# Patient Record
Sex: Male | Born: 1945 | Race: White | Hispanic: No | Marital: Married | Smoking: Former smoker
Health system: Southern US, Community
[De-identification: ages and names within clinical notes are randomized; demographics above are authoritative.]

## PROBLEM LIST (undated history)

## (undated) ENCOUNTER — Emergency Department (HOSPITAL_BASED_OUTPATIENT_CLINIC_OR_DEPARTMENT_OTHER): Admission: EM | Source: Home / Self Care

## (undated) DIAGNOSIS — G709 Myoneural disorder, unspecified: Secondary | ICD-10-CM

## (undated) DIAGNOSIS — M1A9XX0 Chronic gout, unspecified, without tophus (tophi): Secondary | ICD-10-CM

## (undated) DIAGNOSIS — C4490 Unspecified malignant neoplasm of skin, unspecified: Secondary | ICD-10-CM

## (undated) DIAGNOSIS — E079 Disorder of thyroid, unspecified: Secondary | ICD-10-CM

## (undated) DIAGNOSIS — Z974 Presence of external hearing-aid: Secondary | ICD-10-CM

## (undated) DIAGNOSIS — K429 Umbilical hernia without obstruction or gangrene: Secondary | ICD-10-CM

## (undated) DIAGNOSIS — E785 Hyperlipidemia, unspecified: Secondary | ICD-10-CM

## (undated) DIAGNOSIS — H9190 Unspecified hearing loss, unspecified ear: Secondary | ICD-10-CM

## (undated) DIAGNOSIS — N289 Disorder of kidney and ureter, unspecified: Secondary | ICD-10-CM

## (undated) DIAGNOSIS — R569 Unspecified convulsions: Secondary | ICD-10-CM

## (undated) DIAGNOSIS — K219 Gastro-esophageal reflux disease without esophagitis: Secondary | ICD-10-CM

## (undated) DIAGNOSIS — N2581 Secondary hyperparathyroidism of renal origin: Secondary | ICD-10-CM

## (undated) DIAGNOSIS — E291 Testicular hypofunction: Secondary | ICD-10-CM

## (undated) DIAGNOSIS — N183 Chronic kidney disease, stage 3 unspecified: Secondary | ICD-10-CM

## (undated) DIAGNOSIS — Z7901 Long term (current) use of anticoagulants: Secondary | ICD-10-CM

## (undated) DIAGNOSIS — Z973 Presence of spectacles and contact lenses: Secondary | ICD-10-CM

## (undated) DIAGNOSIS — I350 Nonrheumatic aortic (valve) stenosis: Secondary | ICD-10-CM

## (undated) DIAGNOSIS — M1990 Unspecified osteoarthritis, unspecified site: Secondary | ICD-10-CM

## (undated) DIAGNOSIS — I10 Essential (primary) hypertension: Secondary | ICD-10-CM

## (undated) DIAGNOSIS — E039 Hypothyroidism, unspecified: Secondary | ICD-10-CM

## (undated) DIAGNOSIS — C61 Malignant neoplasm of prostate: Secondary | ICD-10-CM

## (undated) DIAGNOSIS — E119 Type 2 diabetes mellitus without complications: Secondary | ICD-10-CM

## (undated) DIAGNOSIS — G4730 Sleep apnea, unspecified: Secondary | ICD-10-CM

## (undated) DIAGNOSIS — E038 Other specified hypothyroidism: Secondary | ICD-10-CM

## (undated) DIAGNOSIS — T7840XA Allergy, unspecified, initial encounter: Secondary | ICD-10-CM

## (undated) DIAGNOSIS — R011 Cardiac murmur, unspecified: Secondary | ICD-10-CM

## (undated) DIAGNOSIS — C801 Malignant (primary) neoplasm, unspecified: Secondary | ICD-10-CM

## (undated) DIAGNOSIS — Z85828 Personal history of other malignant neoplasm of skin: Secondary | ICD-10-CM

## (undated) DIAGNOSIS — G4733 Obstructive sleep apnea (adult) (pediatric): Secondary | ICD-10-CM

## (undated) DIAGNOSIS — D631 Anemia in chronic kidney disease: Secondary | ICD-10-CM

## (undated) DIAGNOSIS — E063 Autoimmune thyroiditis: Secondary | ICD-10-CM

## (undated) DIAGNOSIS — I491 Atrial premature depolarization: Secondary | ICD-10-CM

## (undated) DIAGNOSIS — Z8709 Personal history of other diseases of the respiratory system: Secondary | ICD-10-CM

## (undated) DIAGNOSIS — S76119A Strain of unspecified quadriceps muscle, fascia and tendon, initial encounter: Principal | ICD-10-CM

## (undated) DIAGNOSIS — J189 Pneumonia, unspecified organism: Secondary | ICD-10-CM

## (undated) DIAGNOSIS — M109 Gout, unspecified: Secondary | ICD-10-CM

## (undated) HISTORY — DX: Obstructive sleep apnea (adult) (pediatric): G47.33

## (undated) HISTORY — DX: Sleep apnea, unspecified: G47.30

## (undated) HISTORY — DX: Malignant (primary) neoplasm, unspecified: C80.1

## (undated) HISTORY — DX: Allergy, unspecified, initial encounter: T78.40XA

## (undated) HISTORY — PX: TONSILLECTOMY: SUR1361

## (undated) HISTORY — PX: KNEE SURGERY: SHX244

## (undated) HISTORY — DX: Type 2 diabetes mellitus without complications: E11.9

## (undated) HISTORY — DX: Myoneural disorder, unspecified: G70.9

## (undated) HISTORY — PX: COLONOSCOPY: SHX174

## (undated) HISTORY — DX: Strain of unspecified quadriceps muscle, fascia and tendon, initial encounter: S76.119A

## (undated) HISTORY — DX: Unspecified osteoarthritis, unspecified site: M19.90

## (undated) HISTORY — DX: Cardiac murmur, unspecified: R01.1

## (undated) HISTORY — DX: Personal history of other malignant neoplasm of skin: Z85.828

## (undated) HISTORY — PX: PROSTATE BIOPSY: SHX241

## (undated) HISTORY — PX: KNEE ARTHROPLASTY: SHX992

---
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
## 1968-05-17 DIAGNOSIS — Z8619 Personal history of other infectious and parasitic diseases: Secondary | ICD-10-CM

## 1968-05-17 HISTORY — DX: Personal history of other infectious and parasitic diseases: Z86.19

## 1997-05-17 HISTORY — PX: KNEE ARTHROSCOPY: SUR90

## 1998-05-17 DIAGNOSIS — Z87898 Personal history of other specified conditions: Secondary | ICD-10-CM

## 1998-05-17 DIAGNOSIS — R569 Unspecified convulsions: Secondary | ICD-10-CM

## 1998-05-17 HISTORY — DX: Personal history of other specified conditions: Z87.898

## 1998-05-17 HISTORY — DX: Unspecified convulsions: R56.9

## 2005-01-23 ENCOUNTER — Emergency Department (HOSPITAL_COMMUNITY): Admission: EM | Admit: 2005-01-23 | Discharge: 2005-01-23 | Payer: Self-pay | Admitting: Emergency Medicine

## 2005-02-04 ENCOUNTER — Encounter: Admission: RE | Admit: 2005-02-04 | Discharge: 2005-02-04 | Payer: Self-pay | Admitting: Nephrology

## 2005-05-17 DIAGNOSIS — Z9889 Other specified postprocedural states: Secondary | ICD-10-CM

## 2005-05-17 DIAGNOSIS — Z85828 Personal history of other malignant neoplasm of skin: Secondary | ICD-10-CM

## 2005-05-17 HISTORY — DX: Personal history of other malignant neoplasm of skin: Z98.890

## 2005-05-17 HISTORY — DX: Personal history of other malignant neoplasm of skin: Z85.828

## 2005-06-03 ENCOUNTER — Emergency Department (HOSPITAL_COMMUNITY): Admission: EM | Admit: 2005-06-03 | Discharge: 2005-06-03 | Payer: Self-pay | Admitting: Family Medicine

## 2007-09-13 ENCOUNTER — Ambulatory Visit: Payer: Self-pay | Admitting: Internal Medicine

## 2007-10-19 ENCOUNTER — Telehealth (INDEPENDENT_AMBULATORY_CARE_PROVIDER_SITE_OTHER): Payer: Self-pay | Admitting: *Deleted

## 2007-10-30 ENCOUNTER — Telehealth: Payer: Self-pay | Admitting: Internal Medicine

## 2007-10-31 ENCOUNTER — Telehealth (INDEPENDENT_AMBULATORY_CARE_PROVIDER_SITE_OTHER): Payer: Self-pay | Admitting: *Deleted

## 2007-12-01 ENCOUNTER — Ambulatory Visit: Payer: Self-pay | Admitting: Internal Medicine

## 2008-02-06 ENCOUNTER — Encounter: Admission: RE | Admit: 2008-02-06 | Discharge: 2008-02-06 | Payer: Self-pay | Admitting: "Endocrinology

## 2008-10-15 ENCOUNTER — Ambulatory Visit: Payer: Self-pay | Admitting: Internal Medicine

## 2008-10-25 ENCOUNTER — Ambulatory Visit: Payer: Self-pay | Admitting: Internal Medicine

## 2008-10-25 ENCOUNTER — Encounter: Payer: Self-pay | Admitting: Internal Medicine

## 2008-10-28 ENCOUNTER — Encounter: Payer: Self-pay | Admitting: Internal Medicine

## 2009-03-26 ENCOUNTER — Ambulatory Visit: Payer: Self-pay | Admitting: Vascular Surgery

## 2009-06-19 ENCOUNTER — Encounter: Admission: RE | Admit: 2009-06-19 | Discharge: 2009-06-19 | Payer: Self-pay | Admitting: Endocrinology

## 2009-10-01 ENCOUNTER — Encounter: Admission: RE | Admit: 2009-10-01 | Discharge: 2009-10-01 | Payer: Self-pay | Admitting: "Endocrinology

## 2009-10-03 ENCOUNTER — Emergency Department (HOSPITAL_COMMUNITY): Admission: EM | Admit: 2009-10-03 | Discharge: 2009-10-03 | Payer: Self-pay | Admitting: Family Medicine

## 2010-06-07 ENCOUNTER — Encounter: Payer: Self-pay | Admitting: Endocrinology

## 2010-08-03 LAB — DIFFERENTIAL
Basophils Absolute: 0 10*3/uL (ref 0.0–0.1)
Eosinophils Absolute: 0.1 10*3/uL (ref 0.0–0.7)
Eosinophils Relative: 1 % (ref 0–5)
Lymphocytes Relative: 30 % (ref 12–46)
Monocytes Absolute: 0.5 10*3/uL (ref 0.1–1.0)
Monocytes Relative: 7 % (ref 3–12)
Neutro Abs: 4.8 10*3/uL (ref 1.7–7.7)

## 2010-08-03 LAB — COMPREHENSIVE METABOLIC PANEL
AST: 24 U/L (ref 0–37)
Albumin: 4.1 g/dL (ref 3.5–5.2)
Alkaline Phosphatase: 47 U/L (ref 39–117)
Calcium: 9.1 mg/dL (ref 8.4–10.5)
GFR calc Af Amer: 53 mL/min — ABNORMAL LOW (ref >60)
Glucose, Bld: 103 mg/dL — ABNORMAL HIGH (ref 70–99)
Sodium: 138 mEq/L (ref 135–145)
Total Bilirubin: 0.6 mg/dL (ref 0.3–1.2)
Total Protein: 6.8 g/dL (ref 6.0–8.3)

## 2010-08-03 LAB — URINALYSIS, ROUTINE W REFLEX MICROSCOPIC
Bilirubin Urine: NEGATIVE
Glucose, UA: NEGATIVE mg/dL
Hgb urine dipstick: NEGATIVE
Protein, ur: NEGATIVE mg/dL
Urobilinogen, UA: 0.2 mg/dL (ref 0.0–1.0)
pH: 6.5 (ref 5.0–8.0)

## 2010-08-03 LAB — CBC
HCT: 48.5 % (ref 39.0–52.0)
Hemoglobin: 16.1 g/dL (ref 13.0–17.0)
MCHC: 33.3 g/dL (ref 30.0–36.0)
MCV: 96.2 fL (ref 78.0–100.0)

## 2010-08-03 LAB — GLUCOSE, CAPILLARY

## 2010-08-24 LAB — GLUCOSE, CAPILLARY
Glucose-Capillary: 101 mg/dL — ABNORMAL HIGH (ref 70–99)
Glucose-Capillary: 81 mg/dL (ref 70–99)

## 2010-09-29 NOTE — Procedures (Signed)
DUPLEX DEEP VENOUS EXAM - LOWER EXTREMITY   INDICATION:  Left lower extremity pain and swelling.   HISTORY:  Edema:  Yes.  Trauma/Surgery:  No.  Pain:  Yes.  PE:  No.  Previous DVT:  No.  Anticoagulants:  81 mg aspirin.  Other:   DUPLEX EXAM:                CFV   SFV   PopV  PTV    GSV                R  L  R  L  R  L  R   L  R  L  Thrombosis    0  0     0     0      0     0  Spontaneous   +  +     +     +      +     +  Phasic        +  +     +     +      +     +  Augmentation  +  +     +     +      +     +  Compressible  +  +     +     +      +     +  Competent     +  +     +     +      +     +   Legend:  + - yes  o - no  p - partial  D - decreased   IMPRESSION:  1. No evidence of deep venous thrombosis noted in the left leg.  2. Notified Dr. Casimiro Needle Altheimer with the results.    _____________________________  Janetta Hora Fields, MD   MG/MEDQ  D:  03/26/2009  T:  03/27/2009  Job:  [id number]

## 2010-09-29 NOTE — Assessment & Plan Note (Signed)
[facility]                         GASTROENTEROLOGY OFFICE NOTE   NAME:[name]                    MRN:          [id number]  DATE:09/13/2007                            DOB:          10-28-1945    REFERRING PHYSICIAN:  Veverly Fells. Altheimer, M.D.   REASON FOR CONSULTATION:  Screening colonoscopy.   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  He has a  history of hypertension, type II diabetes mellitus, hyperlipidemia,  hypothyroidism, osteoarthritis and mild renal insufficiency.  GI history  is remarkable for gastroesophageal reflux disease and remote peptic  ulcer disease, for which he is on Nexium.  He reports to me having had a  routine colonoscopy in 1999.  No abnormalities to report.  He is  interested in routine follow up screening.  He is accompanied today by  his wife, [name].  She has concerns over alternating bowel habits which  have occured over the past 5-6 years.  Specifically, three to four times  per week he may experience postprandial urgency with loose stools.  Occasional mild bloating.  Thereafter, he may go several days without  defecating.  He denies abdominal pain, melena, hematochezia or weight  loss.  On Nexium, no true dyspeptic symptoms are reported.  No  dysphagia.  He apparently had a prior upper endoscopy which was also  negative.  In particular, no Barrett's esophagus.  He has not been  tested for celiac sprue.   ALLERGIES:  1. ZANTAC, RESULTING IN DYSTONIC REACTION.  2. NONSTEROIDAL ANTIINFLAMMATORY DRUGS, RESULTING IN GASTROINTESTINAL      UPSET.   CURRENT MEDICATIONS:  1. Nexium 40 mg daily.  2. Synthroid 137 mcg daily.  3. Lipitor 80 mg daily.  4. Trilipix 130 mg daily.  5. Zetia 10 mg daily.  6. Byetta 5 mcg b.i.d.  7. Janumet 5/500 mg b.i.d.  8. AndroGel pump six times daily.  9. Allopurinol 300 mg daily.  10.Rogaine 5% b.i.d.  11.Micardis/HCTZ 40/12.5 mg daily.  12.Mylanta p.r.n.  13.Gaviscon p.r.n.  14.Tylenol arthritis p.r.n.   PAST MEDICAL HISTORY:  1. Hypertension.  2. Type II diabetes mellitus.  3. Hyperlipidemia.  4. Hypothyroidism.  5. Osteoarthritis.  6. Renal insufficiency.  7. Basal cell carcinoma of the nose status post MOHS surgery in 2007.  8. History of epididymitis.  9. Prostatitis.  10.Benign prostatic hypertrophy.  11.Cervical neuritis.  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  15.Hyperuricemia.  16.Presbyopia.   PAST SURGICAL HISTORY:  1. MOHS surgery for basal cell carcinoma.  2. History of left knee arthroscopy.  3. Facial reconstruction due to trauma in 1965.  4. Sustained a shrapnel wound to the right side of the face while in      [place] in 1970.   FAMILY HISTORY:  Father with chronic renal insufficiency.  Two maternal  uncles with type 2 diabetes.  Mother died of myocardial infarction.  No  family history of gastrointestinal malignancy.   SOCIAL HISTORY:  The patient is married.  Two sons by his previous  marriage and  2 stepsons, as well as 4 daughters-in-law and 5  grandchildren.  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  Incidentally, he cares for my daughter  regarding her diabetes.  He smoked remotely, but has not since 1974.  He  consumes 1-2 alcoholic beverages per day.   REVIEW OF SYSTEMS:  Per diagnostic evaluation form.  The patient  provided a history sheet.   PHYSICAL EXAMINATION:  GENERAL:  A well-appearing male in no acute  distress.  VITAL SIGNS:  Blood pressure is 120/68, heart rate of 60 and regular,  respirations 16 and unlabored.  His weight is 180.6 pounds.  He is 5  feet, 5 inches in height.  HEENT:  Sclerae are anicteric.  Conjunctivae are pink.  Oral mucosa  intact.  NECK:  Supple with normal thyroid.  No adenopathy.  LUNGS:  Clear.   HEART:  Regular without obvious murmur.  ABDOMEN:  Soft and nontender with good bowel sounds.  No organomegaly or  mass.  EXTREMITIES:  Without edema.   IMPRESSION:  1. Screening colonoscopy.  He is an appropriate candidate without      contraindication.  The nature of the procedure, as well as the      risks, benefits, and alternatives were reviewed.  He understood and      agreed to proceed.  2. Chronic history of mild alternating bowel habits, most consistent      with mild irritable bowel syndrome.  3. Gastroesophageal reflux disease with negative prior endoscopy.  4. Remote history of peptic ulcer disease.  5. Multiple general medical problems as outlined above, and under the      care of Dr. Leslie Dales.   RECOMMENDATIONS:  1. Continue proton pump inhibitor therapy.  2. Empiric trial of the probiotic Align, one daily for two weeks, to      see if this helps with bowel symptoms.  3. Schedule colonoscopy at the patient's convenience.  He will adjust      his diabetic medications to avoid hypoglycemia.     [name]. [name], MD  Electronically Signed    JNP/MedQ  DD: 09/13/2007  DT: 09/13/2007  Job #: [id number]   cc:   Veverly Fells. Altheimer, M.D.

## 2011-06-01 ENCOUNTER — Emergency Department (HOSPITAL_COMMUNITY)
Admission: EM | Admit: 2011-06-01 | Discharge: 2011-06-02 | Disposition: A | Discharge status: Home / Self Care | Payer: 59 | Attending: Emergency Medicine | Admitting: Emergency Medicine

## 2011-06-01 ENCOUNTER — Emergency Department (HOSPITAL_COMMUNITY): Payer: 59

## 2011-06-01 ENCOUNTER — Encounter (HOSPITAL_COMMUNITY): Payer: Self-pay | Admitting: *Deleted

## 2011-06-01 DIAGNOSIS — R1031 Right lower quadrant pain: Secondary | ICD-10-CM | POA: Insufficient documentation

## 2011-06-01 DIAGNOSIS — Z79899 Other long term (current) drug therapy: Secondary | ICD-10-CM | POA: Insufficient documentation

## 2011-06-01 DIAGNOSIS — M109 Gout, unspecified: Secondary | ICD-10-CM | POA: Insufficient documentation

## 2011-06-01 DIAGNOSIS — N289 Disorder of kidney and ureter, unspecified: Secondary | ICD-10-CM | POA: Insufficient documentation

## 2011-06-01 DIAGNOSIS — Z7982 Long term (current) use of aspirin: Secondary | ICD-10-CM | POA: Insufficient documentation

## 2011-06-01 DIAGNOSIS — I10 Essential (primary) hypertension: Secondary | ICD-10-CM | POA: Insufficient documentation

## 2011-06-01 DIAGNOSIS — R110 Nausea: Secondary | ICD-10-CM | POA: Insufficient documentation

## 2011-06-01 DIAGNOSIS — K219 Gastro-esophageal reflux disease without esophagitis: Secondary | ICD-10-CM | POA: Insufficient documentation

## 2011-06-01 DIAGNOSIS — E785 Hyperlipidemia, unspecified: Secondary | ICD-10-CM | POA: Insufficient documentation

## 2011-06-01 DIAGNOSIS — N451 Epididymitis: Secondary | ICD-10-CM

## 2011-06-01 DIAGNOSIS — E079 Disorder of thyroid, unspecified: Secondary | ICD-10-CM | POA: Insufficient documentation

## 2011-06-01 DIAGNOSIS — N453 Epididymo-orchitis: Secondary | ICD-10-CM | POA: Insufficient documentation

## 2011-06-01 DIAGNOSIS — E119 Type 2 diabetes mellitus without complications: Secondary | ICD-10-CM | POA: Insufficient documentation

## 2011-06-01 HISTORY — DX: Essential (primary) hypertension: I10

## 2011-06-01 HISTORY — DX: Disorder of thyroid, unspecified: E07.9

## 2011-06-01 HISTORY — DX: Hyperlipidemia, unspecified: E78.5

## 2011-06-01 HISTORY — DX: Unspecified convulsions: R56.9

## 2011-06-01 HISTORY — DX: Disorder of kidney and ureter, unspecified: N28.9

## 2011-06-01 HISTORY — DX: Gastro-esophageal reflux disease without esophagitis: K21.9

## 2011-06-01 LAB — CBC
HCT: 46.6 % (ref 39.0–52.0)
MCH: 31.4 pg (ref 26.0–34.0)
MCV: 91 fL (ref 78.0–100.0)
Platelets: 263 10*3/uL (ref 150–400)
RDW: 14 % (ref 11.5–15.5)
WBC: 9.3 10*3/uL (ref 4.0–10.5)

## 2011-06-01 LAB — COMPREHENSIVE METABOLIC PANEL
ALT: 23 U/L (ref 0–53)
Albumin: 4.1 g/dL (ref 3.5–5.2)
BUN: 28 mg/dL — ABNORMAL HIGH (ref 6–23)
CO2: 25 mEq/L (ref 19–32)
Chloride: 100 mEq/L (ref 96–112)
Creatinine, Ser: 1.44 mg/dL — ABNORMAL HIGH (ref 0.50–1.35)
Glucose, Bld: 90 mg/dL (ref 70–99)
Potassium: 3.6 mEq/L (ref 3.5–5.1)
Sodium: 136 mEq/L (ref 135–145)

## 2011-06-01 LAB — URINALYSIS, ROUTINE W REFLEX MICROSCOPIC
Glucose, UA: NEGATIVE mg/dL
Protein, ur: NEGATIVE mg/dL
Specific Gravity, Urine: 1.022 (ref 1.005–1.030)
Urobilinogen, UA: 0.2 mg/dL (ref 0.0–1.0)
pH: 6.5 (ref 5.0–8.0)

## 2011-06-01 LAB — LIPASE, BLOOD: Lipase: 46 U/L (ref 11–59)

## 2011-06-01 LAB — DIFFERENTIAL
Eosinophils Absolute: 0.1 10*3/uL (ref 0.0–0.7)
Neutrophils Relative %: 63 % (ref 43–77)

## 2011-06-01 MED ORDER — SODIUM CHLORIDE 0.9 % IV BOLUS (SEPSIS)
1000.0000 mL | Freq: Once | INTRAVENOUS | Status: AC
  Administered 2011-06-01: 1000 mL via INTRAVENOUS

## 2011-06-01 MED ORDER — SODIUM CHLORIDE 0.9 % IV SOLN: INTRAVENOUS | Status: DC

## 2011-06-01 MED ORDER — MORPHINE SULFATE 4 MG/ML IJ SOLN: 4.0000 mg | Freq: Once | INTRAMUSCULAR | Status: DC

## 2011-06-01 MED ORDER — ONDANSETRON HCL 4 MG/2ML IJ SOLN
4.0000 mg | Freq: Once | INTRAMUSCULAR | Status: AC
  Administered 2011-06-01: 4 mg via INTRAVENOUS

## 2011-06-01 NOTE — ED Notes (Signed)
MD at bedside. Dr. Knapp at bedside.  

## 2011-06-01 NOTE — ED Notes (Signed)
Bed:WA07<BR> Expected date:<BR> Expected time:<BR> Means of arrival:<BR> Comments:<BR> Hold for triage

## 2011-06-01 NOTE — ED Notes (Signed)
Made patient aware that we needed a urine sample and he stated that he is unable to go at this time he will call when he is able to go.

## 2011-06-01 NOTE — ED Provider Notes (Signed)
History     CSN: [id number]  Arrival date & time 06/01/11  2055   First MD Initiated Contact with Patient 06/01/11 2117      Chief Complaint  Patient presents with  . Abdominal Pain    (Consider location/radiation/quality/duration/timing/severity/associated sxs/prior treatment) HPI  Patient relates he's been working long hours because of a new computer system at work and has been working up to 18 hours a day. He relates about 6:30 this evening he was at work and he started getting a right lower quadrant pain that he describes as a twinge it would last a few seconds and come and go. He had nausea without vomiting. He relates a few times the pain got so intense that made him fall down. He denies flank pain, dysuria, fever. He states he's had epididymitis and prostatitis before but he states this pain is in a new location the he's never had before. He denies any change in activities such as heavy lifting or any change in his diet. He relates he's never had any surgical procedures on his abdomen.  PCP Dr. Odelia Gage  Past Medical History  Diagnosis Date  . Thyroid disease   . Hyperlipemia   . Hypertension   . Diabetes mellitus   . Gout   . GERD (gastroesophageal reflux disease)   . Seizures   . Renal disorder     Past Surgical History  Procedure Date  . Knee surgery   . Facial fracture surgery     History reviewed. No pertinent family history.  History  Substance Use Topics  . Smoking status: Never Smoker   . Smokeless tobacco: Not on file  . Alcohol Use: Yes   lives with spouse Patient is a practicing physician    Review of Systems  All other systems reviewed and are negative.    Allergies  Nsaids and Ranitidine hcl  Home Medications   Current Outpatient Rx  Name Route Sig Dispense Refill  . ACETAMINOPHEN 500 MG PO TABS Oral Take 1,000 mg by mouth daily. For pain.    Marland Kitchen ALLOPURINOL 300 MG PO TABS Oral Take 300 mg by mouth daily.    . ASPIRIN EC 81 MG PO TBEC  Oral Take 81 mg by mouth daily.    . ATORVASTATIN CALCIUM 80 MG PO TABS Oral Take 80 mg by mouth daily.    . CHOLINE FENOFIBRATE 135 MG PO CPDR Oral Take 135 mg by mouth daily.    Marland Kitchen ESOMEPRAZOLE MAGNESIUM 40 MG PO CPDR Oral Take 40 mg by mouth daily before breakfast.    . EXENATIDE 5 MCG/0.02ML [place] SOLN Subcutaneous Inject 5 mcg into the skin daily.    Marland Kitchen EZETIMIBE 10 MG PO TABS Oral Take 10 mg by mouth daily.    Marland Kitchen LEVOTHYROXINE SODIUM 137 MCG PO TABS Oral Take 137 mcg by mouth daily.    Marland Kitchen SITAGLIPTIN-METFORMIN HCL 50-500 MG PO TABS Oral Take 1 tablet by mouth daily.    . TELMISARTAN-HCTZ 40-12.5 MG PO TABS Oral Take 1 tablet by mouth daily.    . TESTOSTERONE 20.25 MG/ACT (1.62%) TD GEL Transdermal Place 8 application onto the skin daily.    Marland Kitchen VITAMIN D (ERGOCALCIFEROL) 50000 UNITS PO CAPS Oral Take 50,000 Units by mouth every 7 (seven) days. Taken on Sunday.      BP 141/77  Pulse 67  Temp(Src) 98.6 F (37 C) (Oral)  Resp 20  SpO2 99% Vital signs normal    Physical Exam  Nursing note and vitals reviewed.  Constitutional: He is oriented to person, place, and time. He appears well-developed and well-nourished.  Non-toxic appearance. He does not appear ill. No distress.  HENT:  Head: Normocephalic and atraumatic.  Right Ear: External ear normal.  Left Ear: External ear normal.  Nose: Nose normal. No mucosal edema or rhinorrhea.  Mouth/Throat: Oropharynx is clear and moist and mucous membranes are normal. No dental abscesses or uvula swelling.  Eyes: Conjunctivae and EOM are normal. Pupils are equal, round, and reactive to light.  Neck: Normal range of motion and full passive range of motion without pain. Neck supple.  Cardiovascular: Normal rate, regular rhythm and normal heart sounds.  Exam reveals no gallop and no friction rub.   No murmur heard. Pulmonary/Chest: Effort normal and breath sounds normal. No respiratory distress. He has no wheezes. He has no rhonchi. He has no rales. He  exhibits no tenderness and no crepitus.  Abdominal: Soft. Normal appearance and bowel sounds are normal. He exhibits no distension. There is no tenderness. There is no rebound and no guarding.         Area of pain indicated that he denies pain to palpation  Genitourinary:       Patient has some tenderness over the right epididymis but he states that is not his worse pain.  Musculoskeletal: Normal range of motion. He exhibits no edema and no tenderness.       Moves all extremities well.   Neurological: He is alert and oriented to person, place, and time. He has normal strength. No cranial nerve deficit.  Skin: Skin is warm, dry and intact. No rash noted. No erythema. No pallor.  Psychiatric: He has a normal mood and affect. His speech is normal and behavior is normal. His mood appears not anxious.    ED Course  Procedures (including critical care time)  IV fluids and IV pain and nausea medicine were written however patient did not want to get pain medicine at this time.  Peliminary [place] report, no acute changes, cyst on left and ? Scar tissue from epididymus. Pt doesn't want to wait for official reading by radiologist.  Results for orders placed during the hospital encounter of 06/01/11  CBC      Component Value Range   WBC 9.3  4.0 - 10.5 (K/uL)   RBC 5.12  4.22 - 5.81 (MIL/uL)   Hemoglobin 16.1  13.0 - 17.0 (g/dL)   HCT 45.4  09.8 - 11.9 (%)   MCV 91.0  78.0 - 100.0 (fL)   MCH 31.4  26.0 - 34.0 (pg)   MCHC 34.5  30.0 - 36.0 (g/dL)   RDW 14.7  82.9 - 56.2 (%)   Platelets 263  150 - 400 (K/uL)  DIFFERENTIAL      Component Value Range   Neutrophils Relative 63  43 - 77 (%)   Neutro Abs 5.9  1.7 - 7.7 (K/uL)   Lymphocytes Relative 30  12 - 46 (%)   Lymphs Abs 2.8  0.7 - 4.0 (K/uL)   Monocytes Relative 6  3 - 12 (%)   Monocytes Absolute 0.5  0.1 - 1.0 (K/uL)   Eosinophils Relative 1  0 - 5 (%)   Eosinophils Absolute 0.1  0.0 - 0.7 (K/uL)   Basophils Relative 0  0 - 1 (%)    Basophils Absolute 0.0  0.0 - 0.1 (K/uL)  COMPREHENSIVE METABOLIC PANEL      Component Value Range   Sodium 136  135 - 145 (mEq/L)  Potassium 3.6  3.5 - 5.1 (mEq/L)   Chloride 100  96 - 112 (mEq/L)   CO2 25  19 - 32 (mEq/L)   Glucose, Bld 90  70 - 99 (mg/dL)   BUN 28 (*) 6 - 23 (mg/dL)   Creatinine, Ser 1.19 (*) 0.50 - 1.35 (mg/dL)   Calcium 9.6  8.4 - 14.7 (mg/dL)   Total Protein 7.0  6.0 - 8.3 (g/dL)   Albumin 4.1  3.5 - 5.2 (g/dL)   AST 17  0 - 37 (U/L)   ALT 23  0 - 53 (U/L)   Alkaline Phosphatase 42  39 - 117 (U/L)   Total Bilirubin 0.4  0.3 - 1.2 (mg/dL)   GFR calc non Af Amer 49 (*) >90 (mL/min)   GFR calc Af Amer 57 (*) >90 (mL/min)  LIPASE, BLOOD      Component Value Range   Lipase 46  11 - 59 (U/L)  URINALYSIS, ROUTINE W REFLEX MICROSCOPIC      Component Value Range   Color, Urine YELLOW  YELLOW    APPearance CLEAR  CLEAR    Specific Gravity, Urine 1.022  1.005 - 1.030    pH 6.5  5.0 - 8.0    Glucose, UA NEGATIVE  NEGATIVE (mg/dL)   Hgb urine dipstick NEGATIVE  NEGATIVE    Bilirubin Urine NEGATIVE  NEGATIVE    Ketones, ur NEGATIVE  NEGATIVE (mg/dL)   Protein, ur NEGATIVE  NEGATIVE (mg/dL)   Urobilinogen, UA 0.2  0.0 - 1.0 (mg/dL)   Nitrite NEGATIVE  NEGATIVE    Leukocytes, UA NEGATIVE  NEGATIVE    Laboratory interpretation all normal except renal insuffic   Ct Abdomen Pelvis Wo Contrast  06/01/2011  *RADIOLOGY REPORT*  Clinical Data: Right abdominal flank pain  CT ABDOMEN AND PELVIS WITHOUT CONTRAST  Technique:  Multidetector CT imaging of the abdomen and pelvis was performed following the standard protocol without intravenous contrast.  Comparison: 06/01/2011, 10/03/2009  Findings: Minimal inferior lingula scarring.  Lung bases otherwise clear.  Normal heart size.  No pericardial or pleural effusion.  Abdomen:  Diffuse hypoattenuation of the liver parenchyma compatible with hepatic steatosis (fatty infiltration).  No focal hepatic abnormality or biliary dilatation  by noncontrast CT.  Gallbladder, biliary system, pancreas, spleen, and adrenal glands are within normal limits for noncontrast study.  Kidneys demonstrate no evidence of acute perinephric inflammation, obstructive uropathy, or hydronephrosis.  No obstructing urinary tract or ureteral calculus on either side.  Atherosclerosis of the aorta without aneurysm or acute retroperitoneal hemorrhage.  No bowel obstruction pattern, dilatation, ileus, or free air.  No abdominal free fluid, fluid collection, hemorrhage, adenopathy, or abscess.  Pelvis:  Urinary bladder unremarkable.  No pelvic free fluid, fluid collection, hemorrhage, abscess, adenopathy, inguinal abnormality, or hernia.  Vascular calcifications evident.  Scattered mild colonic diverticulosis.  Degenerative changes noted of the spine and pelvis.  IMPRESSION: Negative for acute obstructing urinary tract ureteral calculus.  Hepatic steatosis  Diverticulosis  No acute intra-abdominal or pelvic finding by noncontrast CT.  Original Report Authenticated By: Judie Petit. Ruel Favors, M.D.   Dg Abd Acute W/chest  06/01/2011  *RADIOLOGY REPORT*  Clinical Data: Abdominal pain, nausea  ACUTE ABDOMEN SERIES (ABDOMEN 2 VIEW & CHEST 1 VIEW)  Comparison: 06/19/2009, 10/01/2009  Findings: Remote healed left mid rib fracture.  Normal heart size and vascularity.  Negative for pneumonia, edema, effusion or pneumothorax.  No consolidation.  Trachea midline.  No free air. Scattered air and stool throughout the bowel without obstruction  or ileus.  Degenerative changes of the spine with mild curvature. Pelvic calcifications likely venous phleboliths.  No acute osseous finding.  IMPRESSION: No acute finding by plain radiography.  Negative for obstruction or free air.  Original Report Authenticated By: Judie Petit. Ruel Favors, M.D.     1. Epididymitis, right     New Prescriptions   HYDROCODONE-ACETAMINOPHEN (NORCO) 5-325 MG PER TABLET    Take 1 or 2 po every 6 hrs for pain   LEVOFLOXACIN  (LEVAQUIN) 500 MG TABLET    Take 1 tablet (500 mg total) by mouth daily.   ONDANSETRON (ZOFRAN) 4 MG TABLET    Take 1 tablet (4 mg total) by mouth every 6 (six) hours.   TRAMADOL-ACETAMINOPHEN (ULTRACET) 37.5-325 MG PER TABLET    2 tabs po QID prn pain   Plan discharge  Devoria Albe, MD, Armando Gang   MDM          Ward Givens, MD 06/04/11 [phone]

## 2011-06-01 NOTE — ED Notes (Signed)
Pt in c/o RLQ pain x2 hours with nausea, denies vomiting or diarrhea, states pain has increased since starting, pain is intermittent

## 2011-06-01 NOTE — ED Notes (Signed)
Error. Pt not given Zofran. Pt states he is not nauseous and denies need for Zofran at this time.

## 2011-06-02 MED ORDER — TRAMADOL-ACETAMINOPHEN 37.5-325 MG PO TABS: ORAL_TABLET | ORAL | Status: AC

## 2011-06-02 MED ORDER — HYDROCODONE-ACETAMINOPHEN 5-325 MG PO TABS: ORAL_TABLET | ORAL | Status: DC

## 2011-06-02 MED ORDER — ONDANSETRON HCL 4 MG PO TABS: 4.0000 mg | ORAL_TABLET | Freq: Four times a day (QID) | ORAL | Status: AC

## 2011-06-02 MED ORDER — LEVOFLOXACIN 500 MG PO TABS
500.0000 mg | ORAL_TABLET | Freq: Every day | ORAL | Status: DC
  Administered 2011-06-02: 500 mg via ORAL

## 2011-06-02 MED ORDER — LEVOFLOXACIN 500 MG PO TABS: 500.0000 mg | ORAL_TABLET | Freq: Every day | ORAL | Status: AC

## 2011-06-02 NOTE — ED Notes (Signed)
Went in to get vitals but ultrasound was in the room will return when she finish.

## 2011-06-02 NOTE — ED Notes (Signed)
MD at bedside. 

## 2011-06-02 NOTE — ED Notes (Signed)
US tech at bedside

## 2011-06-07 ENCOUNTER — Telehealth: Payer: Self-pay | Admitting: "Endocrinology

## 2011-06-07 NOTE — Telephone Encounter (Signed)
Dr. Lynelle Doctor called to make sure that I have a FU appointment with Urology. I replied that I do. David Stall

## 2011-06-09 ENCOUNTER — Other Ambulatory Visit: Payer: Self-pay | Admitting: Dermatology

## 2011-06-09 DIAGNOSIS — D239 Other benign neoplasm of skin, unspecified: Secondary | ICD-10-CM | POA: Diagnosis not present

## 2011-06-09 DIAGNOSIS — L821 Other seborrheic keratosis: Secondary | ICD-10-CM | POA: Diagnosis not present

## 2011-06-09 DIAGNOSIS — D485 Neoplasm of uncertain behavior of skin: Secondary | ICD-10-CM | POA: Diagnosis not present

## 2011-07-07 DIAGNOSIS — N429 Disorder of prostate, unspecified: Secondary | ICD-10-CM | POA: Diagnosis not present

## 2011-07-07 DIAGNOSIS — N453 Epididymo-orchitis: Secondary | ICD-10-CM | POA: Diagnosis not present

## 2011-07-07 DIAGNOSIS — E291 Testicular hypofunction: Secondary | ICD-10-CM | POA: Diagnosis not present

## 2011-09-10 DIAGNOSIS — E119 Type 2 diabetes mellitus without complications: Secondary | ICD-10-CM | POA: Diagnosis not present

## 2011-09-10 DIAGNOSIS — E559 Vitamin D deficiency, unspecified: Secondary | ICD-10-CM | POA: Diagnosis not present

## 2011-09-10 DIAGNOSIS — E291 Testicular hypofunction: Secondary | ICD-10-CM | POA: Diagnosis not present

## 2011-09-10 DIAGNOSIS — I10 Essential (primary) hypertension: Secondary | ICD-10-CM | POA: Diagnosis not present

## 2011-09-10 DIAGNOSIS — E782 Mixed hyperlipidemia: Secondary | ICD-10-CM | POA: Diagnosis not present

## 2011-09-10 DIAGNOSIS — E039 Hypothyroidism, unspecified: Secondary | ICD-10-CM | POA: Diagnosis not present

## 2011-09-13 ENCOUNTER — Emergency Department
Admit: 2011-09-13 | Discharge: 2011-09-13 | Disposition: A | Discharge status: Home / Self Care | Payer: Self-pay | Source: Emergency Department

## 2011-09-13 DIAGNOSIS — S93609A Unspecified sprain of unspecified foot, initial encounter: Secondary | ICD-10-CM | POA: Diagnosis not present

## 2011-09-13 DIAGNOSIS — E119 Type 2 diabetes mellitus without complications: Secondary | ICD-10-CM | POA: Diagnosis not present

## 2011-09-13 DIAGNOSIS — E782 Mixed hyperlipidemia: Secondary | ICD-10-CM | POA: Diagnosis not present

## 2011-09-13 DIAGNOSIS — K219 Gastro-esophageal reflux disease without esophagitis: Secondary | ICD-10-CM | POA: Diagnosis not present

## 2011-09-13 DIAGNOSIS — E039 Hypothyroidism, unspecified: Secondary | ICD-10-CM | POA: Diagnosis not present

## 2011-09-13 DIAGNOSIS — M109 Gout, unspecified: Secondary | ICD-10-CM | POA: Diagnosis not present

## 2011-09-30 DIAGNOSIS — S93609A Unspecified sprain of unspecified foot, initial encounter: Secondary | ICD-10-CM | POA: Diagnosis not present

## 2011-11-01 DIAGNOSIS — S239XXA Sprain of unspecified parts of thorax, initial encounter: Secondary | ICD-10-CM | POA: Diagnosis not present

## 2011-11-01 DIAGNOSIS — S92309A Fracture of unspecified metatarsal bone(s), unspecified foot, initial encounter for closed fracture: Secondary | ICD-10-CM | POA: Diagnosis not present

## 2011-12-03 DIAGNOSIS — E782 Mixed hyperlipidemia: Secondary | ICD-10-CM | POA: Diagnosis not present

## 2011-12-03 DIAGNOSIS — E119 Type 2 diabetes mellitus without complications: Secondary | ICD-10-CM | POA: Diagnosis not present

## 2011-12-03 DIAGNOSIS — E291 Testicular hypofunction: Secondary | ICD-10-CM | POA: Diagnosis not present

## 2011-12-03 DIAGNOSIS — I10 Essential (primary) hypertension: Secondary | ICD-10-CM | POA: Diagnosis not present

## 2011-12-03 DIAGNOSIS — E559 Vitamin D deficiency, unspecified: Secondary | ICD-10-CM | POA: Diagnosis not present

## 2011-12-03 DIAGNOSIS — E039 Hypothyroidism, unspecified: Secondary | ICD-10-CM | POA: Diagnosis not present

## 2011-12-06 DIAGNOSIS — IMO0001 Reserved for inherently not codable concepts without codable children: Secondary | ICD-10-CM | POA: Diagnosis not present

## 2011-12-06 DIAGNOSIS — S92309A Fracture of unspecified metatarsal bone(s), unspecified foot, initial encounter for closed fracture: Secondary | ICD-10-CM | POA: Diagnosis not present

## 2012-01-14 DIAGNOSIS — E119 Type 2 diabetes mellitus without complications: Secondary | ICD-10-CM | POA: Diagnosis not present

## 2012-01-14 DIAGNOSIS — I10 Essential (primary) hypertension: Secondary | ICD-10-CM | POA: Diagnosis not present

## 2012-01-14 DIAGNOSIS — E782 Mixed hyperlipidemia: Secondary | ICD-10-CM | POA: Diagnosis not present

## 2012-01-14 DIAGNOSIS — N419 Inflammatory disease of prostate, unspecified: Secondary | ICD-10-CM | POA: Diagnosis not present

## 2012-01-14 DIAGNOSIS — E039 Hypothyroidism, unspecified: Secondary | ICD-10-CM | POA: Diagnosis not present

## 2012-01-14 DIAGNOSIS — E559 Vitamin D deficiency, unspecified: Secondary | ICD-10-CM | POA: Diagnosis not present

## 2012-01-14 DIAGNOSIS — E291 Testicular hypofunction: Secondary | ICD-10-CM | POA: Diagnosis not present

## 2012-02-09 DIAGNOSIS — D509 Iron deficiency anemia, unspecified: Secondary | ICD-10-CM | POA: Diagnosis not present

## 2012-02-09 DIAGNOSIS — I10 Essential (primary) hypertension: Secondary | ICD-10-CM | POA: Diagnosis not present

## 2012-02-09 DIAGNOSIS — N2581 Secondary hyperparathyroidism of renal origin: Secondary | ICD-10-CM | POA: Diagnosis not present

## 2012-07-25 DIAGNOSIS — E119 Type 2 diabetes mellitus without complications: Secondary | ICD-10-CM | POA: Diagnosis not present

## 2012-07-25 DIAGNOSIS — N390 Urinary tract infection, site not specified: Secondary | ICD-10-CM | POA: Diagnosis not present

## 2012-07-25 DIAGNOSIS — E559 Vitamin D deficiency, unspecified: Secondary | ICD-10-CM | POA: Diagnosis not present

## 2012-07-25 DIAGNOSIS — R109 Unspecified abdominal pain: Secondary | ICD-10-CM | POA: Diagnosis not present

## 2012-08-18 DIAGNOSIS — E559 Vitamin D deficiency, unspecified: Secondary | ICD-10-CM | POA: Diagnosis not present

## 2012-08-18 DIAGNOSIS — E291 Testicular hypofunction: Secondary | ICD-10-CM | POA: Diagnosis not present

## 2012-08-18 DIAGNOSIS — E039 Hypothyroidism, unspecified: Secondary | ICD-10-CM | POA: Diagnosis not present

## 2012-08-18 DIAGNOSIS — E782 Mixed hyperlipidemia: Secondary | ICD-10-CM | POA: Diagnosis not present

## 2012-08-18 DIAGNOSIS — E119 Type 2 diabetes mellitus without complications: Secondary | ICD-10-CM | POA: Diagnosis not present

## 2012-08-18 DIAGNOSIS — I10 Essential (primary) hypertension: Secondary | ICD-10-CM | POA: Diagnosis not present

## 2012-08-25 DIAGNOSIS — E1129 Type 2 diabetes mellitus with other diabetic kidney complication: Secondary | ICD-10-CM | POA: Diagnosis not present

## 2012-08-25 DIAGNOSIS — D509 Iron deficiency anemia, unspecified: Secondary | ICD-10-CM | POA: Diagnosis not present

## 2012-08-25 DIAGNOSIS — I10 Essential (primary) hypertension: Secondary | ICD-10-CM | POA: Diagnosis not present

## 2012-11-07 DIAGNOSIS — E291 Testicular hypofunction: Secondary | ICD-10-CM | POA: Diagnosis not present

## 2012-11-07 DIAGNOSIS — E782 Mixed hyperlipidemia: Secondary | ICD-10-CM | POA: Diagnosis not present

## 2012-11-07 DIAGNOSIS — E559 Vitamin D deficiency, unspecified: Secondary | ICD-10-CM | POA: Diagnosis not present

## 2012-11-07 DIAGNOSIS — E039 Hypothyroidism, unspecified: Secondary | ICD-10-CM | POA: Diagnosis not present

## 2012-11-07 DIAGNOSIS — E119 Type 2 diabetes mellitus without complications: Secondary | ICD-10-CM | POA: Diagnosis not present

## 2012-11-10 DIAGNOSIS — E291 Testicular hypofunction: Secondary | ICD-10-CM | POA: Diagnosis not present

## 2012-11-10 DIAGNOSIS — E559 Vitamin D deficiency, unspecified: Secondary | ICD-10-CM | POA: Diagnosis not present

## 2012-11-10 DIAGNOSIS — I10 Essential (primary) hypertension: Secondary | ICD-10-CM | POA: Diagnosis not present

## 2012-11-10 DIAGNOSIS — E119 Type 2 diabetes mellitus without complications: Secondary | ICD-10-CM | POA: Diagnosis not present

## 2012-11-10 DIAGNOSIS — E782 Mixed hyperlipidemia: Secondary | ICD-10-CM | POA: Diagnosis not present

## 2012-11-10 DIAGNOSIS — E039 Hypothyroidism, unspecified: Secondary | ICD-10-CM | POA: Diagnosis not present

## 2013-02-15 DIAGNOSIS — E782 Mixed hyperlipidemia: Secondary | ICD-10-CM | POA: Diagnosis not present

## 2013-02-15 DIAGNOSIS — E291 Testicular hypofunction: Secondary | ICD-10-CM | POA: Diagnosis not present

## 2013-02-15 DIAGNOSIS — I10 Essential (primary) hypertension: Secondary | ICD-10-CM | POA: Diagnosis not present

## 2013-02-15 DIAGNOSIS — E039 Hypothyroidism, unspecified: Secondary | ICD-10-CM | POA: Diagnosis not present

## 2013-02-15 DIAGNOSIS — E119 Type 2 diabetes mellitus without complications: Secondary | ICD-10-CM | POA: Diagnosis not present

## 2013-02-15 DIAGNOSIS — E559 Vitamin D deficiency, unspecified: Secondary | ICD-10-CM | POA: Diagnosis not present

## 2013-02-16 DIAGNOSIS — E782 Mixed hyperlipidemia: Secondary | ICD-10-CM | POA: Diagnosis not present

## 2013-02-16 DIAGNOSIS — I10 Essential (primary) hypertension: Secondary | ICD-10-CM | POA: Diagnosis not present

## 2013-02-16 DIAGNOSIS — E119 Type 2 diabetes mellitus without complications: Secondary | ICD-10-CM | POA: Diagnosis not present

## 2013-02-16 DIAGNOSIS — E039 Hypothyroidism, unspecified: Secondary | ICD-10-CM | POA: Diagnosis not present

## 2013-02-16 DIAGNOSIS — E559 Vitamin D deficiency, unspecified: Secondary | ICD-10-CM | POA: Diagnosis not present

## 2013-02-16 DIAGNOSIS — E291 Testicular hypofunction: Secondary | ICD-10-CM | POA: Diagnosis not present

## 2013-03-14 DIAGNOSIS — N419 Inflammatory disease of prostate, unspecified: Secondary | ICD-10-CM | POA: Diagnosis not present

## 2013-04-20 DIAGNOSIS — N058 Unspecified nephritic syndrome with other morphologic changes: Secondary | ICD-10-CM | POA: Diagnosis not present

## 2013-04-20 DIAGNOSIS — N411 Chronic prostatitis: Secondary | ICD-10-CM | POA: Diagnosis not present

## 2013-04-20 DIAGNOSIS — E1129 Type 2 diabetes mellitus with other diabetic kidney complication: Secondary | ICD-10-CM | POA: Diagnosis not present

## 2013-05-25 ENCOUNTER — Other Ambulatory Visit (HOSPITAL_COMMUNITY): Payer: Self-pay | Admitting: Endocrinology

## 2013-05-25 DIAGNOSIS — R109 Unspecified abdominal pain: Secondary | ICD-10-CM

## 2013-05-31 ENCOUNTER — Ambulatory Visit (HOSPITAL_COMMUNITY)
Admission: RE | Admit: 2013-05-31 | Discharge: 2013-05-31 | Disposition: A | Discharge status: Home / Self Care | Payer: 59 | Source: Ambulatory Visit | Attending: Endocrinology | Admitting: Endocrinology

## 2013-05-31 DIAGNOSIS — E119 Type 2 diabetes mellitus without complications: Secondary | ICD-10-CM | POA: Insufficient documentation

## 2013-05-31 DIAGNOSIS — I10 Essential (primary) hypertension: Secondary | ICD-10-CM | POA: Insufficient documentation

## 2013-05-31 DIAGNOSIS — K7689 Other specified diseases of liver: Secondary | ICD-10-CM | POA: Insufficient documentation

## 2013-05-31 DIAGNOSIS — R109 Unspecified abdominal pain: Secondary | ICD-10-CM | POA: Insufficient documentation

## 2013-08-15 DIAGNOSIS — E119 Type 2 diabetes mellitus without complications: Secondary | ICD-10-CM | POA: Diagnosis not present

## 2013-08-15 DIAGNOSIS — N342 Other urethritis: Secondary | ICD-10-CM | POA: Diagnosis not present

## 2013-09-05 ENCOUNTER — Encounter: Payer: Self-pay | Admitting: Internal Medicine

## 2013-09-21 ENCOUNTER — Emergency Department (HOSPITAL_COMMUNITY): Payer: PRIVATE HEALTH INSURANCE

## 2013-09-21 ENCOUNTER — Encounter (HOSPITAL_COMMUNITY): Payer: Self-pay | Admitting: Emergency Medicine

## 2013-09-21 ENCOUNTER — Emergency Department (HOSPITAL_COMMUNITY)
Admission: EM | Admit: 2013-09-21 | Discharge: 2013-09-21 | Disposition: A | Discharge status: Home / Self Care | Payer: PRIVATE HEALTH INSURANCE | Attending: Emergency Medicine | Admitting: Emergency Medicine

## 2013-09-21 DIAGNOSIS — S76119A Strain of unspecified quadriceps muscle, fascia and tendon, initial encounter: Secondary | ICD-10-CM | POA: Insufficient documentation

## 2013-09-21 DIAGNOSIS — M109 Gout, unspecified: Secondary | ICD-10-CM | POA: Diagnosis not present

## 2013-09-21 DIAGNOSIS — R011 Cardiac murmur, unspecified: Secondary | ICD-10-CM | POA: Insufficient documentation

## 2013-09-21 DIAGNOSIS — W010XXA Fall on same level from slipping, tripping and stumbling without subsequent striking against object, initial encounter: Secondary | ICD-10-CM | POA: Insufficient documentation

## 2013-09-21 DIAGNOSIS — M25569 Pain in unspecified knee: Secondary | ICD-10-CM | POA: Diagnosis not present

## 2013-09-21 DIAGNOSIS — Z7982 Long term (current) use of aspirin: Secondary | ICD-10-CM | POA: Insufficient documentation

## 2013-09-21 DIAGNOSIS — E039 Hypothyroidism, unspecified: Secondary | ICD-10-CM | POA: Diagnosis not present

## 2013-09-21 DIAGNOSIS — E785 Hyperlipidemia, unspecified: Secondary | ICD-10-CM | POA: Insufficient documentation

## 2013-09-21 DIAGNOSIS — E119 Type 2 diabetes mellitus without complications: Secondary | ICD-10-CM | POA: Diagnosis not present

## 2013-09-21 DIAGNOSIS — I10 Essential (primary) hypertension: Secondary | ICD-10-CM | POA: Insufficient documentation

## 2013-09-21 DIAGNOSIS — S99929A Unspecified injury of unspecified foot, initial encounter: Principal | ICD-10-CM

## 2013-09-21 DIAGNOSIS — Y9301 Activity, walking, marching and hiking: Secondary | ICD-10-CM | POA: Insufficient documentation

## 2013-09-21 DIAGNOSIS — Z79899 Other long term (current) drug therapy: Secondary | ICD-10-CM | POA: Diagnosis not present

## 2013-09-21 DIAGNOSIS — M25562 Pain in left knee: Secondary | ICD-10-CM

## 2013-09-21 DIAGNOSIS — Z87891 Personal history of nicotine dependence: Secondary | ICD-10-CM | POA: Diagnosis not present

## 2013-09-21 DIAGNOSIS — Z9889 Other specified postprocedural states: Secondary | ICD-10-CM | POA: Insufficient documentation

## 2013-09-21 DIAGNOSIS — S8990XA Unspecified injury of unspecified lower leg, initial encounter: Secondary | ICD-10-CM | POA: Insufficient documentation

## 2013-09-21 DIAGNOSIS — S99919A Unspecified injury of unspecified ankle, initial encounter: Secondary | ICD-10-CM | POA: Diagnosis not present

## 2013-09-21 DIAGNOSIS — K219 Gastro-esophageal reflux disease without esophagitis: Secondary | ICD-10-CM | POA: Diagnosis not present

## 2013-09-21 DIAGNOSIS — Z87448 Personal history of other diseases of urinary system: Secondary | ICD-10-CM | POA: Insufficient documentation

## 2013-09-21 DIAGNOSIS — Y9289 Other specified places as the place of occurrence of the external cause: Secondary | ICD-10-CM | POA: Insufficient documentation

## 2013-09-21 HISTORY — DX: Strain of unspecified quadriceps muscle, fascia and tendon, initial encounter: S76.119A

## 2013-09-21 MED ORDER — HYDROCODONE-ACETAMINOPHEN 5-325 MG PO TABS
1.0000 | ORAL_TABLET | ORAL | Status: DC | PRN
Start: 2013-09-21 — End: 2013-09-26

## 2013-09-21 MED ORDER — HYDROCODONE-ACETAMINOPHEN 5-325 MG PO TABS
1.0000 | ORAL_TABLET | Freq: Once | ORAL | Status: AC
  Administered 2013-09-21: 1 via ORAL
  Filled 2013-09-21: qty 1

## 2013-09-21 NOTE — ED Provider Notes (Signed)
CSN: [id number]     Arrival date & time 09/21/13  1900 History   None    Chief Complaint  Patient presents with  . Knee Injury    HPI   [name] is a 68 y.o. male with a PMH of HTN, hyperlipidemia, DM, gout, GERD, seizure, renal disorder, and thyroid disease who presents to the ED for evaluation of knee pain. History was provided by the patient. Patient states that PTA he was walking down the stairs when he tripped and fell down the last 3 steps landing on his left knee. He denies any other injuries including head injury or LOC. Patient unable to bear weight on his left knee. Complains of a constant pain to left knee worse with movement. No weakness, loss of sensation, numbness/tingling. Nothing provided for pain PTA.       Past Medical History  Diagnosis Date  . Hyperlipemia   . Hypertension   . Diabetes mellitus   . Gout   . GERD (gastroesophageal reflux disease)   . Seizures   . Renal disorder     mild renal insufficiency  . Thyroid disease     hypothyroidism   Past Surgical History  Procedure Laterality Date  . Knee surgery    . Facial fracture surgery     No family history on file. History  Substance Use Topics  . Smoking status: Former Smoker    Quit date: 09/21/1972  . Smokeless tobacco: Not on file  . Alcohol Use: 8.4 oz/week    7 Shots of liquor, 7 Glasses of wine per week    Review of Systems  Cardiovascular: Negative for leg swelling.  Gastrointestinal: Negative for nausea and vomiting.  Musculoskeletal: Positive for arthralgias (left knee) and joint swelling. Negative for back pain, gait problem (due to pain) and myalgias.  Skin: Negative for wound.  Neurological: Negative for dizziness, weakness, numbness and headaches.    Allergies  Nsaids and Ranitidine hcl  Home Medications   Prior to Admission medications   Medication Sig Start Date End Date Taking? Authorizing Provider  acetaminophen (TYLENOL) 500 MG tablet Take 1,000 mg by mouth  daily. For pain.    Historical Provider, MD  allopurinol (ZYLOPRIM) 300 MG tablet Take 300 mg by mouth daily.    Historical Provider, MD  aspirin EC 81 MG tablet Take 81 mg by mouth daily.    Historical Provider, MD  atorvastatin (LIPITOR) 80 MG tablet Take 80 mg by mouth daily.    Historical Provider, MD  Choline Fenofibrate (TRILIPIX) 135 MG capsule Take 135 mg by mouth daily.    Historical Provider, MD  esomeprazole (NEXIUM) 40 MG capsule Take 40 mg by mouth daily before breakfast.    Historical Provider, MD  exenatide (BYETTA) 5 MCG/0.02ML SOLN Inject 5 mcg into the skin daily.    Historical Provider, MD  ezetimibe (ZETIA) 10 MG tablet Take 10 mg by mouth daily.    Historical Provider, MD  HYDROcodone-acetaminophen (NORCO) 5-325 MG per tablet Take 1 or 2 po every 6 hrs for pain 06/02/11   Janice Norrie, MD  levothyroxine (SYNTHROID, LEVOTHROID) 137 MCG tablet Take 137 mcg by mouth daily.    Historical Provider, MD  sitaGLIPtan-metformin (JANUMET) 50-500 MG per tablet Take 1 tablet by mouth daily.    Historical Provider, MD  telmisartan-hydrochlorothiazide (MICARDIS HCT) 40-12.5 MG per tablet Take 1 tablet by mouth daily.    Historical Provider, MD  Testosterone (ANDROGEL PUMP) 20.25 MG/ACT (1.62%) GEL Place 8 application  onto the skin daily.    Historical Provider, MD  Vitamin D, Ergocalciferol, (DRISDOL) 50000 UNITS CAPS Take 50,000 Units by mouth every 7 (seven) days. Taken on Sunday.    Historical Provider, MD   BP 151/86  Pulse 64  Temp(Src) 97.9 F (36.6 C) (Oral)  Resp 16  Ht 5\' 5"  (1.651 m)  Wt 170 lb (77.111 kg)  BMI 28.29 kg/m2  SpO2 96%  Filed Vitals:   09/21/13 1908 09/21/13 1930 09/21/13 2000 09/21/13 2139  BP: 151/86 148/94 145/74 136/63  Pulse: 64 64 65 66  Temp: 97.9 F (36.6 C)     TempSrc: Oral     Resp: 16   18  Height:      Weight:      SpO2: 96% 97% 94% 94%    Physical Exam  Nursing note and vitals reviewed. Constitutional: He is oriented to person, place,  and time. He appears well-developed and well-nourished. No distress.  HENT:  Head: Normocephalic and atraumatic.  Right Ear: External ear normal.  Left Ear: External ear normal.  Nose: Nose normal.  Mouth/Throat: Oropharynx is clear and moist.  Eyes: Conjunctivae are normal. Right eye exhibits no discharge. Left eye exhibits no discharge.  Cardiovascular: Normal rate and regular rhythm.  Exam reveals no gallop and no friction rub.   Murmur heard. Grade 1/6 holosystolic murmur. Dorsalis pedis pulses present and equal bilaterally.   Pulmonary/Chest: Effort normal and breath sounds normal. No respiratory distress. He has no wheezes. He has no rales. He exhibits no tenderness.  Abdominal: Soft. He exhibits no distension. There is no tenderness.  Musculoskeletal: Normal range of motion. He exhibits edema and tenderness.  Mild edema to the left knee. Unable to assess ROM due to pain. No erythema or wounds. No left hip, calf, ankle or foot tenderness. No LE edema or calf tenderness bilaterally  Neurological: He is alert and oriented to person, place, and time.  Sensation intact in the LE bilaterally  Skin: Skin is warm and dry. He is not diaphoretic.       ED Course  Procedures (including critical care time) Labs Review Labs Reviewed - No data to display  Imaging Review Dg Knee Complete 4 Views Left  09/21/2013   CLINICAL DATA:  Fall down steps. Left knee injury. Left anterior knee pain and swelling.  EXAM: LEFT KNEE - COMPLETE 4+ VIEW  COMPARISON:  None.  FINDINGS: There is no evidence of fracture, dislocation, or joint effusion. There is no evidence of arthropathy or other focal bone abnormality. Enthesopathy seen involving the patella. Mild prepatellar soft tissue swelling noted.  IMPRESSION: Mild prepatellar soft tissue swelling. No evidence of acute fracture or knee joint effusion.   Electronically Signed   By: Earle Gell M.D.   On: 09/21/2013 20:58     EKG Interpretation None       MDM   [name] is a 68 y.o. male with a PMH of HTN, hyperlipidemia, DM, gout, GERD, seizure, renal disorder, and thyroid disease who presents to the ED for evaluation of knee pain. Knee pain possibly due to contusion vs sprain. X-rays negative for fracture or malalignment. Patient neurovascularly intact. Given crutches and orthopedic follow-up. RICE method discussed. Return precautions, discharge instructions, and follow-up was discussed with the patient before discharge.      Discharge Medication List as of 09/21/2013  9:33 PM    START taking these medications   Details  HYDROcodone-acetaminophen (NORCO/VICODIN) 5-325 MG per tablet Take 1-2 tablets by  mouth every 4 (four) hours as needed., Starting 09/21/2013, Until Discontinued, Print         Final impressions: 1. Knee pain, left       Mercy Moore PA-C   This patient was discussed with Dr. Angelina Ok, PA-C 09/22/13 0140

## 2013-09-21 NOTE — ED Notes (Signed)
Patient [name] was coming down the stairs, slipped on tread of stair and fell down the last three steps. Patient has obvious deformity to left knee. Patient was unable to stand. Patient alert and oriented x 4

## 2013-09-21 NOTE — Discharge Instructions (Signed)
-   Return to the emergency department if you develop any changing/worsening condition or any other concerns (please read additional information regarding your condition below) - RICE method (see below) - Vicodin for severe pain (Please be careful with this medication.  It can cause drowsiness.  Use caution while driving, operating machinery, drinking alcohol, or any other activities that may impair your physical or mental abilities)    Knee Pain Knee pain can be a result of an injury or other medical conditions. Treatment will depend on the cause of your pain. HOME CARE  Only take medicine as told by your doctor.  Keep a healthy weight. Being overweight can make the knee hurt more.  Stretch before exercising or playing sports.  If there is constant knee pain, change the way you exercise. Ask your doctor for advice.  Make sure shoes fit well. Choose the right shoe for the sport or activity.  Protect your knees. Wear kneepads if needed.  Rest when you are tired. GET HELP RIGHT AWAY IF:   Your knee pain does not stop.  Your knee pain does not get better.  Your knee joint feels hot to the touch.  You have a fever. MAKE SURE YOU:   Understand these instructions.  Will watch this condition.  Will get help right away if you are not doing well or get worse. Document Released: 07/30/2008 Document Revised: 07/26/2011 Document Reviewed: 07/30/2008 [facility] Patient Information 2014 [place], [place].  RICE: Routine Care for Injuries The routine care of many injuries includes Rest, Ice, Compression, and Elevation (RICE). HOME CARE INSTRUCTIONS  Rest is needed to allow your body to heal. Routine activities can usually be resumed when comfortable. Injured tendons and bones can take up to 6 weeks to heal. Tendons are the cord-like structures that attach muscle to bone.  Ice following an injury helps keep the swelling down and reduces pain.  Put ice in a plastic bag.  Place a towel  between your skin and the bag.  Leave the ice on for 15-20 minutes, 03-04 times a day. Do this while awake, for the first 24 to 48 hours. After that, continue as directed by your caregiver.  Compression helps keep swelling down. It also gives support and helps with discomfort. If an elastic bandage has been applied, it should be removed and reapplied every 3 to 4 hours. It should not be applied tightly, but firmly enough to keep swelling down. Watch fingers or toes for swelling, bluish discoloration, coldness, numbness, or excessive pain. If any of these problems occur, remove the bandage and reapply loosely. Contact your caregiver if these problems continue.  Elevation helps reduce swelling and decreases pain. With extremities, such as the arms, hands, legs, and feet, the injured area should be placed near or above the level of the heart, if possible. SEEK IMMEDIATE MEDICAL CARE IF:  You have persistent pain and swelling.  You develop redness, numbness, or unexpected weakness.  Your symptoms are getting worse rather than improving after several days. These symptoms may indicate that further evaluation or further X-rays are needed. Sometimes, X-rays may not show a small broken bone (fracture) until 1 week or 10 days later. Make a follow-up appointment with your caregiver. Ask when your X-ray results will be ready. Make sure you get your X-ray results. Document Released: 08/15/2000 Document Revised: 07/26/2011 Document Reviewed: 10/02/2010 [facility] Patient Information 2014 [place], [place].

## 2013-09-21 NOTE — ED Provider Notes (Signed)
Patient tripped on steps and fell landing directly on his flexed left knee. He complains of left knee pain. Nonradiating. Worse with movement.. On exam patient alert Glasgow Coma Score 15 he held mildly flexed. Skin intact. Soft tissue swelling overlying patella. No obvious ligamentous laxity. DP pulse 2plus Knee x-ray viewed by me  Orlie Dakin, MD 09/21/13 2116

## 2013-09-22 NOTE — ED Provider Notes (Signed)
Medical screening examination/treatment/procedure(s) were conducted as a shared visit with non-physician practitioner(s) and myself.  I personally evaluated the patient during the encounter.   EKG Interpretation None       Orlie Dakin, MD 09/22/13 1455

## 2013-09-24 ENCOUNTER — Other Ambulatory Visit (HOSPITAL_COMMUNITY): Payer: Self-pay | Admitting: Orthopedic Surgery

## 2013-09-24 ENCOUNTER — Encounter (HOSPITAL_BASED_OUTPATIENT_CLINIC_OR_DEPARTMENT_OTHER): Payer: Self-pay | Admitting: *Deleted

## 2013-09-24 DIAGNOSIS — M25562 Pain in left knee: Secondary | ICD-10-CM

## 2013-09-24 NOTE — Progress Notes (Signed)
Will come in for [id number] pediatrics subspecialties

## 2013-09-24 NOTE — Progress Notes (Signed)
09/24/13 1641  OBSTRUCTIVE SLEEP APNEA  Have you ever been diagnosed with sleep apnea through a sleep study? No  Do you snore loudly (loud enough to be heard through closed doors)?  1  Do you often feel tired, fatigued, or sleepy during the daytime? 0  Has anyone observed you stop breathing during your sleep? 0  Do you have, or are you being treated for high blood pressure? 1  BMI more than 35 kg/m2? 0  Age over 69 years old? 1  Neck circumference greater than 40 cm/16 inches? 1  Gender: 1  Obstructive Sleep Apnea Score 5  Score 4 or greater  Results sent to PCP

## 2013-09-25 ENCOUNTER — Encounter (HOSPITAL_BASED_OUTPATIENT_CLINIC_OR_DEPARTMENT_OTHER)
Admission: RE | Admit: 2013-09-25 | Discharge: 2013-09-25 | Disposition: A | Discharge status: Home / Self Care | Payer: 59 | Source: Ambulatory Visit | Attending: Orthopedic Surgery | Admitting: Orthopedic Surgery

## 2013-09-25 ENCOUNTER — Other Ambulatory Visit: Payer: Self-pay | Admitting: Physician Assistant

## 2013-09-25 ENCOUNTER — Encounter: Payer: Self-pay | Admitting: Physician Assistant

## 2013-09-25 ENCOUNTER — Other Ambulatory Visit: Payer: Self-pay

## 2013-09-25 DIAGNOSIS — E1122 Type 2 diabetes mellitus with diabetic chronic kidney disease: Secondary | ICD-10-CM | POA: Insufficient documentation

## 2013-09-25 DIAGNOSIS — X500XXA Overexertion from strenuous movement or load, initial encounter: Secondary | ICD-10-CM | POA: Diagnosis not present

## 2013-09-25 DIAGNOSIS — Z01810 Encounter for preprocedural cardiovascular examination: Secondary | ICD-10-CM | POA: Insufficient documentation

## 2013-09-25 DIAGNOSIS — N183 Chronic kidney disease, stage 3 unspecified: Secondary | ICD-10-CM | POA: Insufficient documentation

## 2013-09-25 DIAGNOSIS — K219 Gastro-esophageal reflux disease without esophagitis: Secondary | ICD-10-CM | POA: Diagnosis not present

## 2013-09-25 DIAGNOSIS — Z87891 Personal history of nicotine dependence: Secondary | ICD-10-CM | POA: Diagnosis not present

## 2013-09-25 DIAGNOSIS — E785 Hyperlipidemia, unspecified: Secondary | ICD-10-CM | POA: Diagnosis not present

## 2013-09-25 DIAGNOSIS — E079 Disorder of thyroid, unspecified: Secondary | ICD-10-CM | POA: Insufficient documentation

## 2013-09-25 DIAGNOSIS — E039 Hypothyroidism, unspecified: Secondary | ICD-10-CM | POA: Diagnosis not present

## 2013-09-25 DIAGNOSIS — Z79899 Other long term (current) drug therapy: Secondary | ICD-10-CM | POA: Diagnosis not present

## 2013-09-25 DIAGNOSIS — Y9210 Unspecified residential institution as the place of occurrence of the external cause: Secondary | ICD-10-CM | POA: Diagnosis not present

## 2013-09-25 DIAGNOSIS — Z7982 Long term (current) use of aspirin: Secondary | ICD-10-CM | POA: Diagnosis not present

## 2013-09-25 DIAGNOSIS — Z888 Allergy status to other drugs, medicaments and biological substances status: Secondary | ICD-10-CM | POA: Diagnosis not present

## 2013-09-25 DIAGNOSIS — S76119A Strain of unspecified quadriceps muscle, fascia and tendon, initial encounter: Secondary | ICD-10-CM

## 2013-09-25 DIAGNOSIS — I10 Essential (primary) hypertension: Secondary | ICD-10-CM

## 2013-09-25 DIAGNOSIS — G4730 Sleep apnea, unspecified: Secondary | ICD-10-CM | POA: Diagnosis not present

## 2013-09-25 DIAGNOSIS — Z886 Allergy status to analgesic agent status: Secondary | ICD-10-CM | POA: Diagnosis not present

## 2013-09-25 DIAGNOSIS — Y990 Civilian activity done for income or pay: Secondary | ICD-10-CM | POA: Diagnosis not present

## 2013-09-25 DIAGNOSIS — E119 Type 2 diabetes mellitus without complications: Secondary | ICD-10-CM | POA: Diagnosis not present

## 2013-09-25 DIAGNOSIS — S86819A Strain of other muscle(s) and tendon(s) at lower leg level, unspecified leg, initial encounter: Secondary | ICD-10-CM | POA: Diagnosis not present

## 2013-09-25 DIAGNOSIS — S838X9A Sprain of other specified parts of unspecified knee, initial encounter: Secondary | ICD-10-CM | POA: Diagnosis not present

## 2013-09-25 DIAGNOSIS — M109 Gout, unspecified: Secondary | ICD-10-CM | POA: Diagnosis not present

## 2013-09-25 DIAGNOSIS — M2240 Chondromalacia patellae, unspecified knee: Secondary | ICD-10-CM | POA: Diagnosis not present

## 2013-09-25 DIAGNOSIS — S83289A Other tear of lateral meniscus, current injury, unspecified knee, initial encounter: Secondary | ICD-10-CM | POA: Diagnosis not present

## 2013-09-25 DIAGNOSIS — H9190 Unspecified hearing loss, unspecified ear: Secondary | ICD-10-CM

## 2013-09-25 DIAGNOSIS — Z973 Presence of spectacles and contact lenses: Secondary | ICD-10-CM

## 2013-09-25 DIAGNOSIS — IMO0002 Reserved for concepts with insufficient information to code with codable children: Secondary | ICD-10-CM | POA: Diagnosis not present

## 2013-09-25 DIAGNOSIS — W108XXA Fall (on) (from) other stairs and steps, initial encounter: Secondary | ICD-10-CM | POA: Diagnosis not present

## 2013-09-25 LAB — BASIC METABOLIC PANEL
BUN: 34 mg/dL — AB (ref 6–23)
CHLORIDE: 99 meq/L (ref 96–112)
CO2: 26 mEq/L (ref 19–32)
Calcium: 9.8 mg/dL (ref 8.4–10.5)
Creatinine, Ser: 1.87 mg/dL — ABNORMAL HIGH (ref 0.50–1.35)
GFR calc non Af Amer: 35 mL/min — ABNORMAL LOW (ref >90)
GFR, EST AFRICAN AMERICAN: 41 mL/min — AB (ref >90)
Glucose, Bld: 97 mg/dL (ref 70–99)
Potassium: 4.2 mEq/L (ref 3.7–5.3)
Sodium: 139 mEq/L (ref 137–147)

## 2013-09-25 NOTE — H&P (Signed)
[name] is an 68 y.o. male.   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  He slipped on something on a step at [facility] and fell and hyperflexed his knee.  He was seen in the ER and then secondarily at [facility] where x-rays and examination were consistent with a quadriceps tendon rupture.  He has been in a Bledsoe brace locked in full extension and is now seen for follow-up.     Past Medical History  Diagnosis Date  . Hyperlipemia   . Hypertension   . Diabetes mellitus   . Gout   . GERD (gastroesophageal reflux disease)   . Renal disorder     mild renal insufficiency  . Thyroid disease     hypothyroidism  . Wears glasses   . HOH (hard of hearing)   . Seizures 2000    only one time-never dx why-no meds  . Rupture quadriceps tendon 09-21-13    left    Past Surgical History  Procedure Laterality Date  . Knee surgery      left  . Facial fracture surgery  1965  . Tonsillectomy    . Colonoscopy      Family History  Problem Relation Age of Onset  . Hypertension    . Diabetes     Social History:  reports that he quit smoking about 41 years ago. He does not have any smokeless tobacco history on file. He reports that he drinks about 8.4 ounces of alcohol per week. He reports that he does not use illicit drugs.  Allergies:  Allergies  Allergen Reactions  . Nsaids   . Ranitidine Hcl Itching    IV Zantac   Current Outpatient Prescriptions on File Prior to Visit  Medication Sig Dispense Refill  . acetaminophen (TYLENOL) 500 MG tablet Take 1,000 mg by mouth daily. For pain.      Marland Kitchen allopurinol (ZYLOPRIM) 300 MG tablet Take 300 mg by mouth daily.      Marland Kitchen aspirin EC 81 MG tablet Take 81 mg by mouth daily.      Marland Kitchen atorvastatin (LIPITOR) 80 MG tablet Take 80 mg by mouth daily.      . Choline Fenofibrate (TRILIPIX) 135 MG capsule Take 135  mg by mouth daily.      Marland Kitchen esomeprazole (NEXIUM) 40 MG capsule Take 40 mg by mouth daily before breakfast.      . ezetimibe (ZETIA) 10 MG tablet Take 10 mg by mouth daily.      Marland Kitchen HYDROcodone-acetaminophen (NORCO/VICODIN) 5-325 MG per tablet Take 1-2 tablets by mouth every 4 (four) hours as needed.  10 tablet  0  . levothyroxine (SYNTHROID, LEVOTHROID) 137 MCG tablet Take 137 mcg by mouth daily.      . Liraglutide (VICTOZA [place]) Inject 1.2 mg into the skin daily. Plus 4 clicks      . sitaGLIPtan-metformin (JANUMET) 50-500 MG per tablet Take 2 tablets by mouth daily.       Marland Kitchen telmisartan-hydrochlorothiazide (MICARDIS HCT) 40-12.5 MG per tablet Take 1 tablet by mouth daily.      . Vitamin D, Ergocalciferol, (DRISDOL) 50000 UNITS CAPS Take 50,000 Units by mouth every 7 (seven) days. Taken on Sunday.       No current facility-administered medications on file prior to visit.     (Not in a hospital admission)  No results  found for this or any previous visit (from the past 48 hour(s)). No results found.  Review of Systems  Constitutional: Negative.   HENT: Negative.   Eyes: Negative.   Respiratory: Negative.   Cardiovascular: Negative.   Gastrointestinal: Negative.   Genitourinary: Positive for dysuria.  Musculoskeletal: Positive for joint pain.       Left knee pain  Skin: Negative.   Neurological: Negative.   Endo/Heme/Allergies: Negative.   Psychiatric/Behavioral: Negative.     Blood pressure 117/67, pulse 65, height 5\' 5"  (1.651 m), weight 77.111 kg (170 lb). Physical Exam  Constitutional: He is oriented to person, place, and time. He appears well-developed and well-nourished.  HENT:  Head: Normocephalic.  Eyes: Pupils are equal, round, and reactive to light.  Neck: Neck supple.  Cardiovascular: Normal rate.   Respiratory: Effort normal.  GI: Soft.  Genitourinary:  Not pertinent to current symptomatology therefore not examined.  Musculoskeletal:   Examination of his left knee  reveals obvious defect in the distal quadriceps tendon with pain and no active extension.  He does have full passive extension.  Flexion to 90 with pain.  The knee has a stable ligamentous exam.  Right knee has full ROM with minimal pain and swelling.  Neurological: He is alert and oriented to person, place, and time.  Skin: Skin is warm and dry.  Psychiatric: He has a normal mood and affect.     Assessment Patient Active Problem List   Diagnosis Date Noted  . Wears glasses   . HOH (hard of hearing)   . Thyroid disease   . GERD (gastroesophageal reflux disease)   . Diabetes mellitus   . Hypertension   . Hyperlipemia   . Rupture quadriceps tendon 09/21/2013   Plan I have talked to him and his wife about this in detail.  Would recommend with these findings and with his significant pain and disability that we proceed with left knee arthroscopy with attention to his meniscal and chondral pathology followed by open quadriceps tendon rupture repair.  Risks, complications and benefits of the surgery have been described to him in detail and he understands this completely.  Will plan on setting him up for this at some point in the near future.  He will be out of work at this time for the next 2-3 weeks.  We gave him a prescription for a 3-in-1 commode seat.  Britanie Harshman J Bentli Llorente 09/25/2013, 12:33 PM

## 2013-09-26 ENCOUNTER — Encounter (HOSPITAL_BASED_OUTPATIENT_CLINIC_OR_DEPARTMENT_OTHER): Payer: Self-pay | Admitting: Anesthesiology

## 2013-09-26 ENCOUNTER — Encounter (HOSPITAL_BASED_OUTPATIENT_CLINIC_OR_DEPARTMENT_OTHER): Payer: PRIVATE HEALTH INSURANCE | Admitting: Anesthesiology

## 2013-09-26 ENCOUNTER — Ambulatory Visit (HOSPITAL_BASED_OUTPATIENT_CLINIC_OR_DEPARTMENT_OTHER)
Admission: RE | Admit: 2013-09-26 | Discharge: 2013-09-26 | Disposition: A | Discharge status: Home / Self Care | Payer: PRIVATE HEALTH INSURANCE | Source: Ambulatory Visit | Attending: Orthopedic Surgery | Admitting: Orthopedic Surgery

## 2013-09-26 ENCOUNTER — Ambulatory Visit (HOSPITAL_BASED_OUTPATIENT_CLINIC_OR_DEPARTMENT_OTHER): Payer: PRIVATE HEALTH INSURANCE | Admitting: Anesthesiology

## 2013-09-26 ENCOUNTER — Encounter (HOSPITAL_BASED_OUTPATIENT_CLINIC_OR_DEPARTMENT_OTHER)
Admission: RE | Disposition: A | Discharge status: Home / Self Care | Payer: Self-pay | Source: Ambulatory Visit | Attending: Orthopedic Surgery

## 2013-09-26 DIAGNOSIS — N183 Chronic kidney disease, stage 3 unspecified: Secondary | ICD-10-CM | POA: Diagnosis present

## 2013-09-26 DIAGNOSIS — W108XXA Fall (on) (from) other stairs and steps, initial encounter: Secondary | ICD-10-CM | POA: Insufficient documentation

## 2013-09-26 DIAGNOSIS — Y9210 Unspecified residential institution as the place of occurrence of the external cause: Secondary | ICD-10-CM | POA: Insufficient documentation

## 2013-09-26 DIAGNOSIS — M109 Gout, unspecified: Secondary | ICD-10-CM | POA: Insufficient documentation

## 2013-09-26 DIAGNOSIS — G4730 Sleep apnea, unspecified: Secondary | ICD-10-CM | POA: Insufficient documentation

## 2013-09-26 DIAGNOSIS — Z886 Allergy status to analgesic agent status: Secondary | ICD-10-CM | POA: Insufficient documentation

## 2013-09-26 DIAGNOSIS — S838X9A Sprain of other specified parts of unspecified knee, initial encounter: Secondary | ICD-10-CM | POA: Diagnosis not present

## 2013-09-26 DIAGNOSIS — E039 Hypothyroidism, unspecified: Secondary | ICD-10-CM | POA: Insufficient documentation

## 2013-09-26 DIAGNOSIS — E785 Hyperlipidemia, unspecified: Secondary | ICD-10-CM | POA: Diagnosis present

## 2013-09-26 DIAGNOSIS — Z888 Allergy status to other drugs, medicaments and biological substances status: Secondary | ICD-10-CM | POA: Insufficient documentation

## 2013-09-26 DIAGNOSIS — Z7982 Long term (current) use of aspirin: Secondary | ICD-10-CM | POA: Insufficient documentation

## 2013-09-26 DIAGNOSIS — I10 Essential (primary) hypertension: Secondary | ICD-10-CM | POA: Diagnosis present

## 2013-09-26 DIAGNOSIS — IMO0002 Reserved for concepts with insufficient information to code with codable children: Secondary | ICD-10-CM | POA: Insufficient documentation

## 2013-09-26 DIAGNOSIS — S83289A Other tear of lateral meniscus, current injury, unspecified knee, initial encounter: Secondary | ICD-10-CM | POA: Insufficient documentation

## 2013-09-26 DIAGNOSIS — E1122 Type 2 diabetes mellitus with diabetic chronic kidney disease: Secondary | ICD-10-CM | POA: Diagnosis present

## 2013-09-26 DIAGNOSIS — E119 Type 2 diabetes mellitus without complications: Secondary | ICD-10-CM | POA: Insufficient documentation

## 2013-09-26 DIAGNOSIS — S86819A Strain of other muscle(s) and tendon(s) at lower leg level, unspecified leg, initial encounter: Principal | ICD-10-CM

## 2013-09-26 DIAGNOSIS — M2240 Chondromalacia patellae, unspecified knee: Secondary | ICD-10-CM | POA: Insufficient documentation

## 2013-09-26 DIAGNOSIS — S76119A Strain of unspecified quadriceps muscle, fascia and tendon, initial encounter: Secondary | ICD-10-CM | POA: Diagnosis present

## 2013-09-26 DIAGNOSIS — Z87891 Personal history of nicotine dependence: Secondary | ICD-10-CM | POA: Insufficient documentation

## 2013-09-26 DIAGNOSIS — Y990 Civilian activity done for income or pay: Secondary | ICD-10-CM | POA: Insufficient documentation

## 2013-09-26 DIAGNOSIS — Z79899 Other long term (current) drug therapy: Secondary | ICD-10-CM | POA: Insufficient documentation

## 2013-09-26 DIAGNOSIS — X500XXA Overexertion from strenuous movement or load, initial encounter: Secondary | ICD-10-CM | POA: Insufficient documentation

## 2013-09-26 DIAGNOSIS — E079 Disorder of thyroid, unspecified: Secondary | ICD-10-CM | POA: Diagnosis present

## 2013-09-26 DIAGNOSIS — K219 Gastro-esophageal reflux disease without esophagitis: Secondary | ICD-10-CM | POA: Diagnosis present

## 2013-09-26 HISTORY — DX: Unspecified hearing loss, unspecified ear: H91.90

## 2013-09-26 HISTORY — DX: Presence of spectacles and contact lenses: Z97.3

## 2013-09-26 HISTORY — PX: KNEE ARTHROSCOPY: SHX127

## 2013-09-26 HISTORY — PX: QUADRICEPS TENDON REPAIR: SHX756

## 2013-09-26 LAB — GLUCOSE, CAPILLARY
GLUCOSE-CAPILLARY: 105 mg/dL — AB (ref 70–99)
Glucose-Capillary: 126 mg/dL — ABNORMAL HIGH (ref 70–99)

## 2013-09-26 LAB — POCT HEMOGLOBIN-HEMACUE: Hemoglobin: 11.3 g/dL — ABNORMAL LOW (ref 13.0–17.0)

## 2013-09-26 SURGERY — ARTHROSCOPY, KNEE
Anesthesia: General | Site: Knee | Laterality: Left

## 2013-09-26 MED ORDER — OXYCODONE HCL 5 MG PO TABS: 5.0000 mg | ORAL_TABLET | Freq: Once | ORAL | Status: DC | PRN

## 2013-09-26 MED ORDER — CEFAZOLIN SODIUM-DEXTROSE 2-3 GM-% IV SOLR
INTRAVENOUS | Status: AC
  Filled 2013-09-26: qty 50

## 2013-09-26 MED ORDER — LACTATED RINGERS IV SOLN
INTRAVENOUS | Status: DC
  Administered 2013-09-26 (×2): via INTRAVENOUS

## 2013-09-26 MED ORDER — DEXAMETHASONE SODIUM PHOSPHATE 10 MG/ML IJ SOLN
INTRAMUSCULAR | Status: DC | PRN
  Administered 2013-09-26: 5 mg via INTRAVENOUS

## 2013-09-26 MED ORDER — DIAZEPAM 2 MG PO TABS: 2.0000 mg | ORAL_TABLET | Freq: Four times a day (QID) | ORAL | Status: DC | PRN

## 2013-09-26 MED ORDER — LIDOCAINE HCL (CARDIAC) 20 MG/ML IV SOLN
INTRAVENOUS | Status: DC | PRN
  Administered 2013-09-26: 80 mg via INTRAVENOUS

## 2013-09-26 MED ORDER — ASPIRIN EC 325 MG PO TBEC: DELAYED_RELEASE_TABLET | ORAL | Status: DC

## 2013-09-26 MED ORDER — MIDAZOLAM HCL 2 MG/2ML IJ SOLN
INTRAMUSCULAR | Status: AC
  Filled 2013-09-26: qty 2

## 2013-09-26 MED ORDER — SODIUM CHLORIDE 0.9 % IR SOLN
Status: DC | PRN
  Administered 2013-09-26: 3000 mL

## 2013-09-26 MED ORDER — FENTANYL CITRATE 0.05 MG/ML IJ SOLN
INTRAMUSCULAR | Status: AC
  Filled 2013-09-26: qty 2

## 2013-09-26 MED ORDER — FENTANYL CITRATE 0.05 MG/ML IJ SOLN
50.0000 ug | INTRAMUSCULAR | Status: DC | PRN
  Administered 2013-09-26: 100 ug via INTRAVENOUS

## 2013-09-26 MED ORDER — FENTANYL CITRATE 0.05 MG/ML IJ SOLN
INTRAMUSCULAR | Status: DC | PRN
  Administered 2013-09-26 (×2): 25 ug via INTRAVENOUS

## 2013-09-26 MED ORDER — CEFAZOLIN SODIUM-DEXTROSE 2-3 GM-% IV SOLR
2.0000 g | INTRAVENOUS | Status: AC
  Administered 2013-09-26: 2 g via INTRAVENOUS

## 2013-09-26 MED ORDER — FENTANYL CITRATE 0.05 MG/ML IJ SOLN
INTRAMUSCULAR | Status: AC
  Filled 2013-09-26: qty 6

## 2013-09-26 MED ORDER — ONDANSETRON HCL 4 MG/2ML IJ SOLN
INTRAMUSCULAR | Status: DC | PRN
  Administered 2013-09-26: 4 mg via INTRAVENOUS

## 2013-09-26 MED ORDER — CHLORHEXIDINE GLUCONATE 4 % EX LIQD
60.0000 mL | Freq: Once | CUTANEOUS | Status: AC
  Administered 2013-09-26: 4 via TOPICAL

## 2013-09-26 MED ORDER — PROPOFOL 10 MG/ML IV BOLUS
INTRAVENOUS | Status: DC | PRN
  Administered 2013-09-26: 150 mg via INTRAVENOUS

## 2013-09-26 MED ORDER — ONDANSETRON HCL 4 MG/2ML IJ SOLN: 4.0000 mg | Freq: Once | INTRAMUSCULAR | Status: DC | PRN

## 2013-09-26 MED ORDER — MIDAZOLAM HCL 2 MG/2ML IJ SOLN
1.0000 mg | INTRAMUSCULAR | Status: DC | PRN
  Administered 2013-09-26: 2 mg via INTRAVENOUS

## 2013-09-26 MED ORDER — PROPOFOL 10 MG/ML IV BOLUS
INTRAVENOUS | Status: AC
  Filled 2013-09-26: qty 40

## 2013-09-26 MED ORDER — OXYCODONE HCL 5 MG PO TABS: ORAL_TABLET | ORAL | Status: DC

## 2013-09-26 MED ORDER — EPHEDRINE SULFATE 50 MG/ML IJ SOLN
INTRAMUSCULAR | Status: DC | PRN
  Administered 2013-09-26 (×3): 10 mg via INTRAVENOUS

## 2013-09-26 MED ORDER — OXYCODONE HCL 5 MG/5ML PO SOLN: 5.0000 mg | Freq: Once | ORAL | Status: DC | PRN

## 2013-09-26 MED ORDER — HYDROMORPHONE HCL PF 1 MG/ML IJ SOLN: 0.2500 mg | INTRAMUSCULAR | Status: DC | PRN

## 2013-09-26 SURGICAL SUPPLY — 98 items
APL SKNCLS STERI-STRIP NONHPOA (GAUZE/BANDAGES/DRESSINGS)
BAG DECANTER FOR FLEXI CONT (MISCELLANEOUS) IMPLANT
BANDAGE ELASTIC 6 VELCRO ST LF (GAUZE/BANDAGES/DRESSINGS) ×3 IMPLANT
BANDAGE ESMARK 6X9 LF (GAUZE/BANDAGES/DRESSINGS) IMPLANT
BENZOIN TINCTURE PRP APPL 2/3 (GAUZE/BANDAGES/DRESSINGS) IMPLANT
BLADE 10 SAFETY STRL DISP (BLADE) ×3 IMPLANT
BLADE 15 SAFETY STRL DISP (BLADE) ×3 IMPLANT
BLADE CUTTER GATOR 3.5 (BLADE) ×2 IMPLANT
BLADE GREAT WHITE 4.2 (BLADE) IMPLANT
BLADE GREAT WHITE 4.2MM (BLADE)
BNDG CMPR 9X6 STRL LF SNTH (GAUZE/BANDAGES/DRESSINGS) ×1
BNDG COHESIVE 4X5 TAN STRL (GAUZE/BANDAGES/DRESSINGS) IMPLANT
BNDG ESMARK 6X9 LF (GAUZE/BANDAGES/DRESSINGS) ×3
CANISTER SUCT 3000ML (MISCELLANEOUS) IMPLANT
CLEANER CAUTERY TIP 5X5 PAD (MISCELLANEOUS) ×1 IMPLANT
CLOSURE WOUND 1/2 X4 (GAUZE/BANDAGES/DRESSINGS) ×1
CUFF TOURNIQUET SINGLE 34IN LL (TOURNIQUET CUFF) ×2 IMPLANT
DECANTER SPIKE VIAL GLASS SM (MISCELLANEOUS) IMPLANT
DRAPE ARTHROSCOPY W/POUCH 90 (DRAPES) ×3 IMPLANT
DRAPE EXTREMITY T 121X128X90 (DRAPE) ×1 IMPLANT
DRAPE SURG 17X23 STRL (DRAPES) IMPLANT
DRAPE U-SHAPE 47X51 STRL (DRAPES) ×3 IMPLANT
DRSG PAD ABDOMINAL 8X10 ST (GAUZE/BANDAGES/DRESSINGS) ×3 IMPLANT
DURAPREP 26ML APPLICATOR (WOUND CARE) ×3 IMPLANT
ELECT REM PT RETURN 9FT ADLT (ELECTROSURGICAL) ×3
ELECTRODE REM PT RTRN 9FT ADLT (ELECTROSURGICAL) ×1 IMPLANT
GAUZE SPONGE 4X4 12PLY STRL (GAUZE/BANDAGES/DRESSINGS) ×3 IMPLANT
GAUZE SPONGE 4X4 16PLY XRAY LF (GAUZE/BANDAGES/DRESSINGS) IMPLANT
GAUZE XEROFORM 1X8 LF (GAUZE/BANDAGES/DRESSINGS) ×5 IMPLANT
GLOVE BIO SURGEON STRL SZ 6.5 (GLOVE) ×1 IMPLANT
GLOVE BIO SURGEON STRL SZ7 (GLOVE) ×3 IMPLANT
GLOVE BIO SURGEONS STRL SZ 6.5 (GLOVE) ×1
GLOVE BIOGEL PI IND STRL 7.0 (GLOVE) ×1 IMPLANT
GLOVE BIOGEL PI IND STRL 7.5 (GLOVE) ×1 IMPLANT
GLOVE BIOGEL PI IND STRL 8 (GLOVE) ×1 IMPLANT
GLOVE BIOGEL PI INDICATOR 7.0 (GLOVE) ×2
GLOVE BIOGEL PI INDICATOR 7.5 (GLOVE) ×2
GLOVE BIOGEL PI INDICATOR 8 (GLOVE) ×2
GLOVE SS BIOGEL STRL SZ 7.5 (GLOVE) ×1 IMPLANT
GLOVE SUPERSENSE BIOGEL SZ 7.5 (GLOVE) ×2
GOWN STRL REUS W/ TWL LRG LVL3 (GOWN DISPOSABLE) ×1 IMPLANT
GOWN STRL REUS W/TWL LRG LVL3 (GOWN DISPOSABLE) ×5 IMPLANT
HOLDER KNEE FOAM BLUE (MISCELLANEOUS) ×1 IMPLANT
IMMOBILIZER KNEE 24 THIGH 36 (MISCELLANEOUS) ×1 IMPLANT
IMMOBILIZER KNEE 24 UNIV (MISCELLANEOUS) ×6
K-WIRE .062X4 (WIRE) IMPLANT
KNEE WRAP E Z 3 GEL PACK (MISCELLANEOUS) ×3 IMPLANT
MANIFOLD NEPTUNE II (INSTRUMENTS) ×2 IMPLANT
NDL 1/2 CIR CATGUT .05X1.09 (NEEDLE) IMPLANT
NDL MAYO TROCAR (NEEDLE) IMPLANT
NDL SAFETY ECLIPSE 18X1.5 (NEEDLE) ×2 IMPLANT
NEEDLE 1/2 CIR CATGUT .05X1.09 (NEEDLE) ×3 IMPLANT
NEEDLE HYPO 18GX1.5 SHARP (NEEDLE) ×6
NEEDLE HYPO 22GX1.5 SAFETY (NEEDLE) IMPLANT
NEEDLE MAYO TROCAR (NEEDLE) IMPLANT
NS IRRIG 1000ML POUR BTL (IV SOLUTION) ×3 IMPLANT
PACK ARTHROSCOPY DSU (CUSTOM PROCEDURE TRAY) ×3 IMPLANT
PACK BASIN DAY SURGERY FS (CUSTOM PROCEDURE TRAY) ×3 IMPLANT
PAD ALCOHOL SWAB (MISCELLANEOUS) IMPLANT
PAD CLEANER CAUTERY TIP 5X5 (MISCELLANEOUS) ×2
PADDING CAST COTTON 6X4 STRL (CAST SUPPLIES) ×3 IMPLANT
PASSER SUT SWANSON 36MM LOOP (INSTRUMENTS) ×2 IMPLANT
PENCIL BUTTON HOLSTER BLD 10FT (ELECTRODE) ×3 IMPLANT
SET ARTHROSCOPY TUBING (MISCELLANEOUS) ×3
SET ARTHROSCOPY TUBING LN (MISCELLANEOUS) ×1 IMPLANT
SLEEVE SCD COMPRESS KNEE MED (MISCELLANEOUS) ×2 IMPLANT
SPONGE LAP 4X18 X RAY DECT (DISPOSABLE) ×5 IMPLANT
STAPLER VISISTAT 35W (STAPLE) IMPLANT
STRIP CLOSURE SKIN 1/2X4 (GAUZE/BANDAGES/DRESSINGS) ×1 IMPLANT
SUCTION FRAZIER TIP 10 FR DISP (SUCTIONS) IMPLANT
SUT ETHILON 4 0 PS 2 18 (SUTURE) ×3 IMPLANT
SUT FIBERWIRE #2 38 T-5 BLUE (SUTURE) ×6
SUT MNCRL AB 3-0 PS2 18 (SUTURE) ×2 IMPLANT
SUT PDS AB 0 CT 36 (SUTURE) IMPLANT
SUT PROLENE 3 0 PS 2 (SUTURE) ×2 IMPLANT
SUT VIC AB 0 CT1 18XCR BRD 8 (SUTURE) IMPLANT
SUT VIC AB 0 CT1 27 (SUTURE)
SUT VIC AB 0 CT1 27XBRD ANBCTR (SUTURE) IMPLANT
SUT VIC AB 0 CT1 8-18 (SUTURE)
SUT VIC AB 0 SH 27 (SUTURE) ×6 IMPLANT
SUT VIC AB 1 CT1 27 (SUTURE)
SUT VIC AB 1 CT1 27XBRD ANBCTR (SUTURE) IMPLANT
SUT VIC AB 2-0 CT1 27 (SUTURE) ×3
SUT VIC AB 2-0 CT1 TAPERPNT 27 (SUTURE) IMPLANT
SUT VIC AB 2-0 SH 27 (SUTURE) ×6
SUT VIC AB 2-0 SH 27XBRD (SUTURE) ×2 IMPLANT
SUT VIC AB 3-0 PS1 18 (SUTURE)
SUT VIC AB 3-0 PS1 18XBRD (SUTURE) IMPLANT
SUTURE FIBERWR #2 38 T-5 BLUE (SUTURE) IMPLANT
SYR 20CC LL (SYRINGE) IMPLANT
SYR 5ML LL (SYRINGE) ×1 IMPLANT
SYR BULB 3OZ (MISCELLANEOUS) ×3 IMPLANT
TAPE HYPAFIX 4 X10 (GAUZE/BANDAGES/DRESSINGS) ×3 IMPLANT
TOWEL OR 17X24 6PK STRL BLUE (TOWEL DISPOSABLE) ×3 IMPLANT
UNDERPAD 30X30 INCONTINENT (UNDERPADS AND DIAPERS) ×3 IMPLANT
WAND STAR VAC 90 (SURGICAL WAND) IMPLANT
WATER STERILE IRR 1000ML POUR (IV SOLUTION) ×3 IMPLANT
YANKAUER SUCT BULB TIP NO VENT (SUCTIONS) ×3 IMPLANT

## 2013-09-26 NOTE — Progress Notes (Signed)
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 Side rails up, monitors on throughout procedure. See vital signs in flow sheet. Tolerated Procedure well. 

## 2013-09-26 NOTE — Interval H&P Note (Signed)
History and Physical Interval Note:  09/26/2013 10:29 AM  [name]  has presented today for surgery, with the diagnosis of Left: Chondromalacia - Patella, Rupture Tendon - Quadriceps Tendon  The various methods of treatment have been discussed with the patient and family. After consideration of risks, benefits and other options for treatment, the patient has consented to  Procedure(s): LEFT KNEE ARTHROSCOPY WITH DEBRIDEMENT/SHAVING (CHONDROPLASTY), SUTURE REPAIR QUADRICEPS/HAMSTRING MUSCLE RUPTURE PRIMARY (Left) REPAIR QUADRICEP TENDON (Left) as a surgical intervention .  The patient's history has been reviewed, patient examined, no change in status, stable for surgery.  I have reviewed the patient's chart and labs.  Questions were answered to the patient's satisfaction.    I AM PERFORMING THIS UPDATE ON BEHALF OF DR. Noemi Chapel DUE TO TECHNICAL ISSUES WITH EPIC.   Johnny Bridge

## 2013-09-26 NOTE — Transfer of Care (Signed)
Immediate Anesthesia Transfer of Care Note  Patient: [name]  Procedure(s) Performed: Procedure(s): LEFT KNEE ARTHROSCOPY WITH DEBRIDEMENT/SHAVING (CHONDROPLASTY), SUTURE REPAIR QUADRICEPS/HAMSTRING MUSCLE RUPTURE PRIMARY (Left) REPAIR QUADRICEP TENDON (Left)  Patient Location: PACU  Anesthesia Type:GA combined with regional for post-op pain  Level of Consciousness: sedated  Airway & Oxygen Therapy: Patient Spontanous Breathing and Patient connected to face mask oxygen  Post-op Assessment: Report given to PACU RN and Post -op Vital signs reviewed and stable  Post vital signs: Reviewed and stable  Complications: No apparent anesthesia complications

## 2013-09-26 NOTE — Discharge Instructions (Signed)
Regional Anesthesia Blocks ° °1. Numbness or the inability to move the "blocked" extremity may last from 3-48 hours after placement. The length of time depends on the medication injected and your individual response to the medication. If the numbness is not going away after 48 hours, call your surgeon. ° °2. The extremity that is blocked will need to be protected until the numbness is gone and the  Strength has returned. Because you cannot feel it, you will need to take extra care to avoid injury. Because it may be weak, you may have difficulty moving it or using it. You may not know what position it is in without looking at it while the block is in effect. ° °3. For blocks in the legs and feet, returning to weight bearing and walking needs to be done carefully. You will need to wait until the numbness is entirely gone and the strength has returned. You should be able to move your leg and foot normally before you try and bear weight or walk. You will need someone to be with you when you first try to ensure you do not fall and possibly risk injury. ° °4. Bruising and tenderness at the needle site are common side effects and will resolve in a few days. ° °5. Persistent numbness or new problems with movement should be communicated to the surgeon or the [facility] ([phone])/ [facility] ([phone]). ° ° ° °Post Anesthesia Home Care Instructions ° °Activity: °Get plenty of rest for the remainder of the day. A responsible adult should stay with you for 24 hours following the procedure.  °For the next 24 hours, DO NOT: °-Drive a car °-Operate machinery °-Drink alcoholic beverages °-Take any medication unless instructed by your physician °-Make any legal decisions or sign important papers. ° °Meals: °Start with liquid foods such as gelatin or soup. Progress to regular foods as tolerated. Avoid greasy, spicy, heavy foods. If nausea and/or vomiting occur, drink only clear liquids until the  nausea and/or vomiting subsides. Call your physician if vomiting continues. ° °Special Instructions/Symptoms: °Your throat may feel dry or sore from the anesthesia or the breathing tube placed in your throat during surgery. If this causes discomfort, gargle with warm salt water. The discomfort should disappear within 24 hours. ° °

## 2013-09-26 NOTE — H&P (View-Only) (Signed)
[name] is an 68 y.o. male.   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  He slipped on something on a step at [facility] and fell and hyperflexed his knee.  He was seen in the ER and then secondarily at [facility] where x-rays and examination were consistent with a quadriceps tendon rupture.  He has been in a Bledsoe brace locked in full extension and is now seen for follow-up.     Past Medical History  Diagnosis Date  . Hyperlipemia   . Hypertension   . Diabetes mellitus   . Gout   . GERD (gastroesophageal reflux disease)   . Renal disorder     mild renal insufficiency  . Thyroid disease     hypothyroidism  . Wears glasses   . HOH (hard of hearing)   . Seizures 2000    only one time-never dx why-no meds  . Rupture quadriceps tendon 09-21-13    left    Past Surgical History  Procedure Laterality Date  . Knee surgery      left  . Facial fracture surgery  1965  . Tonsillectomy    . Colonoscopy      Family History  Problem Relation Age of Onset  . Hypertension    . Diabetes     Social History:  reports that he quit smoking about 41 years ago. He does not have any smokeless tobacco history on file. He reports that he drinks about 8.4 ounces of alcohol per week. He reports that he does not use illicit drugs.  Allergies:  Allergies  Allergen Reactions  . Nsaids   . Ranitidine Hcl Itching    IV Zantac   Current Outpatient Prescriptions on File Prior to Visit  Medication Sig Dispense Refill  . acetaminophen (TYLENOL) 500 MG tablet Take 1,000 mg by mouth daily. For pain.      Marland Kitchen allopurinol (ZYLOPRIM) 300 MG tablet Take 300 mg by mouth daily.      Marland Kitchen aspirin EC 81 MG tablet Take 81 mg by mouth daily.      Marland Kitchen atorvastatin (LIPITOR) 80 MG tablet Take 80 mg by mouth daily.      . Choline Fenofibrate (TRILIPIX) 135 MG capsule Take 135  mg by mouth daily.      Marland Kitchen esomeprazole (NEXIUM) 40 MG capsule Take 40 mg by mouth daily before breakfast.      . ezetimibe (ZETIA) 10 MG tablet Take 10 mg by mouth daily.      Marland Kitchen HYDROcodone-acetaminophen (NORCO/VICODIN) 5-325 MG per tablet Take 1-2 tablets by mouth every 4 (four) hours as needed.  10 tablet  0  . levothyroxine (SYNTHROID, LEVOTHROID) 137 MCG tablet Take 137 mcg by mouth daily.      . Liraglutide (VICTOZA [place]) Inject 1.2 mg into the skin daily. Plus 4 clicks      . sitaGLIPtan-metformin (JANUMET) 50-500 MG per tablet Take 2 tablets by mouth daily.       Marland Kitchen telmisartan-hydrochlorothiazide (MICARDIS HCT) 40-12.5 MG per tablet Take 1 tablet by mouth daily.      . Vitamin D, Ergocalciferol, (DRISDOL) 50000 UNITS CAPS Take 50,000 Units by mouth every 7 (seven) days. Taken on Sunday.       No current facility-administered medications on file prior to visit.     (Not in a hospital admission)  No results  found for this or any previous visit (from the past 48 hour(s)). No results found.  Review of Systems  Constitutional: Negative.   HENT: Negative.   Eyes: Negative.   Respiratory: Negative.   Cardiovascular: Negative.   Gastrointestinal: Negative.   Genitourinary: Positive for dysuria.  Musculoskeletal: Positive for joint pain.       Left knee pain  Skin: Negative.   Neurological: Negative.   Endo/Heme/Allergies: Negative.   Psychiatric/Behavioral: Negative.     Blood pressure 117/67, pulse 65, height 5\' 5"  (1.651 m), weight 77.111 kg (170 lb). Physical Exam  Constitutional: He is oriented to person, place, and time. He appears well-developed and well-nourished.  HENT:  Head: Normocephalic.  Eyes: Pupils are equal, round, and reactive to light.  Neck: Neck supple.  Cardiovascular: Normal rate.   Respiratory: Effort normal.  GI: Soft.  Genitourinary:  Not pertinent to current symptomatology therefore not examined.  Musculoskeletal:   Examination of his left knee  reveals obvious defect in the distal quadriceps tendon with pain and no active extension.  He does have full passive extension.  Flexion to 90 with pain.  The knee has a stable ligamentous exam.  Right knee has full ROM with minimal pain and swelling.  Neurological: He is alert and oriented to person, place, and time.  Skin: Skin is warm and dry.  Psychiatric: He has a normal mood and affect.     Assessment Patient Active Problem List   Diagnosis Date Noted  . Wears glasses   . HOH (hard of hearing)   . Thyroid disease   . GERD (gastroesophageal reflux disease)   . Diabetes mellitus   . Hypertension   . Hyperlipemia   . Rupture quadriceps tendon 09/21/2013   Plan I have talked to him and his wife about this in detail.  Would recommend with these findings and with his significant pain and disability that we proceed with left knee arthroscopy with attention to his meniscal and chondral pathology followed by open quadriceps tendon rupture repair.  Risks, complications and benefits of the surgery have been described to him in detail and he understands this completely.  Will plan on setting him up for this at some point in the near future.  He will be out of work at this time for the next 2-3 weeks.  We gave him a prescription for a 3-in-1 commode seat.  Britlee Skolnik J Patricie Geeslin 09/25/2013, 12:33 PM

## 2013-09-26 NOTE — Anesthesia Postprocedure Evaluation (Signed)
  Anesthesia Post-op Note  Patient: [name]  Procedure(s) Performed: Procedure(s): LEFT KNEE ARTHROSCOPY WITH DEBRIDEMENT/SHAVING (CHONDROPLASTY), SUTURE REPAIR QUADRICEPS/HAMSTRING MUSCLE RUPTURE PRIMARY (Left) REPAIR QUADRICEP TENDON (Left)  Patient Location: PACU  Anesthesia Type:GA combined with regional for post-op pain  Level of Consciousness: awake, alert  and oriented  Airway and Oxygen Therapy: Patient Spontanous Breathing  Post-op Pain: none  Post-op Assessment: Post-op Vital signs reviewed  Post-op Vital Signs: Reviewed  Last Vitals:  Filed Vitals:   09/26/13 1215  BP: 102/52  Pulse: 73  Temp:   Resp: 15    Complications: No apparent anesthesia complications

## 2013-09-26 NOTE — Anesthesia Preprocedure Evaluation (Addendum)
Anesthesia Evaluation  Patient identified by MRN, date of birth, ID band Patient awake    Reviewed: Allergy & Precautions, H&P , NPO status , Patient's Chart, lab work & pertinent test results  Airway Mallampati: I TM Distance: >3 FB Neck ROM: Full    Dental  (+) Teeth Intact, Dental Advisory Given   Pulmonary sleep apnea (Uses oral appliance) , former smoker,  breath sounds clear to auscultation        Cardiovascular hypertension, Pt. on medications Rhythm:Regular Rate:Normal     Neuro/Psych    GI/Hepatic   Endo/Other  diabetes, Well Controlled, Type 2, Oral Hypoglycemic Agents  Renal/GU      Musculoskeletal   Abdominal   Peds  Hematology   Anesthesia Other Findings   Reproductive/Obstetrics                          Anesthesia Physical Anesthesia Plan  ASA: III  Anesthesia Plan: General   Post-op Pain Management:    Induction: Intravenous  Airway Management Planned: LMA  Additional Equipment:   Intra-op Plan:   Post-operative Plan: Extubation in OR  Informed Consent: I have reviewed the patients History and Physical, chart, labs and discussed the procedure including the risks, benefits and alternatives for the proposed anesthesia with the patient or authorized representative who has indicated his/her understanding and acceptance.   Dental advisory given  Plan Discussed with: CRNA, Anesthesiologist and Surgeon  Anesthesia Plan Comments:         Anesthesia Quick Evaluation

## 2013-09-27 ENCOUNTER — Encounter (HOSPITAL_BASED_OUTPATIENT_CLINIC_OR_DEPARTMENT_OTHER): Payer: Self-pay | Admitting: Orthopedic Surgery

## 2013-09-27 NOTE — Op Note (Signed)
[name], [name]             ACCOUNT NO.:  [id number]  MEDICAL RECORD NO.:  [id number]  LOCATION:                                 FACILITY:  PHYSICIAN:  Lantz Hermann A. Noemi Chapel, M.D. DATE OF BIRTH:  [birth year]  DATE OF PROCEDURE:  09/26/2013 DATE OF DISCHARGE:  09/26/2013                              OPERATIVE REPORT   PREOPERATIVE DIAGNOSES: 1. Left knee quadriceps tendon rupture. 2. 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  POSTOPERATIVE DIAGNOSES: 1. Left knee quadriceps tendon rupture. 2. 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  PROCEDURE: 1. Left knee EUA, followed by arthroscopic partial and medial     meniscectomies with chondroplasty. 2. Left knee open quadriceps tendon rupture repair.  SURGEON:  Audree Camel. Noemi Chapel, MD  ASSISTANT:  Matthew Saras, PA-C  ANESTHESIA:  General.  OPERATIVE TIME:  1 hour.  COMPLICATIONS:  None.  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  Exam and MRIs revealed a complete rupture of the quadriceps tendon as well as a question of partial meniscal tearing.  He is now to undergo arthroscopy and quadriceps tendon rupture repair.  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  He was placed on operative table in supine position.  He received antibiotics preoperatively for prophylaxis. After being placed under general anesthesia, his left knee was examined. He had full range of motion.  Knee was stable to ligamentous exam with normal patellar tracking with obvious distal quadriceps tendon rupture. His left leg was prepped using sterile DuraPrep and draped using sterile technique.  Time-out procedure was called and the correct left knee identified.  Left leg  was exsanguinated and the tourniquet elevated 350 mm.  Initially, the arthroscopy was performed through the anterolateral portal, the arthroscope with a pump attached was placed into an anteromedial portal, and an arthroscopic probe was placed.  On initial inspection of the medial compartment, he had grade 1 and 2 chondromalacia and 20% grade 3 changes which was debrided.  Medial meniscus showed tearing of the posterior medial horn of which 10-15% was resected back to a stable rim.  Intercondylar notch inspected.  Anterior and posterior cruciate ligaments were normal.  Lateral compartment inspected.  The articular cartilage was normal.  Lateral meniscus showed partial tearing 25-30% posterolateral corner, which was resected back to a stable rim.  Patellofemoral joint showed grade 1 and 2 changes.  The patella tracked normally even though there was obvious disruption of the quadriceps tendon.  Medial and lateral gutters were otherwise free of pathology.  At this point, the arthroscope was removed and then a 7-cm longitudinal incision was made over the distal quadriceps tendon down to the patella.  The underlying subcutaneous tissues were incised along with the skin incision.  A large hematoma was evacuated from the wound and he was found to have a complete tear of the quadriceps tendon with tear of the medial and lateral retinaculum as well.  The  torn tendon heads were freshened and then 2 separate #2 FiberWire running mattress locking sutures were placed parallel to each other in the quadriceps tendon exiting in from the distal tendon.  Two drill holes were then placed in the patella exiting in the superior proximal aspect of the patella and each of these drill holes were used to pass the sutures through.  After this was done, the knee was brought under extension and then these sutures were tied down over the top of the patella thus completely securing and solidly securing the quadriceps  tendon back to its original patella attachment.  After this was done, then the medial and lateral retinaculum was each repaired with multiple 0 Vicryl interrupted locking sutures.  After this was done, there was found to be a firm and tight repair.  The knee could be brought up to 30 degrees of flexion before any tension was noted on the repair.  The wound was then irrigated and tourniquet was released.  Subcutaneous tissues were closed with 0 and 2-0 Vicryl, subcuticular layer closed with 4-0 Monocryl. Arthroscopic portals closed with 4-0 Prolene.  Sterile dressings were applied and a knee immobilizer, and then the patient awakened and taken to recovery room in stable condition.  Needle and sponge counts were correct x2 at the end of the case.  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  He will be seen back in office in a week for wound check and follow up.     Gerardine Peltz A. Noemi Chapel, M.D.   ______________________________ Audree Camel. Noemi Chapel, M.D.    RAW/MEDQ  D:  09/26/2013  T:  09/27/2013  Job:  [id number]

## 2013-10-17 ENCOUNTER — Encounter (HOSPITAL_BASED_OUTPATIENT_CLINIC_OR_DEPARTMENT_OTHER): Payer: Self-pay | Admitting: Orthopedic Surgery

## 2013-10-30 DIAGNOSIS — J342 Deviated nasal septum: Secondary | ICD-10-CM | POA: Diagnosis not present

## 2013-10-30 DIAGNOSIS — H9190 Unspecified hearing loss, unspecified ear: Secondary | ICD-10-CM | POA: Diagnosis not present

## 2013-10-30 DIAGNOSIS — J309 Allergic rhinitis, unspecified: Secondary | ICD-10-CM | POA: Diagnosis not present

## 2013-10-30 DIAGNOSIS — R498 Other voice and resonance disorders: Secondary | ICD-10-CM | POA: Diagnosis not present

## 2013-11-02 DIAGNOSIS — E039 Hypothyroidism, unspecified: Secondary | ICD-10-CM | POA: Diagnosis not present

## 2013-11-02 DIAGNOSIS — E559 Vitamin D deficiency, unspecified: Secondary | ICD-10-CM | POA: Diagnosis not present

## 2013-11-02 DIAGNOSIS — E119 Type 2 diabetes mellitus without complications: Secondary | ICD-10-CM | POA: Diagnosis not present

## 2013-11-02 DIAGNOSIS — E291 Testicular hypofunction: Secondary | ICD-10-CM | POA: Diagnosis not present

## 2013-11-02 DIAGNOSIS — E782 Mixed hyperlipidemia: Secondary | ICD-10-CM | POA: Diagnosis not present

## 2013-11-02 DIAGNOSIS — I10 Essential (primary) hypertension: Secondary | ICD-10-CM | POA: Diagnosis not present

## 2014-02-08 DIAGNOSIS — E291 Testicular hypofunction: Secondary | ICD-10-CM | POA: Diagnosis not present

## 2014-02-08 DIAGNOSIS — E039 Hypothyroidism, unspecified: Secondary | ICD-10-CM | POA: Diagnosis not present

## 2014-02-08 DIAGNOSIS — E119 Type 2 diabetes mellitus without complications: Secondary | ICD-10-CM | POA: Diagnosis not present

## 2014-02-08 DIAGNOSIS — E782 Mixed hyperlipidemia: Secondary | ICD-10-CM | POA: Diagnosis not present

## 2014-02-08 DIAGNOSIS — E559 Vitamin D deficiency, unspecified: Secondary | ICD-10-CM | POA: Diagnosis not present

## 2014-02-08 DIAGNOSIS — I10 Essential (primary) hypertension: Secondary | ICD-10-CM | POA: Diagnosis not present

## 2014-02-19 DIAGNOSIS — E119 Type 2 diabetes mellitus without complications: Secondary | ICD-10-CM | POA: Diagnosis not present

## 2014-02-19 DIAGNOSIS — R799 Abnormal finding of blood chemistry, unspecified: Secondary | ICD-10-CM | POA: Diagnosis not present

## 2014-02-19 DIAGNOSIS — E559 Vitamin D deficiency, unspecified: Secondary | ICD-10-CM | POA: Diagnosis not present

## 2014-02-19 DIAGNOSIS — E538 Deficiency of other specified B group vitamins: Secondary | ICD-10-CM | POA: Diagnosis not present

## 2014-02-19 DIAGNOSIS — E039 Hypothyroidism, unspecified: Secondary | ICD-10-CM | POA: Diagnosis not present

## 2014-02-19 DIAGNOSIS — I10 Essential (primary) hypertension: Secondary | ICD-10-CM | POA: Diagnosis not present

## 2014-02-19 DIAGNOSIS — E782 Mixed hyperlipidemia: Secondary | ICD-10-CM | POA: Diagnosis not present

## 2014-02-19 DIAGNOSIS — E291 Testicular hypofunction: Secondary | ICD-10-CM | POA: Diagnosis not present

## 2014-05-03 DIAGNOSIS — E063 Autoimmune thyroiditis: Secondary | ICD-10-CM | POA: Diagnosis not present

## 2014-05-03 DIAGNOSIS — N183 Chronic kidney disease, stage 3 (moderate): Secondary | ICD-10-CM | POA: Diagnosis not present

## 2014-05-03 DIAGNOSIS — E038 Other specified hypothyroidism: Secondary | ICD-10-CM | POA: Diagnosis not present

## 2014-05-03 DIAGNOSIS — N411 Chronic prostatitis: Secondary | ICD-10-CM | POA: Diagnosis not present

## 2014-07-19 ENCOUNTER — Other Ambulatory Visit: Payer: Self-pay | Admitting: Ophthalmology

## 2014-07-19 DIAGNOSIS — D3100 Benign neoplasm of unspecified conjunctiva: Secondary | ICD-10-CM | POA: Diagnosis not present

## 2014-08-16 ENCOUNTER — Other Ambulatory Visit: Payer: Self-pay | Admitting: Dermatology

## 2014-08-16 DIAGNOSIS — Z85828 Personal history of other malignant neoplasm of skin: Secondary | ICD-10-CM | POA: Diagnosis not present

## 2014-08-16 DIAGNOSIS — D485 Neoplasm of uncertain behavior of skin: Secondary | ICD-10-CM | POA: Diagnosis not present

## 2014-08-16 DIAGNOSIS — D1801 Hemangioma of skin and subcutaneous tissue: Secondary | ICD-10-CM | POA: Diagnosis not present

## 2014-08-16 DIAGNOSIS — D225 Melanocytic nevi of trunk: Secondary | ICD-10-CM | POA: Diagnosis not present

## 2014-08-16 DIAGNOSIS — L738 Other specified follicular disorders: Secondary | ICD-10-CM | POA: Diagnosis not present

## 2014-08-16 DIAGNOSIS — D2372 Other benign neoplasm of skin of left lower limb, including hip: Secondary | ICD-10-CM | POA: Diagnosis not present

## 2014-08-16 DIAGNOSIS — D2239 Melanocytic nevi of other parts of face: Secondary | ICD-10-CM | POA: Diagnosis not present

## 2014-08-29 ENCOUNTER — Encounter: Payer: Self-pay | Admitting: Internal Medicine

## 2014-09-05 ENCOUNTER — Other Ambulatory Visit: Payer: Self-pay | Admitting: Dermatology

## 2014-09-05 DIAGNOSIS — D485 Neoplasm of uncertain behavior of skin: Secondary | ICD-10-CM | POA: Diagnosis not present

## 2014-09-05 DIAGNOSIS — Z85828 Personal history of other malignant neoplasm of skin: Secondary | ICD-10-CM | POA: Diagnosis not present

## 2014-12-10 ENCOUNTER — Ambulatory Visit: Payer: 59 | Admitting: Physical Therapy

## 2014-12-24 ENCOUNTER — Ambulatory Visit: Payer: 59 | Attending: Endocrinology

## 2014-12-24 DIAGNOSIS — M542 Cervicalgia: Secondary | ICD-10-CM | POA: Diagnosis not present

## 2014-12-24 DIAGNOSIS — M2560 Stiffness of unspecified joint, not elsewhere classified: Secondary | ICD-10-CM | POA: Insufficient documentation

## 2014-12-24 DIAGNOSIS — M545 Low back pain, unspecified: Secondary | ICD-10-CM

## 2014-12-24 DIAGNOSIS — R293 Abnormal posture: Secondary | ICD-10-CM | POA: Insufficient documentation

## 2014-12-24 DIAGNOSIS — M436 Torticollis: Secondary | ICD-10-CM | POA: Insufficient documentation
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
## 2014-12-24 NOTE — Patient Instructions (Signed)
From cabinet cervical rotation and side bend with neck flexed 3-4 fingers off chest 3-5 reps 3-5x/day 2-5 sec RT and LT

## 2015-05-18 DIAGNOSIS — G4733 Obstructive sleep apnea (adult) (pediatric): Secondary | ICD-10-CM

## 2015-05-18 HISTORY — DX: Obstructive sleep apnea (adult) (pediatric): G47.33

## 2015-06-18 ENCOUNTER — Encounter: Payer: Self-pay | Admitting: Internal Medicine

## 2015-06-19 DIAGNOSIS — E038 Other specified hypothyroidism: Secondary | ICD-10-CM | POA: Diagnosis not present

## 2015-06-19 DIAGNOSIS — E119 Type 2 diabetes mellitus without complications: Secondary | ICD-10-CM | POA: Diagnosis not present

## 2015-06-19 DIAGNOSIS — E559 Vitamin D deficiency, unspecified: Secondary | ICD-10-CM | POA: Diagnosis not present

## 2015-06-19 DIAGNOSIS — E782 Mixed hyperlipidemia: Secondary | ICD-10-CM | POA: Diagnosis not present

## 2015-06-20 DIAGNOSIS — N183 Chronic kidney disease, stage 3 (moderate): Secondary | ICD-10-CM | POA: Diagnosis not present

## 2015-06-20 DIAGNOSIS — E230 Hypopituitarism: Secondary | ICD-10-CM | POA: Diagnosis not present

## 2015-06-20 DIAGNOSIS — E119 Type 2 diabetes mellitus without complications: Secondary | ICD-10-CM | POA: Diagnosis not present

## 2015-06-20 DIAGNOSIS — E559 Vitamin D deficiency, unspecified: Secondary | ICD-10-CM | POA: Diagnosis not present

## 2015-06-20 DIAGNOSIS — E782 Mixed hyperlipidemia: Secondary | ICD-10-CM | POA: Diagnosis not present

## 2015-06-20 DIAGNOSIS — E790 Hyperuricemia without signs of inflammatory arthritis and tophaceous disease: Secondary | ICD-10-CM | POA: Diagnosis not present

## 2015-06-20 DIAGNOSIS — E038 Other specified hypothyroidism: Secondary | ICD-10-CM | POA: Diagnosis not present

## 2015-06-20 DIAGNOSIS — I10 Essential (primary) hypertension: Secondary | ICD-10-CM | POA: Diagnosis not present

## 2015-08-01 DIAGNOSIS — E119 Type 2 diabetes mellitus without complications: Secondary | ICD-10-CM | POA: Diagnosis not present

## 2015-08-01 DIAGNOSIS — H2513 Age-related nuclear cataract, bilateral: Secondary | ICD-10-CM | POA: Diagnosis not present

## 2015-10-31 DIAGNOSIS — D2271 Melanocytic nevi of right lower limb, including hip: Secondary | ICD-10-CM | POA: Diagnosis not present

## 2015-10-31 DIAGNOSIS — L821 Other seborrheic keratosis: Secondary | ICD-10-CM | POA: Diagnosis not present

## 2015-10-31 DIAGNOSIS — Z85828 Personal history of other malignant neoplasm of skin: Secondary | ICD-10-CM | POA: Diagnosis not present

## 2015-10-31 DIAGNOSIS — L570 Actinic keratosis: Secondary | ICD-10-CM | POA: Diagnosis not present

## 2015-10-31 DIAGNOSIS — D2239 Melanocytic nevi of other parts of face: Secondary | ICD-10-CM | POA: Diagnosis not present

## 2015-10-31 DIAGNOSIS — D225 Melanocytic nevi of trunk: Secondary | ICD-10-CM | POA: Diagnosis not present

## 2015-10-31 DIAGNOSIS — L814 Other melanin hyperpigmentation: Secondary | ICD-10-CM | POA: Diagnosis not present

## 2015-10-31 DIAGNOSIS — D1801 Hemangioma of skin and subcutaneous tissue: Secondary | ICD-10-CM | POA: Diagnosis not present

## 2015-11-07 ENCOUNTER — Ambulatory Visit (INDEPENDENT_AMBULATORY_CARE_PROVIDER_SITE_OTHER): Payer: 59 | Admitting: Internal Medicine

## 2015-11-07 ENCOUNTER — Encounter: Payer: Self-pay | Admitting: Internal Medicine

## 2015-11-07 VITALS — BP 116/68 | HR 55 | Ht 65.0 in | Wt 160.8 lb

## 2015-11-07 DIAGNOSIS — G4733 Obstructive sleep apnea (adult) (pediatric): Secondary | ICD-10-CM

## 2015-11-07 DIAGNOSIS — K219 Gastro-esophageal reflux disease without esophagitis: Secondary | ICD-10-CM

## 2015-11-07 HISTORY — DX: Obstructive sleep apnea (adult) (pediatric): G47.33

## 2015-11-07 NOTE — Assessment & Plan Note (Signed)
Being managed by PCP. Reflux is blamed for chronic hoarseness.

## 2015-11-07 NOTE — Patient Instructions (Addendum)
Order- schedule unattended Home Sleep Test    Dx OSA  We will want to be in touch about a week after your sleep test to discuss next steps.

## 2015-11-07 NOTE — Assessment & Plan Note (Signed)
Symptomatic now despite previously adequate oral appliance, with witnessed snoring, fragmented sleep, some daytime sleepiness/witnessed apneas although wife in separate room. Plan-update diagnostic study. We should be able to qualify for unattended Home Sleep Test. We discussed CPAP as an alternative to an oral appliance depending on study result. I will call him when the study is done to discuss next steps

## 2015-11-07 NOTE — Progress Notes (Signed)
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 Original diagnosis with Sleep Study At [place]; Pt's wife noted snoring at that time. PT had option of surgery, oral appliance, or CPAP. Pt is using oral appliance. Pt is having increased nasal congestion from allergies. Pt is waking up often through the night more than usual now.  Seasonal and perennial allergic rhinitis and nasal septal deviation. ENT surgery-tonsillectomy. Had repair of avulsion injury chin and gum caused by accident while in [employer]. Despite using his (original) oral appliance now he is told he still snores. He is waking more often at night. He lost weight at the time of a significant knee injury and 2015 and has kept that weight off. Denies history of heart or lung disease. Medical management for hypertension, DM 2. Unexplained "seizure" December 2000. Chronic hoarseness attributed to GERD with ENT exam/Dr. Janace Hoard and treatment with Nexium. Epworth Score 9/24  Prior to Admission medications   Medication Sig Start Date End Date Taking? Authorizing Provider  acetaminophen (TYLENOL) 500 MG tablet Take 1,000 mg by mouth daily. For pain.   Yes Historical Provider, MD  allopurinol (ZYLOPRIM) 300 MG tablet Take 300 mg by mouth daily.   Yes Historical Provider, MD  aspirin 81 MG tablet Take 81 mg by mouth daily.   Yes Historical Provider, MD  atorvastatin (LIPITOR) 80 MG tablet Take 80 mg by mouth daily.   Yes Historical Provider, MD  Choline Fenofibrate (TRILIPIX) 135 MG capsule Take 135 mg by mouth daily.   Yes Historical Provider, MD  esomeprazole (NEXIUM) 40 MG capsule Take 40 mg by mouth daily before breakfast.   Yes Historical Provider, MD  ezetimibe (ZETIA) 10 MG tablet Take 10 mg by mouth daily.   Yes Historical Provider, MD  levothyroxine (SYNTHROID, LEVOTHROID) 137 MCG tablet Take 137 mcg by  mouth daily.   Yes Historical Provider, MD  Liraglutide (VICTOZA Harper) Inject 1.2 mg into the skin daily. Plus 4 clicks   Yes Historical Provider, MD  sitaGLIPtan-metformin (JANUMET) 50-500 MG per tablet Take 2 tablets by mouth daily.    Yes Historical Provider, MD  telmisartan-hydrochlorothiazide (MICARDIS HCT) 40-12.5 MG per tablet Take 0.5 tablets by mouth daily.    Yes Historical Provider, MD  traZODone (DESYREL) 50 MG tablet Take 50 mg by mouth at bedtime.   Yes Historical Provider, MD  Vitamin D, Ergocalciferol, (DRISDOL) 50000 UNITS CAPS Take 50,000 Units by mouth every 7 (seven) days. Taken on Sunday.   Yes Historical Provider, MD   Past Medical History  Diagnosis Date  . Hyperlipemia   . Hypertension   . Diabetes mellitus ([place])   . Gout   . GERD (gastroesophageal reflux disease)   . Renal disorder     mild renal insufficiency  . Thyroid disease     hypothyroidism  . Wears glasses   . HOH (hard of hearing)   . Seizures ([place]) 2000    only one time-never dx why-no meds  . Rupture quadriceps tendon 09-21-13    left   Past Surgical History  Procedure Laterality Date  . Knee surgery      left  . Facial fracture surgery  1965  . Tonsillectomy    . Colonoscopy    . Knee arthroscopy Left 09/26/2013    Procedure: LEFT KNEE ARTHROSCOPY WITH DEBRIDEMENT/SHAVING (CHONDROPLASTY), SUTURE REPAIR QUADRICEPS/HAMSTRING MUSCLE RUPTURE PRIMARY;  Surgeon: Lorn Junes, MD;  Location: [facility];  Service: Orthopedics;  Laterality: Left;  . Quadriceps tendon repair Left 09/26/2013    Procedure: REPAIR QUADRICEP TENDON;  Surgeon: Lorn Junes, MD;  Location: [facility];  Service: Orthopedics;  Laterality: Left;   Family History  Problem Relation Age of Onset  . Hypertension    . Diabetes     Social History   Social History  . Marital Status: Married    Spouse Name: N/A  . Number of Children: N/A  . Years of Education: N/A   Occupational History  . Not  on file.   Social History Main Topics  . Smoking status: Former Smoker    Quit date: 09/21/1972  . Smokeless tobacco: Not on file  . Alcohol Use: 8.4 oz/week    7 Glasses of wine, 7 Shots of liquor per week  . Drug Use: No  . Sexual Activity: Not on file   Other Topics Concern  . Not on file   Social History Narrative   ROS-see HPI   Negative unless "+" Constitutional:    weight loss, night sweats, fevers, chills, + fatigue, lassitude. HEENT:    headaches, difficulty swallowing, tooth/dental problems, sore throat,       sneezing, itching, ear ache, nasal congestion, post nasal drip, snoring CV:    chest pain, orthopnea, PND, swelling in lower extremities, anasarca,                                                     dizziness, palpitations Resp:   shortness of breath with exertion or at rest.                productive cough,   non-productive cough, coughing up of blood.              change in color of mucus.  wheezing.   Skin:    rash or lesions. GI:  No-   heartburn, indigestion, abdominal pain, nausea, vomiting, diarrhea,                 change in bowel habits, loss of appetite GU: dysuria, change in color of urine, + nocturia.   flank pain. MS:   joint pain, stiffness, decreased range of motion, back pain. Neuro-     nothing unusual Psych:  change in mood or affect.  depression or anxiety.   memory loss.  OBJ- Physical Exam General- Alert, Oriented, Affect-appropriate, Distress- none acute Skin- rash-none, lesions- none, excoriation- none Lymphadenopathy- none Head- atraumatic            Eyes- Gross vision intact, PERRLA, conjunctivae and secretions clear            Ears- Hearing, canals-normal            Nose- Clear, no-Septal dev, mucus, polyps, erosion, perforation             Throat- Mallampati II , mucosa clear , drainage- none, tonsils- absent, + hoarse without stridor Neck- flexible , trachea midline, no stridor , thyroid nl, carotid no bruit Chest - symmetrical  excursion , unlabored           Heart/CV- RRR , no murmur , no gallop  , no rub, nl s1 s2                           -  JVD- none , edema- none, stasis changes- none, varices- none           Lung- clear to P&A, wheeze- none, cough- none , dullness-none, rub- none           Chest wall-  Abd-  Br/ Gen/ Rectal- Not done, not indicated Extrem- cyanosis- none, clubbing, none, atrophy- none, strength- nl Neuro- grossly intact to observation

## 2015-11-21 DIAGNOSIS — E782 Mixed hyperlipidemia: Secondary | ICD-10-CM | POA: Diagnosis not present

## 2015-11-21 DIAGNOSIS — N183 Chronic kidney disease, stage 3 (moderate): Secondary | ICD-10-CM | POA: Diagnosis not present

## 2015-11-21 DIAGNOSIS — I10 Essential (primary) hypertension: Secondary | ICD-10-CM | POA: Diagnosis not present

## 2015-11-21 DIAGNOSIS — E119 Type 2 diabetes mellitus without complications: Secondary | ICD-10-CM | POA: Diagnosis not present

## 2015-11-21 DIAGNOSIS — E790 Hyperuricemia without signs of inflammatory arthritis and tophaceous disease: Secondary | ICD-10-CM | POA: Diagnosis not present

## 2015-11-21 DIAGNOSIS — E230 Hypopituitarism: Secondary | ICD-10-CM | POA: Diagnosis not present

## 2015-11-21 DIAGNOSIS — E559 Vitamin D deficiency, unspecified: Secondary | ICD-10-CM | POA: Diagnosis not present

## 2015-11-21 DIAGNOSIS — E038 Other specified hypothyroidism: Secondary | ICD-10-CM | POA: Diagnosis not present

## 2015-11-23 DIAGNOSIS — G4733 Obstructive sleep apnea (adult) (pediatric): Secondary | ICD-10-CM

## 2015-11-26 DIAGNOSIS — G4733 Obstructive sleep apnea (adult) (pediatric): Secondary | ICD-10-CM | POA: Diagnosis not present

## 2015-11-27 ENCOUNTER — Other Ambulatory Visit: Payer: Self-pay | Admitting: *Deleted

## 2015-11-27 DIAGNOSIS — G4733 Obstructive sleep apnea (adult) (pediatric): Secondary | ICD-10-CM

## 2015-12-02 ENCOUNTER — Telehealth: Payer: Self-pay | Admitting: Internal Medicine

## 2015-12-02 NOTE — Telephone Encounter (Signed)
Left message to return my call for test reslt

## 2015-12-03 ENCOUNTER — Telehealth: Payer: Self-pay | Admitting: Internal Medicine

## 2015-12-03 DIAGNOSIS — G4733 Obstructive sleep apnea (adult) (pediatric): Secondary | ICD-10-CM

## 2015-12-03 NOTE — Telephone Encounter (Signed)
Pt can be seen on 02/11/16 at 11:00am for 30 minute OV. Thanks.

## 2015-12-03 NOTE — Addendum Note (Signed)
Addended by: Mathis Dad on: 12/03/2015 09:35 AM   Modules accepted: Orders

## 2015-12-03 NOTE — Telephone Encounter (Signed)
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 His sleep study showed AHI 13.6/ hr c/w mild OSA. He has previous experience w oral appliance and wishes to try CPAP. Ordering new CPAP auto 5-20, w office f/u

## 2015-12-03 NOTE — Telephone Encounter (Addendum)
LMTCB x1 CY created message. Will forward to [facility] inbox for follow up.

## 2015-12-03 NOTE — Telephone Encounter (Signed)
Order entered for CPAP. Patient aware per Dr. Janee Morn conversation with patient. Pt will need 31-90 day follow up with Dr. Annamaria Boots will send to [facility] for appointment.

## 2015-12-09 NOTE — Telephone Encounter (Signed)
LMTCB-pt will need to be scheduled on 02-11-16 at RNA slot(15 minutes slot is okay). Thanks.

## 2015-12-12 DIAGNOSIS — S42209A Unspecified fracture of upper end of unspecified humerus, initial encounter for closed fracture: Secondary | ICD-10-CM | POA: Diagnosis not present

## 2015-12-12 DIAGNOSIS — G4733 Obstructive sleep apnea (adult) (pediatric): Secondary | ICD-10-CM | POA: Diagnosis not present

## 2015-12-16 NOTE — Telephone Encounter (Signed)
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 Will await call

## 2016-01-12 DIAGNOSIS — G4733 Obstructive sleep apnea (adult) (pediatric): Secondary | ICD-10-CM | POA: Diagnosis not present

## 2016-01-12 DIAGNOSIS — S42209A Unspecified fracture of upper end of unspecified humerus, initial encounter for closed fracture: Secondary | ICD-10-CM | POA: Diagnosis not present

## 2016-01-14 DIAGNOSIS — I10 Essential (primary) hypertension: Secondary | ICD-10-CM | POA: Diagnosis not present

## 2016-01-14 DIAGNOSIS — N183 Chronic kidney disease, stage 3 (moderate): Secondary | ICD-10-CM | POA: Diagnosis not present

## 2016-01-14 DIAGNOSIS — E782 Mixed hyperlipidemia: Secondary | ICD-10-CM | POA: Diagnosis not present

## 2016-01-14 DIAGNOSIS — E119 Type 2 diabetes mellitus without complications: Secondary | ICD-10-CM | POA: Diagnosis not present

## 2016-01-14 DIAGNOSIS — E559 Vitamin D deficiency, unspecified: Secondary | ICD-10-CM | POA: Diagnosis not present

## 2016-01-14 DIAGNOSIS — E790 Hyperuricemia without signs of inflammatory arthritis and tophaceous disease: Secondary | ICD-10-CM | POA: Diagnosis not present

## 2016-01-14 DIAGNOSIS — E230 Hypopituitarism: Secondary | ICD-10-CM | POA: Diagnosis not present

## 2016-01-15 DIAGNOSIS — N411 Chronic prostatitis: Secondary | ICD-10-CM | POA: Diagnosis not present

## 2016-01-15 DIAGNOSIS — E038 Other specified hypothyroidism: Secondary | ICD-10-CM | POA: Diagnosis not present

## 2016-01-15 DIAGNOSIS — Z6827 Body mass index (BMI) 27.0-27.9, adult: Secondary | ICD-10-CM | POA: Diagnosis not present

## 2016-01-15 DIAGNOSIS — E063 Autoimmune thyroiditis: Secondary | ICD-10-CM | POA: Diagnosis not present

## 2016-01-15 DIAGNOSIS — E1129 Type 2 diabetes mellitus with other diabetic kidney complication: Secondary | ICD-10-CM | POA: Diagnosis not present

## 2016-01-15 DIAGNOSIS — N183 Chronic kidney disease, stage 3 (moderate): Secondary | ICD-10-CM | POA: Diagnosis not present

## 2016-02-27 DIAGNOSIS — E782 Mixed hyperlipidemia: Secondary | ICD-10-CM | POA: Diagnosis not present

## 2016-02-27 DIAGNOSIS — N183 Chronic kidney disease, stage 3 (moderate): Secondary | ICD-10-CM | POA: Diagnosis not present

## 2016-02-27 DIAGNOSIS — E119 Type 2 diabetes mellitus without complications: Secondary | ICD-10-CM | POA: Diagnosis not present

## 2016-02-27 DIAGNOSIS — E038 Other specified hypothyroidism: Secondary | ICD-10-CM | POA: Diagnosis not present

## 2016-02-27 DIAGNOSIS — E790 Hyperuricemia without signs of inflammatory arthritis and tophaceous disease: Secondary | ICD-10-CM | POA: Diagnosis not present

## 2016-02-27 DIAGNOSIS — I10 Essential (primary) hypertension: Secondary | ICD-10-CM | POA: Diagnosis not present

## 2016-02-27 DIAGNOSIS — E230 Hypopituitarism: Secondary | ICD-10-CM | POA: Diagnosis not present

## 2016-02-27 DIAGNOSIS — E559 Vitamin D deficiency, unspecified: Secondary | ICD-10-CM | POA: Diagnosis not present

## 2016-03-16 ENCOUNTER — Telehealth: Payer: Self-pay | Admitting: Internal Medicine

## 2016-03-16 NOTE — Telephone Encounter (Signed)
Spoke with pt; he can come in on Friday 03-19-16 at 11:30am. Appointment made and nothing further needed at this time.

## 2016-03-17 DIAGNOSIS — G4733 Obstructive sleep apnea (adult) (pediatric): Secondary | ICD-10-CM | POA: Diagnosis not present

## 2016-03-17 DIAGNOSIS — S42209A Unspecified fracture of upper end of unspecified humerus, initial encounter for closed fracture: Secondary | ICD-10-CM | POA: Diagnosis not present

## 2016-03-19 ENCOUNTER — Ambulatory Visit (INDEPENDENT_AMBULATORY_CARE_PROVIDER_SITE_OTHER): Payer: 59 | Admitting: Internal Medicine

## 2016-03-19 ENCOUNTER — Encounter: Payer: Self-pay | Admitting: Internal Medicine

## 2016-03-19 DIAGNOSIS — J302 Other seasonal allergic rhinitis: Secondary | ICD-10-CM

## 2016-03-19 DIAGNOSIS — G4733 Obstructive sleep apnea (adult) (pediatric): Secondary | ICD-10-CM

## 2016-03-19 DIAGNOSIS — J3089 Other allergic rhinitis: Secondary | ICD-10-CM

## 2016-03-19 MED ORDER — AZELASTINE HCL 0.1 % NA SOLN: NASAL | 12 refills | Status: AC

## 2016-03-19 NOTE — Progress Notes (Signed)
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 Original diagnosis with Sleep Study At [facility]; Pt's wife noted snoring at that time. PT had option of surgery, oral appliance, or CPAP. Pt is using oral appliance. Pt is having increased nasal congestion from allergies. Pt is waking up often through the night more than usual now.  Seasonal and perennial allergic rhinitis and nasal septal deviation. ENT surgery-tonsillectomy. Had repair of avulsion injury chin and gum caused by accident while in [employer]. Despite using his (original) oral appliance now he is told he still snores. He is waking more often at night. He lost weight at the time of a significant knee injury and 2015 and has kept that weight off. Denies history of heart or lung disease. Medical management for hypertension, DM 2. Unexplained "seizure" December 2000. Chronic hoarseness attributed to GERD with ENT exam/Dr. Janace Hoard and treatment with Nexium. 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  denies any mask or pressure issues. He had previously used an oral appliance after initial diagnosis in 1999. He continued to snore with oral appliance and has now been using CPAP. CPAP download confirms excellent compliance 100%/4 hours, and control AHI 1.1/hour. He is comfortable with pressure. We discussed options for mask or pressure adjustment if needed. He has been sleeping much better. Wife is currently in another room and not telling him if he snores through the mask the way he did with his  oral appliance before. Ongoing nasal congestion is bothersome but he seeks to avoid steroid products because of his diabetes.  ROS-see HPI   Negative unless "+" Constitutional:    weight loss, night sweats, fevers, chills,  fatigue, lassitude. HEENT:    headaches, difficulty swallowing, tooth/dental problems, sore throat,       sneezing, itching, ear ache, + nasal congestion, post nasal drip, snoring CV:    chest pain, orthopnea, PND, swelling in lower extremities, anasarca,                                                     dizziness, palpitations Resp:   shortness of breath with exertion or at rest.                productive cough,   non-productive cough, coughing up of blood.              change in color of mucus.  wheezing.   Skin:    rash or lesions. GI:  No-   heartburn, indigestion, abdominal pain, nausea, vomiting, diarrhea,                 change in bowel habits, loss of appetite GU: dysuria, change in color of urine, + nocturia.   flank pain. MS:   joint pain, stiffness, decreased range of motion, back pain. Neuro-     nothing unusual Psych:  change in mood or affect.  depression or anxiety.   memory loss.  OBJ- Physical Exam General- Alert, Oriented, Affect-appropriate, Distress- none acute Skin- rash-none, lesions-  none, excoriation- none Lymphadenopathy- none Head- atraumatic            Eyes- Gross vision intact, PERRLA, conjunctivae and secretions clear            Ears- Hearing, canals-normal            Nose-  no-Septal dev, mucus, polyps, erosion, perforation             Throat- Mallampati II , mucosa clear , drainage- none, tonsils- absent, + hoarse without stridor Neck- flexible , trachea midline, no stridor , thyroid nl, carotid no bruit Chest - symmetrical excursion , unlabored           Heart/CV- RRR , no murmur , no gallop  , no rub, nl s1 s2                           - JVD- none , edema- none, stasis changes- none, varices- none           Lung- clear to P&A,  wheeze- none, cough- none , dullness-none, rub- none           Chest wall-  Abd-  Br/ Gen/ Rectal- Not done, not indicated Extrem- cyanosis- none, clubbing, none, atrophy- none, strength- nl Neuro- grossly intact to observation

## 2016-03-19 NOTE — Patient Instructions (Addendum)
We can continue CPAP auto 5-20, mask of choice, humidifier, supplies, AirView   Dx OSA  For the rhinitis-   Consider trying otc Nasalcrom/ cromolyn/ Cromol nasal spray                             As an alternative, script printed for nasal antihistamine azelastine you can experiment with as needed  Please call if we can help

## 2016-03-19 NOTE — Assessment & Plan Note (Signed)
Download confirms excellent compliance and control. He is comfortable with auto Pap range 5-20. Sleeping better. Plan-we are going to continue current settings. He can discuss mask choice options with his DME company.

## 2016-03-19 NOTE — Assessment & Plan Note (Signed)
Allergic and nonallergic components. We discussed nonsteroidal nasal spray options including OTC Nasalcrom and antihistamine azelastine. He can try these as he chooses.

## 2016-05-17 HISTORY — PX: COLONOSCOPY WITH PROPOFOL: SHX5780

## 2016-06-11 DIAGNOSIS — E1122 Type 2 diabetes mellitus with diabetic chronic kidney disease: Secondary | ICD-10-CM | POA: Diagnosis not present

## 2016-06-11 DIAGNOSIS — I129 Hypertensive chronic kidney disease with stage 1 through stage 4 chronic kidney disease, or unspecified chronic kidney disease: Secondary | ICD-10-CM | POA: Diagnosis not present

## 2016-06-11 DIAGNOSIS — E0580 Other thyrotoxicosis without thyrotoxic crisis or storm: Secondary | ICD-10-CM | POA: Diagnosis not present

## 2016-06-11 DIAGNOSIS — G4733 Obstructive sleep apnea (adult) (pediatric): Secondary | ICD-10-CM | POA: Diagnosis not present

## 2016-06-11 DIAGNOSIS — E790 Hyperuricemia without signs of inflammatory arthritis and tophaceous disease: Secondary | ICD-10-CM | POA: Diagnosis not present

## 2016-06-11 DIAGNOSIS — E038 Other specified hypothyroidism: Secondary | ICD-10-CM | POA: Diagnosis not present

## 2016-06-11 DIAGNOSIS — M150 Primary generalized (osteo)arthritis: Secondary | ICD-10-CM | POA: Diagnosis not present

## 2016-06-11 DIAGNOSIS — N189 Chronic kidney disease, unspecified: Secondary | ICD-10-CM | POA: Diagnosis not present

## 2016-06-11 DIAGNOSIS — E782 Mixed hyperlipidemia: Secondary | ICD-10-CM | POA: Diagnosis not present

## 2016-06-11 DIAGNOSIS — E230 Hypopituitarism: Secondary | ICD-10-CM | POA: Diagnosis not present

## 2016-06-11 DIAGNOSIS — N400 Enlarged prostate without lower urinary tract symptoms: Secondary | ICD-10-CM | POA: Diagnosis not present

## 2016-06-11 DIAGNOSIS — E559 Vitamin D deficiency, unspecified: Secondary | ICD-10-CM | POA: Diagnosis not present

## 2016-07-21 DIAGNOSIS — M722 Plantar fascial fibromatosis: Secondary | ICD-10-CM | POA: Diagnosis not present

## 2016-07-24 DIAGNOSIS — M25571 Pain in right ankle and joints of right foot: Secondary | ICD-10-CM | POA: Diagnosis not present

## 2016-10-29 ENCOUNTER — Telehealth: Payer: Self-pay | Admitting: Internal Medicine

## 2016-10-29 NOTE — Telephone Encounter (Signed)
Pt calling to see if he can have colon done next Thursday or Friday with Dr. Henrene Pastor or another physician. He was supposed to be out of town but had to cancel the trip so he has these days available. Dr. Blanch Media schedule is full but let pt know I would check and let him know. Please advise.

## 2016-11-01 NOTE — Telephone Encounter (Signed)
Per Lanelle Bal RN ok for 12noon slot on 11/04/16 as long as there is a break in the room. Pt scheduled for previsit tomorrow at 1pm. Pt aware of appts and Dr. Henrene Pastor aware.

## 2016-11-01 NOTE — Telephone Encounter (Signed)
Dr. Loren Racer Chart reviewed. Overdue for follow up. Recall letters sent out 2015 and 2016. I could add him on at noon on Thursday, but ONLY if ok with Lanelle Bal ([facility] head nurse). Otherwise, it would have to be next available with me. Thanks

## 2016-11-01 NOTE — Telephone Encounter (Signed)
Message left for [name] to call back.

## 2016-11-02 ENCOUNTER — Ambulatory Visit (AMBULATORY_SURGERY_CENTER): Payer: Self-pay

## 2016-11-02 VITALS — Ht 65.0 in | Wt 168.8 lb

## 2016-11-02 DIAGNOSIS — Z86010 Personal history of colonic polyps: Secondary | ICD-10-CM

## 2016-11-02 MED ORDER — NA SULFATE-K SULFATE-MG SULF 17.5-3.13-1.6 GM/177ML PO SOLN: 1.0000 | Freq: Once | ORAL | 0 refills | Status: AC

## 2016-11-02 NOTE — Progress Notes (Signed)
Denies allergies to eggs or soy products. Denies complication of anesthesia or sedation. Denies use of weight loss medication. Denies use of O2.   Emmi instructions given for colonoscopy.  

## 2016-11-04 ENCOUNTER — Ambulatory Visit (AMBULATORY_SURGERY_CENTER): Payer: 59 | Admitting: Internal Medicine

## 2016-11-04 ENCOUNTER — Encounter: Payer: Self-pay | Admitting: Internal Medicine

## 2016-11-04 VITALS — BP 113/61 | HR 61 | Temp 96.9°F | Resp 15 | Ht 65.0 in | Wt 168.0 lb

## 2016-11-04 DIAGNOSIS — F329 Major depressive disorder, single episode, unspecified: Secondary | ICD-10-CM | POA: Diagnosis not present

## 2016-11-04 DIAGNOSIS — D124 Benign neoplasm of descending colon: Secondary | ICD-10-CM | POA: Diagnosis not present

## 2016-11-04 DIAGNOSIS — Z860100 Personal history of colon polyps, unspecified: Secondary | ICD-10-CM

## 2016-11-04 DIAGNOSIS — D123 Benign neoplasm of transverse colon: Secondary | ICD-10-CM | POA: Diagnosis not present

## 2016-11-04 DIAGNOSIS — E119 Type 2 diabetes mellitus without complications: Secondary | ICD-10-CM | POA: Diagnosis not present

## 2016-11-04 DIAGNOSIS — K219 Gastro-esophageal reflux disease without esophagitis: Secondary | ICD-10-CM | POA: Diagnosis not present

## 2016-11-04 DIAGNOSIS — I10 Essential (primary) hypertension: Secondary | ICD-10-CM | POA: Diagnosis not present

## 2016-11-04 DIAGNOSIS — G4733 Obstructive sleep apnea (adult) (pediatric): Secondary | ICD-10-CM | POA: Diagnosis not present

## 2016-11-04 DIAGNOSIS — E669 Obesity, unspecified: Secondary | ICD-10-CM | POA: Diagnosis not present

## 2016-11-04 MED ORDER — SODIUM CHLORIDE 0.9 % IV SOLN: 500.0000 mL | INTRAVENOUS | Status: DC

## 2016-11-04 NOTE — Patient Instructions (Signed)
YOU HAD AN ENDOSCOPIC PROCEDURE TODAY AT THE [facility]:   Refer to the procedure report that was given to you for any specific questions about what was found during the examination.  If the procedure report does not answer your questions, please call your gastroenterologist to clarify.  If you requested that your care partner not be given the details of your procedure findings, then the procedure report has been included in a sealed envelope for you to review at your convenience later.  YOU SHOULD EXPECT: Some feelings of bloating in the abdomen. Passage of more gas than usual.  Walking can help get rid of the air that was put into your GI tract during the procedure and reduce the bloating. If you had a lower endoscopy (such as a colonoscopy or flexible sigmoidoscopy) you may notice spotting of blood in your stool or on the toilet paper. If you underwent a bowel prep for your procedure, you may not have a normal bowel movement for a few days.  Please Note:  You might notice some irritation and congestion in your nose or some drainage.  This is from the oxygen used during your procedure.  There is no need for concern and it should clear up in a day or so.  SYMPTOMS TO REPORT IMMEDIATELY:   Following lower endoscopy (colonoscopy or flexible sigmoidoscopy):  Excessive amounts of blood in the stool  Significant tenderness or worsening of abdominal pains  Swelling of the abdomen that is new, acute  Fever of 100F or higher    For urgent or emergent issues, a gastroenterologist can be reached at any hour by calling [phone].   DIET:  We do recommend a small meal at first, but then you may proceed to your regular diet.  Drink plenty of fluids but you should avoid alcoholic beverages for 24 hours.  ACTIVITY:  You should plan to take it easy for the rest of today and you should NOT DRIVE or use heavy machinery until tomorrow (because of the sedation medicines used during the test).     FOLLOW UP: Our staff will call the number listed on your records the next business day following your procedure to check on you and address any questions or concerns that you may have regarding the information given to you following your procedure. If we do not reach you, we will leave a message.  However, if you are feeling well and you are not experiencing any problems, there is no need to return our call.  We will assume that you have returned to your regular daily activities without incident.  If any biopsies were taken you will be contacted by phone or by letter within the next 1-3 weeks.  Please call us at [phone] if you have not heard about the biopsies in 3 weeks.    SIGNATURES/CONFIDENTIALITY: You and/or your care partner have signed paperwork which will be entered into your electronic medical record.  These signatures attest to the fact that that the information above on your After Visit Summary has been reviewed and is understood.  Full responsibility of the confidentiality of this discharge information lies with you and/or your care-partner.   Resume medications. Information given on polyps and diverticulosis. 

## 2016-11-04 NOTE — Op Note (Signed)
[name]: [name] Procedure Date: 11/04/2016 12:09 PM MRN: [id number] Endoscopist: Docia Chuck. Henrene Pastor , MD Age: 71 Referring MD:  Date of Birth: 1945/09/01 Gender: Male Account #: [id number] Procedure:                Colonoscopy, with cold snare polypectomy X2 Indications:              High risk colon cancer surveillance: Personal                            history of non-advanced adenoma. Index examination                            June 2010 (2 small tubular adenomas) Medicines:                Monitored Anesthesia Care Procedure:                Pre-Anesthesia Assessment:                           - Prior to the procedure, a History and Physical                            was performed, and patient medications and                            allergies were reviewed. The patient's tolerance of                            previous anesthesia was also reviewed. The risks                            and benefits of the procedure and the sedation                            options and risks were discussed with the patient.                            All questions were answered, and informed consent                            was obtained. Prior Anticoagulants: The patient has                            taken no previous anticoagulant or antiplatelet                            agents. ASA Grade Assessment: II - A patient with                            mild systemic disease. After reviewing the risks                            and benefits, the patient was deemed in  satisfactory condition to undergo the procedure.                           After obtaining informed consent, the colonoscope                            was passed under direct vision. Throughout the                            procedure, the patient's blood pressure, pulse, and                            oxygen saturations were monitored continuously. The       Colonoscope was introduced through the anus and                            advanced to the the cecum, identified by                            appendiceal orifice and ileocecal valve. The                            ileocecal valve, appendiceal orifice, and rectum                            were photographed. The quality of the bowel                            preparation was excellent. The colonoscopy was                            performed without difficulty. The patient tolerated                            the procedure well. The bowel preparation used was                            SUPREP. Scope In: 12:21:03 PM Scope Out: 12:38:33 PM Scope Withdrawal Time: 0 hours 14 minutes 29 seconds  Total Procedure Duration: 0 hours 17 minutes 30 seconds  Findings:                 Two polyps were found in the descending colon and                            transverse colon. The polyps were 2 to 4 mm in                            size. These polyps were removed with a cold snare.                            Resection and retrieval were complete.  Multiple diverticula were found in the left colon.                           The exam was otherwise without abnormality on                            direct and retroflexion views. Complications:            No immediate complications. Estimated blood loss:                            None. Estimated Blood Loss:     Estimated blood loss: none. Impression:               - Two 2 to 4 mm polyps in the descending colon and                            in the transverse colon, removed with a cold snare.                            Resected and retrieved.                           - Diverticulosis in the left colon.                           - The examination was otherwise normal on direct                            and retroflexion views. Recommendation:           - Repeat colonoscopy in 5 years for surveillance.                            - Patient has a contact number available for                            emergencies. The signs and symptoms of potential                            delayed complications were discussed with the                            patient. Return to normal activities tomorrow.                            Written discharge instructions were provided to the                            patient.                           - Resume previous diet.                           - Continue present medications.                           -  Await pathology results. Docia Chuck. Henrene Pastor, MD 11/04/2016 12:44:18 PM This report has been signed electronically.

## 2016-11-04 NOTE — Progress Notes (Signed)
Called to room to assist during endoscopic procedure.  Patient ID and intended procedure confirmed with present staff. Received instructions for my participation in the procedure from the performing physician.  

## 2016-11-04 NOTE — Progress Notes (Signed)
Report to PACU, RN, vss, BBS= Clear.  

## 2016-11-05 ENCOUNTER — Telehealth: Payer: Self-pay | Admitting: *Deleted

## 2016-11-05 NOTE — Telephone Encounter (Signed)

## 2016-11-09 ENCOUNTER — Encounter: Payer: Self-pay | Admitting: Internal Medicine

## 2016-11-12 DIAGNOSIS — E119 Type 2 diabetes mellitus without complications: Secondary | ICD-10-CM | POA: Diagnosis not present

## 2016-11-12 DIAGNOSIS — E039 Hypothyroidism, unspecified: Secondary | ICD-10-CM | POA: Diagnosis not present

## 2016-11-12 DIAGNOSIS — E559 Vitamin D deficiency, unspecified: Secondary | ICD-10-CM | POA: Diagnosis not present

## 2016-11-12 DIAGNOSIS — E063 Autoimmune thyroiditis: Secondary | ICD-10-CM | POA: Diagnosis not present

## 2016-11-12 DIAGNOSIS — E038 Other specified hypothyroidism: Secondary | ICD-10-CM | POA: Diagnosis not present

## 2016-11-12 DIAGNOSIS — E0580 Other thyrotoxicosis without thyrotoxic crisis or storm: Secondary | ICD-10-CM | POA: Diagnosis not present

## 2016-11-12 DIAGNOSIS — E790 Hyperuricemia without signs of inflammatory arthritis and tophaceous disease: Secondary | ICD-10-CM | POA: Diagnosis not present

## 2016-11-12 DIAGNOSIS — I129 Hypertensive chronic kidney disease with stage 1 through stage 4 chronic kidney disease, or unspecified chronic kidney disease: Secondary | ICD-10-CM | POA: Diagnosis not present

## 2016-11-12 DIAGNOSIS — N183 Chronic kidney disease, stage 3 (moderate): Secondary | ICD-10-CM | POA: Diagnosis not present

## 2016-11-12 DIAGNOSIS — N400 Enlarged prostate without lower urinary tract symptoms: Secondary | ICD-10-CM | POA: Diagnosis not present

## 2016-11-12 DIAGNOSIS — E782 Mixed hyperlipidemia: Secondary | ICD-10-CM | POA: Diagnosis not present

## 2016-11-12 DIAGNOSIS — I10 Essential (primary) hypertension: Secondary | ICD-10-CM | POA: Diagnosis not present

## 2016-11-12 DIAGNOSIS — E230 Hypopituitarism: Secondary | ICD-10-CM | POA: Diagnosis not present

## 2016-11-12 DIAGNOSIS — G4733 Obstructive sleep apnea (adult) (pediatric): Secondary | ICD-10-CM | POA: Diagnosis not present

## 2016-12-03 DIAGNOSIS — D1801 Hemangioma of skin and subcutaneous tissue: Secondary | ICD-10-CM | POA: Diagnosis not present

## 2016-12-03 DIAGNOSIS — L738 Other specified follicular disorders: Secondary | ICD-10-CM | POA: Diagnosis not present

## 2016-12-03 DIAGNOSIS — Z85828 Personal history of other malignant neoplasm of skin: Secondary | ICD-10-CM | POA: Diagnosis not present

## 2016-12-03 DIAGNOSIS — D225 Melanocytic nevi of trunk: Secondary | ICD-10-CM | POA: Diagnosis not present

## 2016-12-03 DIAGNOSIS — L814 Other melanin hyperpigmentation: Secondary | ICD-10-CM | POA: Diagnosis not present

## 2016-12-03 DIAGNOSIS — L821 Other seborrheic keratosis: Secondary | ICD-10-CM | POA: Diagnosis not present

## 2017-02-11 DIAGNOSIS — M159 Polyosteoarthritis, unspecified: Secondary | ICD-10-CM | POA: Diagnosis not present

## 2017-02-11 DIAGNOSIS — E782 Mixed hyperlipidemia: Secondary | ICD-10-CM | POA: Diagnosis not present

## 2017-02-11 DIAGNOSIS — E0580 Other thyrotoxicosis without thyrotoxic crisis or storm: Secondary | ICD-10-CM | POA: Diagnosis not present

## 2017-02-11 DIAGNOSIS — E230 Hypopituitarism: Secondary | ICD-10-CM | POA: Diagnosis not present

## 2017-02-11 DIAGNOSIS — N400 Enlarged prostate without lower urinary tract symptoms: Secondary | ICD-10-CM | POA: Diagnosis not present

## 2017-02-11 DIAGNOSIS — E559 Vitamin D deficiency, unspecified: Secondary | ICD-10-CM | POA: Diagnosis not present

## 2017-02-11 DIAGNOSIS — I129 Hypertensive chronic kidney disease with stage 1 through stage 4 chronic kidney disease, or unspecified chronic kidney disease: Secondary | ICD-10-CM | POA: Diagnosis not present

## 2017-02-11 DIAGNOSIS — E790 Hyperuricemia without signs of inflammatory arthritis and tophaceous disease: Secondary | ICD-10-CM | POA: Diagnosis not present

## 2017-02-11 DIAGNOSIS — N183 Chronic kidney disease, stage 3 (moderate): Secondary | ICD-10-CM | POA: Diagnosis not present

## 2017-02-11 DIAGNOSIS — E039 Hypothyroidism, unspecified: Secondary | ICD-10-CM | POA: Diagnosis not present

## 2017-02-11 DIAGNOSIS — E1122 Type 2 diabetes mellitus with diabetic chronic kidney disease: Secondary | ICD-10-CM | POA: Diagnosis not present

## 2017-02-11 DIAGNOSIS — G4733 Obstructive sleep apnea (adult) (pediatric): Secondary | ICD-10-CM | POA: Diagnosis not present

## 2017-02-25 DIAGNOSIS — S42209A Unspecified fracture of upper end of unspecified humerus, initial encounter for closed fracture: Secondary | ICD-10-CM | POA: Diagnosis not present

## 2017-02-25 DIAGNOSIS — G4733 Obstructive sleep apnea (adult) (pediatric): Secondary | ICD-10-CM | POA: Diagnosis not present

## 2017-03-15 DIAGNOSIS — E063 Autoimmune thyroiditis: Secondary | ICD-10-CM | POA: Diagnosis not present

## 2017-03-15 DIAGNOSIS — N2581 Secondary hyperparathyroidism of renal origin: Secondary | ICD-10-CM | POA: Diagnosis not present

## 2017-03-15 DIAGNOSIS — E1129 Type 2 diabetes mellitus with other diabetic kidney complication: Secondary | ICD-10-CM | POA: Diagnosis not present

## 2017-03-15 DIAGNOSIS — E038 Other specified hypothyroidism: Secondary | ICD-10-CM | POA: Diagnosis not present

## 2017-03-15 DIAGNOSIS — N183 Chronic kidney disease, stage 3 (moderate): Secondary | ICD-10-CM | POA: Diagnosis not present

## 2017-03-15 DIAGNOSIS — N411 Chronic prostatitis: Secondary | ICD-10-CM | POA: Diagnosis not present

## 2017-03-15 DIAGNOSIS — Z6827 Body mass index (BMI) 27.0-27.9, adult: Secondary | ICD-10-CM | POA: Diagnosis not present

## 2017-04-15 DIAGNOSIS — N183 Chronic kidney disease, stage 3 (moderate): Secondary | ICD-10-CM | POA: Diagnosis not present

## 2017-05-20 DIAGNOSIS — E790 Hyperuricemia without signs of inflammatory arthritis and tophaceous disease: Secondary | ICD-10-CM | POA: Diagnosis not present

## 2017-05-20 DIAGNOSIS — E0580 Other thyrotoxicosis without thyrotoxic crisis or storm: Secondary | ICD-10-CM | POA: Diagnosis not present

## 2017-05-20 DIAGNOSIS — E230 Hypopituitarism: Secondary | ICD-10-CM | POA: Diagnosis not present

## 2017-05-20 DIAGNOSIS — M159 Polyosteoarthritis, unspecified: Secondary | ICD-10-CM | POA: Diagnosis not present

## 2017-05-20 DIAGNOSIS — N183 Chronic kidney disease, stage 3 (moderate): Secondary | ICD-10-CM | POA: Diagnosis not present

## 2017-05-20 DIAGNOSIS — E039 Hypothyroidism, unspecified: Secondary | ICD-10-CM | POA: Diagnosis not present

## 2017-05-20 DIAGNOSIS — I129 Hypertensive chronic kidney disease with stage 1 through stage 4 chronic kidney disease, or unspecified chronic kidney disease: Secondary | ICD-10-CM | POA: Diagnosis not present

## 2017-05-20 DIAGNOSIS — G4733 Obstructive sleep apnea (adult) (pediatric): Secondary | ICD-10-CM | POA: Diagnosis not present

## 2017-05-20 DIAGNOSIS — E782 Mixed hyperlipidemia: Secondary | ICD-10-CM | POA: Diagnosis not present

## 2017-05-20 DIAGNOSIS — N400 Enlarged prostate without lower urinary tract symptoms: Secondary | ICD-10-CM | POA: Diagnosis not present

## 2017-05-20 DIAGNOSIS — E559 Vitamin D deficiency, unspecified: Secondary | ICD-10-CM | POA: Diagnosis not present

## 2017-05-20 DIAGNOSIS — E1122 Type 2 diabetes mellitus with diabetic chronic kidney disease: Secondary | ICD-10-CM | POA: Diagnosis not present

## 2017-05-23 MED FILL — ACCU-CHEK GUIDE TEST STRIP: 50 days supply | Qty: 100 | Fill #0

## 2017-05-23 MED FILL — ACCU-CHEK FASTCLIX LANCETS: 25 days supply | Qty: 102 | Fill #0

## 2017-06-16 DIAGNOSIS — Z6827 Body mass index (BMI) 27.0-27.9, adult: Secondary | ICD-10-CM | POA: Diagnosis not present

## 2017-06-16 DIAGNOSIS — E038 Other specified hypothyroidism: Secondary | ICD-10-CM | POA: Diagnosis not present

## 2017-06-16 DIAGNOSIS — N2581 Secondary hyperparathyroidism of renal origin: Secondary | ICD-10-CM | POA: Diagnosis not present

## 2017-06-16 DIAGNOSIS — N183 Chronic kidney disease, stage 3 (moderate): Secondary | ICD-10-CM | POA: Diagnosis not present

## 2017-06-16 DIAGNOSIS — N411 Chronic prostatitis: Secondary | ICD-10-CM | POA: Diagnosis not present

## 2017-06-16 DIAGNOSIS — E063 Autoimmune thyroiditis: Secondary | ICD-10-CM | POA: Diagnosis not present

## 2017-06-16 DIAGNOSIS — E1122 Type 2 diabetes mellitus with diabetic chronic kidney disease: Secondary | ICD-10-CM | POA: Diagnosis not present

## 2017-08-23 MED FILL — UNIFINE PENTIPS 32GX5/32: 32G X 4 MM | 90 days supply | Qty: 100 | Fill #0

## 2017-08-23 MED FILL — UNIFINE PENTIPS 32GX5/32": 32G X 4 MM | 90 days supply | Qty: 100 | Fill #0

## 2017-08-29 DIAGNOSIS — M159 Polyosteoarthritis, unspecified: Secondary | ICD-10-CM | POA: Diagnosis not present

## 2017-08-29 DIAGNOSIS — E1122 Type 2 diabetes mellitus with diabetic chronic kidney disease: Secondary | ICD-10-CM | POA: Diagnosis not present

## 2017-08-29 DIAGNOSIS — E782 Mixed hyperlipidemia: Secondary | ICD-10-CM | POA: Diagnosis not present

## 2017-08-29 DIAGNOSIS — E230 Hypopituitarism: Secondary | ICD-10-CM | POA: Diagnosis not present

## 2017-08-29 DIAGNOSIS — N183 Chronic kidney disease, stage 3 (moderate): Secondary | ICD-10-CM | POA: Diagnosis not present

## 2017-08-29 DIAGNOSIS — N400 Enlarged prostate without lower urinary tract symptoms: Secondary | ICD-10-CM | POA: Diagnosis not present

## 2017-08-29 DIAGNOSIS — E039 Hypothyroidism, unspecified: Secondary | ICD-10-CM | POA: Diagnosis not present

## 2017-08-29 DIAGNOSIS — E559 Vitamin D deficiency, unspecified: Secondary | ICD-10-CM | POA: Diagnosis not present

## 2017-08-29 DIAGNOSIS — G4733 Obstructive sleep apnea (adult) (pediatric): Secondary | ICD-10-CM | POA: Diagnosis not present

## 2017-08-29 DIAGNOSIS — E0580 Other thyrotoxicosis without thyrotoxic crisis or storm: Secondary | ICD-10-CM | POA: Diagnosis not present

## 2017-08-29 DIAGNOSIS — E790 Hyperuricemia without signs of inflammatory arthritis and tophaceous disease: Secondary | ICD-10-CM | POA: Diagnosis not present

## 2017-08-29 DIAGNOSIS — I129 Hypertensive chronic kidney disease with stage 1 through stage 4 chronic kidney disease, or unspecified chronic kidney disease: Secondary | ICD-10-CM | POA: Diagnosis not present

## 2017-09-09 DIAGNOSIS — G4733 Obstructive sleep apnea (adult) (pediatric): Secondary | ICD-10-CM | POA: Diagnosis not present

## 2017-09-09 DIAGNOSIS — S42209A Unspecified fracture of upper end of unspecified humerus, initial encounter for closed fracture: Secondary | ICD-10-CM | POA: Diagnosis not present

## 2017-09-16 DIAGNOSIS — E1122 Type 2 diabetes mellitus with diabetic chronic kidney disease: Secondary | ICD-10-CM | POA: Diagnosis not present

## 2017-09-16 DIAGNOSIS — N183 Chronic kidney disease, stage 3 (moderate): Secondary | ICD-10-CM | POA: Diagnosis not present

## 2017-09-16 DIAGNOSIS — N2581 Secondary hyperparathyroidism of renal origin: Secondary | ICD-10-CM | POA: Diagnosis not present

## 2017-09-16 DIAGNOSIS — Z6827 Body mass index (BMI) 27.0-27.9, adult: Secondary | ICD-10-CM | POA: Diagnosis not present

## 2017-09-16 DIAGNOSIS — N411 Chronic prostatitis: Secondary | ICD-10-CM | POA: Diagnosis not present

## 2017-09-16 DIAGNOSIS — E063 Autoimmune thyroiditis: Secondary | ICD-10-CM | POA: Diagnosis not present

## 2017-09-16 DIAGNOSIS — E038 Other specified hypothyroidism: Secondary | ICD-10-CM | POA: Diagnosis not present

## 2017-12-01 DIAGNOSIS — E782 Mixed hyperlipidemia: Secondary | ICD-10-CM | POA: Diagnosis not present

## 2017-12-01 DIAGNOSIS — E559 Vitamin D deficiency, unspecified: Secondary | ICD-10-CM | POA: Diagnosis not present

## 2017-12-01 DIAGNOSIS — E1122 Type 2 diabetes mellitus with diabetic chronic kidney disease: Secondary | ICD-10-CM | POA: Diagnosis not present

## 2017-12-01 DIAGNOSIS — I129 Hypertensive chronic kidney disease with stage 1 through stage 4 chronic kidney disease, or unspecified chronic kidney disease: Secondary | ICD-10-CM | POA: Diagnosis not present

## 2017-12-01 DIAGNOSIS — E790 Hyperuricemia without signs of inflammatory arthritis and tophaceous disease: Secondary | ICD-10-CM | POA: Diagnosis not present

## 2017-12-01 DIAGNOSIS — E039 Hypothyroidism, unspecified: Secondary | ICD-10-CM | POA: Diagnosis not present

## 2017-12-01 DIAGNOSIS — N400 Enlarged prostate without lower urinary tract symptoms: Secondary | ICD-10-CM | POA: Diagnosis not present

## 2017-12-01 DIAGNOSIS — M159 Polyosteoarthritis, unspecified: Secondary | ICD-10-CM | POA: Diagnosis not present

## 2017-12-01 DIAGNOSIS — E0580 Other thyrotoxicosis without thyrotoxic crisis or storm: Secondary | ICD-10-CM | POA: Diagnosis not present

## 2017-12-01 DIAGNOSIS — N183 Chronic kidney disease, stage 3 (moderate): Secondary | ICD-10-CM | POA: Diagnosis not present

## 2017-12-01 DIAGNOSIS — E230 Hypopituitarism: Secondary | ICD-10-CM | POA: Diagnosis not present

## 2017-12-01 DIAGNOSIS — G4733 Obstructive sleep apnea (adult) (pediatric): Secondary | ICD-10-CM | POA: Diagnosis not present

## 2017-12-02 DIAGNOSIS — D225 Melanocytic nevi of trunk: Secondary | ICD-10-CM | POA: Diagnosis not present

## 2017-12-02 DIAGNOSIS — Z85828 Personal history of other malignant neoplasm of skin: Secondary | ICD-10-CM | POA: Diagnosis not present

## 2017-12-02 DIAGNOSIS — L821 Other seborrheic keratosis: Secondary | ICD-10-CM | POA: Diagnosis not present

## 2017-12-02 DIAGNOSIS — D2271 Melanocytic nevi of right lower limb, including hip: Secondary | ICD-10-CM | POA: Diagnosis not present

## 2017-12-02 DIAGNOSIS — D1801 Hemangioma of skin and subcutaneous tissue: Secondary | ICD-10-CM | POA: Diagnosis not present

## 2017-12-02 DIAGNOSIS — D2272 Melanocytic nevi of left lower limb, including hip: Secondary | ICD-10-CM | POA: Diagnosis not present

## 2017-12-19 MED FILL — UNIFINE PENTIPS 32GX5/32": 32G X 4 MM | 90 days supply | Qty: 100 | Fill #1

## 2017-12-19 MED FILL — UNIFINE PENTIPS 32GX5/32: 32G X 4 MM | 90 days supply | Qty: 100 | Fill #1

## 2017-12-23 DIAGNOSIS — M25571 Pain in right ankle and joints of right foot: Secondary | ICD-10-CM | POA: Diagnosis not present

## 2017-12-23 DIAGNOSIS — M25572 Pain in left ankle and joints of left foot: Secondary | ICD-10-CM | POA: Diagnosis not present

## 2017-12-28 DIAGNOSIS — M25571 Pain in right ankle and joints of right foot: Secondary | ICD-10-CM | POA: Diagnosis not present

## 2018-02-24 DIAGNOSIS — N411 Chronic prostatitis: Secondary | ICD-10-CM | POA: Diagnosis not present

## 2018-02-24 DIAGNOSIS — E291 Testicular hypofunction: Secondary | ICD-10-CM | POA: Diagnosis not present

## 2018-02-24 DIAGNOSIS — N2581 Secondary hyperparathyroidism of renal origin: Secondary | ICD-10-CM | POA: Diagnosis not present

## 2018-02-24 DIAGNOSIS — E7800 Pure hypercholesterolemia, unspecified: Secondary | ICD-10-CM | POA: Diagnosis not present

## 2018-02-24 DIAGNOSIS — E038 Other specified hypothyroidism: Secondary | ICD-10-CM | POA: Diagnosis not present

## 2018-02-24 DIAGNOSIS — Z6827 Body mass index (BMI) 27.0-27.9, adult: Secondary | ICD-10-CM | POA: Diagnosis not present

## 2018-02-24 DIAGNOSIS — E063 Autoimmune thyroiditis: Secondary | ICD-10-CM | POA: Diagnosis not present

## 2018-02-24 DIAGNOSIS — E1122 Type 2 diabetes mellitus with diabetic chronic kidney disease: Secondary | ICD-10-CM | POA: Diagnosis not present

## 2018-02-24 DIAGNOSIS — E1129 Type 2 diabetes mellitus with other diabetic kidney complication: Secondary | ICD-10-CM | POA: Diagnosis not present

## 2018-02-24 DIAGNOSIS — N183 Chronic kidney disease, stage 3 (moderate): Secondary | ICD-10-CM | POA: Diagnosis not present

## 2018-03-03 DIAGNOSIS — H524 Presbyopia: Secondary | ICD-10-CM | POA: Diagnosis not present

## 2018-03-10 DIAGNOSIS — I129 Hypertensive chronic kidney disease with stage 1 through stage 4 chronic kidney disease, or unspecified chronic kidney disease: Secondary | ICD-10-CM | POA: Diagnosis not present

## 2018-03-10 DIAGNOSIS — E559 Vitamin D deficiency, unspecified: Secondary | ICD-10-CM | POA: Diagnosis not present

## 2018-03-10 DIAGNOSIS — E790 Hyperuricemia without signs of inflammatory arthritis and tophaceous disease: Secondary | ICD-10-CM | POA: Diagnosis not present

## 2018-03-10 DIAGNOSIS — M159 Polyosteoarthritis, unspecified: Secondary | ICD-10-CM | POA: Diagnosis not present

## 2018-03-10 DIAGNOSIS — E230 Hypopituitarism: Secondary | ICD-10-CM | POA: Diagnosis not present

## 2018-03-10 DIAGNOSIS — E119 Type 2 diabetes mellitus without complications: Secondary | ICD-10-CM | POA: Diagnosis not present

## 2018-03-10 DIAGNOSIS — E0580 Other thyrotoxicosis without thyrotoxic crisis or storm: Secondary | ICD-10-CM | POA: Diagnosis not present

## 2018-03-10 DIAGNOSIS — N183 Chronic kidney disease, stage 3 (moderate): Secondary | ICD-10-CM | POA: Diagnosis not present

## 2018-03-10 DIAGNOSIS — E039 Hypothyroidism, unspecified: Secondary | ICD-10-CM | POA: Diagnosis not present

## 2018-03-10 DIAGNOSIS — E782 Mixed hyperlipidemia: Secondary | ICD-10-CM | POA: Diagnosis not present

## 2018-03-10 DIAGNOSIS — G4733 Obstructive sleep apnea (adult) (pediatric): Secondary | ICD-10-CM | POA: Diagnosis not present

## 2018-03-10 DIAGNOSIS — N400 Enlarged prostate without lower urinary tract symptoms: Secondary | ICD-10-CM | POA: Diagnosis not present

## 2018-03-10 MED FILL — ACCU-CHEK GUIDE TEST STRIP: 50 days supply | Qty: 100 | Fill #1

## 2018-04-18 MED FILL — UNIFINE PENTIPS 32GX5/32: 32G X 4 MM | 90 days supply | Qty: 100 | Fill #2

## 2018-06-16 DIAGNOSIS — E039 Hypothyroidism, unspecified: Secondary | ICD-10-CM | POA: Diagnosis not present

## 2018-06-16 DIAGNOSIS — G4733 Obstructive sleep apnea (adult) (pediatric): Secondary | ICD-10-CM | POA: Diagnosis not present

## 2018-06-16 DIAGNOSIS — E119 Type 2 diabetes mellitus without complications: Secondary | ICD-10-CM | POA: Diagnosis not present

## 2018-06-16 DIAGNOSIS — M159 Polyosteoarthritis, unspecified: Secondary | ICD-10-CM | POA: Diagnosis not present

## 2018-06-16 DIAGNOSIS — I129 Hypertensive chronic kidney disease with stage 1 through stage 4 chronic kidney disease, or unspecified chronic kidney disease: Secondary | ICD-10-CM | POA: Diagnosis not present

## 2018-06-16 DIAGNOSIS — E0580 Other thyrotoxicosis without thyrotoxic crisis or storm: Secondary | ICD-10-CM | POA: Diagnosis not present

## 2018-06-16 DIAGNOSIS — E790 Hyperuricemia without signs of inflammatory arthritis and tophaceous disease: Secondary | ICD-10-CM | POA: Diagnosis not present

## 2018-06-16 DIAGNOSIS — N183 Chronic kidney disease, stage 3 (moderate): Secondary | ICD-10-CM | POA: Diagnosis not present

## 2018-06-16 DIAGNOSIS — E782 Mixed hyperlipidemia: Secondary | ICD-10-CM | POA: Diagnosis not present

## 2018-06-16 DIAGNOSIS — E230 Hypopituitarism: Secondary | ICD-10-CM | POA: Diagnosis not present

## 2018-06-16 DIAGNOSIS — E559 Vitamin D deficiency, unspecified: Secondary | ICD-10-CM | POA: Diagnosis not present

## 2018-06-16 DIAGNOSIS — N400 Enlarged prostate without lower urinary tract symptoms: Secondary | ICD-10-CM | POA: Diagnosis not present

## 2018-08-04 MED FILL — UNIFINE PENTIPS 32GX5/32": 32G X 4 MM | 90 days supply | Qty: 100 | Fill #3

## 2018-08-04 MED FILL — ACCU-CHEK GUIDE STRP: 90 days supply | Qty: 200 | Fill #0

## 2018-08-04 MED FILL — UNIFINE PENTIPS 32GX5/32: 32G X 4 MM | 90 days supply | Qty: 100 | Fill #3

## 2018-09-08 DIAGNOSIS — I10 Essential (primary) hypertension: Secondary | ICD-10-CM | POA: Diagnosis not present

## 2018-09-08 DIAGNOSIS — E782 Mixed hyperlipidemia: Secondary | ICD-10-CM | POA: Diagnosis not present

## 2018-09-08 DIAGNOSIS — E119 Type 2 diabetes mellitus without complications: Secondary | ICD-10-CM | POA: Diagnosis not present

## 2018-09-08 DIAGNOSIS — N419 Inflammatory disease of prostate, unspecified: Secondary | ICD-10-CM | POA: Diagnosis not present

## 2018-09-08 DIAGNOSIS — N183 Chronic kidney disease, stage 3 (moderate): Secondary | ICD-10-CM | POA: Diagnosis not present

## 2018-09-08 DIAGNOSIS — Z125 Encounter for screening for malignant neoplasm of prostate: Secondary | ICD-10-CM | POA: Diagnosis not present

## 2018-09-08 DIAGNOSIS — E038 Other specified hypothyroidism: Secondary | ICD-10-CM | POA: Diagnosis not present

## 2018-09-15 DIAGNOSIS — I10 Essential (primary) hypertension: Secondary | ICD-10-CM | POA: Diagnosis not present

## 2018-09-15 DIAGNOSIS — E230 Hypopituitarism: Secondary | ICD-10-CM | POA: Diagnosis not present

## 2018-09-15 DIAGNOSIS — E559 Vitamin D deficiency, unspecified: Secondary | ICD-10-CM | POA: Diagnosis not present

## 2018-09-15 DIAGNOSIS — E782 Mixed hyperlipidemia: Secondary | ICD-10-CM | POA: Diagnosis not present

## 2018-09-15 DIAGNOSIS — N183 Chronic kidney disease, stage 3 (moderate): Secondary | ICD-10-CM | POA: Diagnosis not present

## 2018-09-15 DIAGNOSIS — E0580 Other thyrotoxicosis without thyrotoxic crisis or storm: Secondary | ICD-10-CM | POA: Diagnosis not present

## 2018-09-15 DIAGNOSIS — G4733 Obstructive sleep apnea (adult) (pediatric): Secondary | ICD-10-CM | POA: Diagnosis not present

## 2018-09-15 DIAGNOSIS — E039 Hypothyroidism, unspecified: Secondary | ICD-10-CM | POA: Diagnosis not present

## 2018-09-15 DIAGNOSIS — E119 Type 2 diabetes mellitus without complications: Secondary | ICD-10-CM | POA: Diagnosis not present

## 2018-09-26 DIAGNOSIS — N2581 Secondary hyperparathyroidism of renal origin: Secondary | ICD-10-CM | POA: Diagnosis not present

## 2018-09-26 DIAGNOSIS — E038 Other specified hypothyroidism: Secondary | ICD-10-CM | POA: Diagnosis not present

## 2018-09-26 DIAGNOSIS — N183 Chronic kidney disease, stage 3 (moderate): Secondary | ICD-10-CM | POA: Diagnosis not present

## 2018-09-26 DIAGNOSIS — Z6827 Body mass index (BMI) 27.0-27.9, adult: Secondary | ICD-10-CM | POA: Diagnosis not present

## 2018-09-26 DIAGNOSIS — E7800 Pure hypercholesterolemia, unspecified: Secondary | ICD-10-CM | POA: Diagnosis not present

## 2018-09-26 DIAGNOSIS — E063 Autoimmune thyroiditis: Secondary | ICD-10-CM | POA: Diagnosis not present

## 2018-09-26 DIAGNOSIS — E1122 Type 2 diabetes mellitus with diabetic chronic kidney disease: Secondary | ICD-10-CM | POA: Diagnosis not present

## 2018-09-26 DIAGNOSIS — N411 Chronic prostatitis: Secondary | ICD-10-CM | POA: Diagnosis not present

## 2018-09-26 DIAGNOSIS — E291 Testicular hypofunction: Secondary | ICD-10-CM | POA: Diagnosis not present

## 2018-12-06 MED FILL — UNIFINE PENTIPS 32GX5/32: 32G X 4 MM | 25 days supply | Qty: 100 | Fill #0

## 2018-12-21 MED FILL — UNIFINE PENTIPS 32GX5/32: 32G X 4 MM | 25 days supply | Qty: 100 | Fill #0

## 2018-12-21 MED FILL — UNIFINE PENTIPS 32GX5/32": 32G X 4 MM | 25 days supply | Qty: 100 | Fill #0

## 2019-01-12 DIAGNOSIS — L570 Actinic keratosis: Secondary | ICD-10-CM | POA: Diagnosis not present

## 2019-01-12 DIAGNOSIS — L821 Other seborrheic keratosis: Secondary | ICD-10-CM | POA: Diagnosis not present

## 2019-01-12 DIAGNOSIS — L218 Other seborrheic dermatitis: Secondary | ICD-10-CM | POA: Diagnosis not present

## 2019-01-12 DIAGNOSIS — D1801 Hemangioma of skin and subcutaneous tissue: Secondary | ICD-10-CM | POA: Diagnosis not present

## 2019-01-12 DIAGNOSIS — D225 Melanocytic nevi of trunk: Secondary | ICD-10-CM | POA: Diagnosis not present

## 2019-01-12 DIAGNOSIS — Z85828 Personal history of other malignant neoplasm of skin: Secondary | ICD-10-CM | POA: Diagnosis not present

## 2019-01-12 DIAGNOSIS — D2271 Melanocytic nevi of right lower limb, including hip: Secondary | ICD-10-CM | POA: Diagnosis not present

## 2019-01-12 DIAGNOSIS — L920 Granuloma annulare: Secondary | ICD-10-CM | POA: Diagnosis not present

## 2019-03-02 DIAGNOSIS — H5203 Hypermetropia, bilateral: Secondary | ICD-10-CM | POA: Diagnosis not present

## 2019-03-12 DIAGNOSIS — E038 Other specified hypothyroidism: Secondary | ICD-10-CM | POA: Diagnosis not present

## 2019-03-12 DIAGNOSIS — E063 Autoimmune thyroiditis: Secondary | ICD-10-CM | POA: Diagnosis not present

## 2019-03-12 DIAGNOSIS — N411 Chronic prostatitis: Secondary | ICD-10-CM | POA: Diagnosis not present

## 2019-03-12 DIAGNOSIS — E1129 Type 2 diabetes mellitus with other diabetic kidney complication: Secondary | ICD-10-CM | POA: Diagnosis not present

## 2019-03-12 DIAGNOSIS — E7800 Pure hypercholesterolemia, unspecified: Secondary | ICD-10-CM | POA: Diagnosis not present

## 2019-03-12 DIAGNOSIS — E291 Testicular hypofunction: Secondary | ICD-10-CM | POA: Diagnosis not present

## 2019-03-12 DIAGNOSIS — Z6827 Body mass index (BMI) 27.0-27.9, adult: Secondary | ICD-10-CM | POA: Diagnosis not present

## 2019-03-12 DIAGNOSIS — N2581 Secondary hyperparathyroidism of renal origin: Secondary | ICD-10-CM | POA: Diagnosis not present

## 2019-03-12 DIAGNOSIS — N183 Chronic kidney disease, stage 3 unspecified: Secondary | ICD-10-CM | POA: Diagnosis not present

## 2019-03-15 DIAGNOSIS — H6123 Impacted cerumen, bilateral: Secondary | ICD-10-CM | POA: Diagnosis not present

## 2019-03-16 DIAGNOSIS — E782 Mixed hyperlipidemia: Secondary | ICD-10-CM | POA: Diagnosis not present

## 2019-03-16 DIAGNOSIS — E038 Other specified hypothyroidism: Secondary | ICD-10-CM | POA: Diagnosis not present

## 2019-03-16 DIAGNOSIS — I10 Essential (primary) hypertension: Secondary | ICD-10-CM | POA: Diagnosis not present

## 2019-03-16 DIAGNOSIS — E230 Hypopituitarism: Secondary | ICD-10-CM | POA: Diagnosis not present

## 2019-03-16 DIAGNOSIS — E559 Vitamin D deficiency, unspecified: Secondary | ICD-10-CM | POA: Diagnosis not present

## 2019-03-16 DIAGNOSIS — E790 Hyperuricemia without signs of inflammatory arthritis and tophaceous disease: Secondary | ICD-10-CM | POA: Diagnosis not present

## 2019-03-16 DIAGNOSIS — E119 Type 2 diabetes mellitus without complications: Secondary | ICD-10-CM | POA: Diagnosis not present

## 2019-03-16 DIAGNOSIS — N183 Chronic kidney disease, stage 3 unspecified: Secondary | ICD-10-CM | POA: Diagnosis not present

## 2019-03-20 DIAGNOSIS — G4733 Obstructive sleep apnea (adult) (pediatric): Secondary | ICD-10-CM | POA: Diagnosis not present

## 2019-03-20 DIAGNOSIS — E063 Autoimmune thyroiditis: Secondary | ICD-10-CM | POA: Diagnosis not present

## 2019-03-20 DIAGNOSIS — E782 Mixed hyperlipidemia: Secondary | ICD-10-CM | POA: Diagnosis not present

## 2019-03-20 DIAGNOSIS — N1831 Chronic kidney disease, stage 3a: Secondary | ICD-10-CM | POA: Diagnosis not present

## 2019-03-20 DIAGNOSIS — E1122 Type 2 diabetes mellitus with diabetic chronic kidney disease: Secondary | ICD-10-CM | POA: Diagnosis not present

## 2019-03-20 DIAGNOSIS — E559 Vitamin D deficiency, unspecified: Secondary | ICD-10-CM | POA: Diagnosis not present

## 2019-03-20 DIAGNOSIS — I129 Hypertensive chronic kidney disease with stage 1 through stage 4 chronic kidney disease, or unspecified chronic kidney disease: Secondary | ICD-10-CM | POA: Diagnosis not present

## 2019-03-20 DIAGNOSIS — E230 Hypopituitarism: Secondary | ICD-10-CM | POA: Diagnosis not present

## 2019-03-20 DIAGNOSIS — E039 Hypothyroidism, unspecified: Secondary | ICD-10-CM | POA: Diagnosis not present

## 2019-04-05 MED FILL — UNIFINE PENTIPS 32GX5/32: 32G X 4 MM | 25 days supply | Qty: 100 | Fill #1

## 2019-04-05 MED FILL — UNIFINE PENTIPS 32GX5/32": 32G X 4 MM | 25 days supply | Qty: 100 | Fill #1

## 2019-04-30 DIAGNOSIS — S86311A Strain of muscle(s) and tendon(s) of peroneal muscle group at lower leg level, right leg, initial encounter: Secondary | ICD-10-CM | POA: Diagnosis not present

## 2019-05-08 ENCOUNTER — Encounter: Payer: 59 | Admitting: Sports Medicine

## 2019-05-08 ENCOUNTER — Other Ambulatory Visit: Payer: Self-pay

## 2019-05-08 ENCOUNTER — Ambulatory Visit (INDEPENDENT_AMBULATORY_CARE_PROVIDER_SITE_OTHER): Payer: 59 | Admitting: Sports Medicine

## 2019-05-08 ENCOUNTER — Encounter: Payer: Self-pay | Admitting: Sports Medicine

## 2019-05-08 VITALS — BP 120/60 | Ht 65.0 in | Wt 150.0 lb

## 2019-05-08 DIAGNOSIS — Q667 Congenital pes cavus, unspecified foot: Secondary | ICD-10-CM

## 2019-05-08 NOTE — Progress Notes (Signed)
   Subjective:    Patient ID: [name], male    DOB: 1945/10/26, 73 y.o.   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 Noemi Carey for possible custom orthotics.  He suffered an inversion injury to the right ankle recently.  Saw Dr. Noemi Carey and was fitted with an ASO brace.  This has made his ankle feel better.  X-rays were negative for fracture.  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  He has a history of plantar fasciitis.  He is tried off-the-shelf inserts in the past.  His primary means of exercise is walking.  Past medical history reviewed on the chart Medications reviewed Allergies reviewed    Review of Systems    As above Objective:   Physical Exam  Well-developed, well-nourished.  No acute distress.  Awake alert and oriented x3.  Vital signs reviewed.  Examination of the right ankle shows good range of motion.  No obvious effusion.  No soft tissue swelling.  Negative talar tilt, negative anterior drawer.  No significant tenderness to palpation. Evaluation of his feet in the standing position shows a cavus foot with a wide forefoot.  Evaluation of his gait shows no significant pronation or supination.      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  He found them to be comfortable prior to leaving the office.  Follow-up with Dr. Noemi Carey as scheduled.  He may follow-up with [place] as needed.  Patient was fitted for a : standard, cushioned, semi-rigid orthotic. The orthotic was heated and afterward the patient stood on the orthotic blank positioned on the orthotic stand. The patient was positioned in subtalar neutral position and 10 degrees of ankle dorsiflexion in a weight bearing stance. After completion of molding, a stable base was applied to the orthotic blank. The blank was ground to a stable position for weight  bearing. Size: 8 Base: Blue EVA Posting: none Additional orthotic padding: none

## 2019-05-17 MED FILL — FREESTYLE LANCETS: 90 days supply | Qty: 200 | Fill #0

## 2019-05-17 MED FILL — FREESTYLE LITE TEST STRIP: 90 days supply | Qty: 200 | Fill #0

## 2019-05-17 MED FILL — FREESTYLE LITE METER: 30 days supply | Qty: 1 | Fill #0

## 2019-05-28 DIAGNOSIS — M25571 Pain in right ankle and joints of right foot: Secondary | ICD-10-CM | POA: Diagnosis not present

## 2019-06-01 DIAGNOSIS — N183 Chronic kidney disease, stage 3 unspecified: Secondary | ICD-10-CM | POA: Diagnosis not present

## 2019-06-01 DIAGNOSIS — I10 Essential (primary) hypertension: Secondary | ICD-10-CM | POA: Diagnosis not present

## 2019-06-01 DIAGNOSIS — R300 Dysuria: Secondary | ICD-10-CM | POA: Diagnosis not present

## 2019-06-01 DIAGNOSIS — E038 Other specified hypothyroidism: Secondary | ICD-10-CM | POA: Diagnosis not present

## 2019-06-28 DIAGNOSIS — E790 Hyperuricemia without signs of inflammatory arthritis and tophaceous disease: Secondary | ICD-10-CM | POA: Diagnosis not present

## 2019-06-28 DIAGNOSIS — I129 Hypertensive chronic kidney disease with stage 1 through stage 4 chronic kidney disease, or unspecified chronic kidney disease: Secondary | ICD-10-CM | POA: Diagnosis not present

## 2019-06-28 DIAGNOSIS — E782 Mixed hyperlipidemia: Secondary | ICD-10-CM | POA: Diagnosis not present

## 2019-06-28 DIAGNOSIS — E063 Autoimmune thyroiditis: Secondary | ICD-10-CM | POA: Diagnosis not present

## 2019-06-28 DIAGNOSIS — N1831 Chronic kidney disease, stage 3a: Secondary | ICD-10-CM | POA: Diagnosis not present

## 2019-06-28 DIAGNOSIS — E559 Vitamin D deficiency, unspecified: Secondary | ICD-10-CM | POA: Diagnosis not present

## 2019-06-28 DIAGNOSIS — G4733 Obstructive sleep apnea (adult) (pediatric): Secondary | ICD-10-CM | POA: Diagnosis not present

## 2019-06-28 DIAGNOSIS — E039 Hypothyroidism, unspecified: Secondary | ICD-10-CM | POA: Diagnosis not present

## 2019-06-28 DIAGNOSIS — N400 Enlarged prostate without lower urinary tract symptoms: Secondary | ICD-10-CM | POA: Diagnosis not present

## 2019-06-28 DIAGNOSIS — E1122 Type 2 diabetes mellitus with diabetic chronic kidney disease: Secondary | ICD-10-CM | POA: Diagnosis not present

## 2019-06-28 DIAGNOSIS — E230 Hypopituitarism: Secondary | ICD-10-CM | POA: Diagnosis not present

## 2019-09-12 DIAGNOSIS — E790 Hyperuricemia without signs of inflammatory arthritis and tophaceous disease: Secondary | ICD-10-CM | POA: Diagnosis not present

## 2019-09-12 DIAGNOSIS — E038 Other specified hypothyroidism: Secondary | ICD-10-CM | POA: Diagnosis not present

## 2019-09-12 DIAGNOSIS — E119 Type 2 diabetes mellitus without complications: Secondary | ICD-10-CM | POA: Diagnosis not present

## 2019-09-12 DIAGNOSIS — Z125 Encounter for screening for malignant neoplasm of prostate: Secondary | ICD-10-CM | POA: Diagnosis not present

## 2019-09-12 DIAGNOSIS — R300 Dysuria: Secondary | ICD-10-CM | POA: Diagnosis not present

## 2019-09-12 DIAGNOSIS — I10 Essential (primary) hypertension: Secondary | ICD-10-CM | POA: Diagnosis not present

## 2019-09-12 DIAGNOSIS — E230 Hypopituitarism: Secondary | ICD-10-CM | POA: Diagnosis not present

## 2019-09-12 DIAGNOSIS — N183 Chronic kidney disease, stage 3 unspecified: Secondary | ICD-10-CM | POA: Diagnosis not present

## 2019-09-12 DIAGNOSIS — E782 Mixed hyperlipidemia: Secondary | ICD-10-CM | POA: Diagnosis not present

## 2019-09-12 DIAGNOSIS — E559 Vitamin D deficiency, unspecified: Secondary | ICD-10-CM | POA: Diagnosis not present

## 2019-09-27 DIAGNOSIS — I129 Hypertensive chronic kidney disease with stage 1 through stage 4 chronic kidney disease, or unspecified chronic kidney disease: Secondary | ICD-10-CM | POA: Diagnosis not present

## 2019-09-27 DIAGNOSIS — E782 Mixed hyperlipidemia: Secondary | ICD-10-CM | POA: Diagnosis not present

## 2019-09-27 DIAGNOSIS — E230 Hypopituitarism: Secondary | ICD-10-CM | POA: Diagnosis not present

## 2019-09-27 DIAGNOSIS — E039 Hypothyroidism, unspecified: Secondary | ICD-10-CM | POA: Diagnosis not present

## 2019-09-27 DIAGNOSIS — E559 Vitamin D deficiency, unspecified: Secondary | ICD-10-CM | POA: Diagnosis not present

## 2019-09-27 DIAGNOSIS — N400 Enlarged prostate without lower urinary tract symptoms: Secondary | ICD-10-CM | POA: Diagnosis not present

## 2019-09-27 DIAGNOSIS — E790 Hyperuricemia without signs of inflammatory arthritis and tophaceous disease: Secondary | ICD-10-CM | POA: Diagnosis not present

## 2019-09-27 DIAGNOSIS — E063 Autoimmune thyroiditis: Secondary | ICD-10-CM | POA: Diagnosis not present

## 2019-09-27 DIAGNOSIS — G4733 Obstructive sleep apnea (adult) (pediatric): Secondary | ICD-10-CM | POA: Diagnosis not present

## 2019-09-27 DIAGNOSIS — N1831 Chronic kidney disease, stage 3a: Secondary | ICD-10-CM | POA: Diagnosis not present

## 2019-09-27 DIAGNOSIS — E1122 Type 2 diabetes mellitus with diabetic chronic kidney disease: Secondary | ICD-10-CM | POA: Diagnosis not present

## 2019-11-07 DIAGNOSIS — E063 Autoimmune thyroiditis: Secondary | ICD-10-CM | POA: Diagnosis not present

## 2019-11-07 DIAGNOSIS — E038 Other specified hypothyroidism: Secondary | ICD-10-CM | POA: Diagnosis not present

## 2019-11-07 DIAGNOSIS — N2581 Secondary hyperparathyroidism of renal origin: Secondary | ICD-10-CM | POA: Diagnosis not present

## 2019-11-07 DIAGNOSIS — E7800 Pure hypercholesterolemia, unspecified: Secondary | ICD-10-CM | POA: Diagnosis not present

## 2019-11-07 DIAGNOSIS — N183 Chronic kidney disease, stage 3 unspecified: Secondary | ICD-10-CM | POA: Diagnosis not present

## 2019-11-07 DIAGNOSIS — E1129 Type 2 diabetes mellitus with other diabetic kidney complication: Secondary | ICD-10-CM | POA: Diagnosis not present

## 2019-11-14 DIAGNOSIS — E291 Testicular hypofunction: Secondary | ICD-10-CM | POA: Diagnosis not present

## 2019-11-14 DIAGNOSIS — R972 Elevated prostate specific antigen [PSA]: Secondary | ICD-10-CM | POA: Diagnosis not present

## 2019-11-18 ENCOUNTER — Emergency Department
Admission: EM | Admit: 2019-11-18 | Discharge: 2019-11-19 | Disposition: A | Discharge status: Home / Self Care | Payer: 59 | Attending: Emergency Medicine | Admitting: Emergency Medicine

## 2019-11-18 ENCOUNTER — Emergency Department: Payer: 59

## 2019-11-18 DIAGNOSIS — W228XXA Striking against or struck by other objects, initial encounter: Secondary | ICD-10-CM | POA: Diagnosis not present

## 2019-11-18 DIAGNOSIS — Z7982 Long term (current) use of aspirin: Secondary | ICD-10-CM | POA: Diagnosis not present

## 2019-11-18 DIAGNOSIS — Z7984 Long term (current) use of oral hypoglycemic drugs: Secondary | ICD-10-CM | POA: Diagnosis not present

## 2019-11-18 DIAGNOSIS — E119 Type 2 diabetes mellitus without complications: Secondary | ICD-10-CM | POA: Diagnosis not present

## 2019-11-18 DIAGNOSIS — S0121XA Laceration without foreign body of nose, initial encounter: Secondary | ICD-10-CM | POA: Diagnosis not present

## 2019-11-18 DIAGNOSIS — E785 Hyperlipidemia, unspecified: Secondary | ICD-10-CM | POA: Diagnosis not present

## 2019-11-18 DIAGNOSIS — I10 Essential (primary) hypertension: Secondary | ICD-10-CM | POA: Diagnosis not present

## 2019-11-18 DIAGNOSIS — R040 Epistaxis: Secondary | ICD-10-CM | POA: Diagnosis not present

## 2019-11-18 DIAGNOSIS — R569 Unspecified convulsions: Secondary | ICD-10-CM | POA: Diagnosis not present

## 2019-11-18 DIAGNOSIS — E079 Disorder of thyroid, unspecified: Secondary | ICD-10-CM | POA: Diagnosis not present

## 2019-11-18 DIAGNOSIS — S0990XA Unspecified injury of head, initial encounter: Secondary | ICD-10-CM | POA: Diagnosis not present

## 2019-11-18 HISTORY — DX: Gout, unspecified: M10.9

## 2019-11-18 HISTORY — DX: Disorder of thyroid, unspecified: E07.9

## 2019-11-18 HISTORY — DX: Hyperlipidemia, unspecified: E78.5

## 2019-11-18 HISTORY — DX: Type 2 diabetes mellitus without complications: E11.9

## 2019-11-18 HISTORY — DX: Unspecified convulsions: R56.9

## 2019-11-18 HISTORY — DX: Essential (primary) hypertension: I10

## 2019-11-18 MED ORDER — BACITRACIN +/- ZINC 500 UNIT/GM EX OINT (WRAP)
TOPICAL_OINTMENT | Freq: Once | CUTANEOUS | Status: AC
Start: 2019-11-18 — End: 2019-11-18
  Administered 2019-11-18: 1 g via TOPICAL
  Filled 2019-11-18: qty 1

## 2019-11-18 MED ORDER — DERMABOND (TOPICAL ADHESIVE)
Freq: Once | Status: DC
Start: 2019-11-18 — End: 2019-11-19

## 2019-11-18 MED ORDER — OXYMETAZOLINE HCL 0.05 % NA SOLN
2.0000 | Freq: Once | NASAL | Status: DC
Start: 2019-11-18 — End: 2019-11-19
  Administered 2019-11-18: 2 via NASAL
  Filled 2019-11-18: qty 30

## 2019-11-18 NOTE — ED Triage Notes (Signed)
PT reports hitting nose on glass door apx 45 minutes ago when daughter inlaw suddenly shut door to keep dogs in. No LOC, on aspirin and feels blood going down throat whenever he removes pressure
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
## 2019-11-18 NOTE — Discharge Instructions (Signed)
You were seen in the [facility] Emergency Department      For home care, please follow-up with your primary care doctor and ENT specialist in the next 1 to 2 days for reevaluation.  Call and make a follow up appointment today. Take your medication as directed and prescribed.     Please make sure to return if you have repeated nosebleed, sudden severe headache, numbness or weakness in arms or legs, fever greater than 100.4 F, repeated vomiting, chest pain, shortness of breath, dizziness, if you are not getting better, or any other new or worsening or concerning symptoms.  We are always open and happy to help.    Please be aware that an emergency department diagnosis is usually preliminary, and that a definitive diagnosis cannot be made without the help of time or further testing.  EVERY disease has a progression and may take time before it is recognizable by a physician.  It is Extremely important that you return to the Emergency Department or see your own doctor if you do not improve, or especially if your symptoms worsen or change.     In short, DO NOT HESITATE TO RETURN TO THE EMERGENCY DEPARTMENT IF YOU SENSE THAT SOMETHING IS NOT RIGHT! We are never upset to see you again.    Take your discharge instructions to your primary care doctor and all other follow-up care so they have a record of what happened in your visit today. Emergency care is not a substitute for general medical care.            Epistaxis    You have been seen for Epistaxis (bloody nose).    Epistaxis in the medical term for a bloody nose. Epistaxis is a common condition. While it may be scary, it is not often serious. The skin in the front of the nose is very fragile. In children, it is most often damaged by direct trauma (nose-picking) or when the skin is very dry or irritated (like in dry environments on when you have a cold).    Bleeding often starts at the end of the nose. Therefore, pinching the entire nose for 15  minutes often helps stop the bleeding. When the bleeding area can be found, the doctor may use a chemical to stop the bleeding. This chemical is called Silver Nitrate. Other chemicals are sometimes used to stop the bleeding. These chemicals cause the oozing blood to clot. You may have had one or both of these treatments today. If chemical treatment doesn't stop the bleeding, packing may be placed into the nose. Packing can be uncomfortable. However, it often really works at stopping a bloody nose.    Sometimes bleeding starts from a blood vessel way in the back of the nose or in the throat. This type of bleeding is hard to control. Nasal packing by an Ear, Nose and Throat specialist is often needed.    Putting ice on the forehead or back of the neck is not effective. It WILL NOT stop the bleeding. Instead, press or squeeze the nose directly. If bleeding doesnt stop in 15 minutes, come back here or go to the nearest Emergency Department.    Do not use aspirin or non-steroidal anti-inflammatory medicines like ibuprofen (Advil or Motrin), naproxen (Naprosyn or Aleve), etc. Also stay away from alcohol. These make it hard for your blood to clot.    Avoid blowing your nose, sneezing and straining for the next few days.    YOU SHOULD SEEK  MEDICAL ATTENTION IMMEDIATELY, EITHER HERE OR AT THE NEAREST EMERGENCY DEPARTMENT, IF ANY OF THE FOLLOWING OCCURS:   Bleeding doesn't stop with direct pressure.   Bleeding down the back of the throat.   The packing gets out of place.       Laceration, Tissue Glue    Your wound has been closed with tissue glue.    Tissue glue is a sterile, liquid skin glue that holds wound edges together. The film usually stays in place for 5 to 10 days. Afterwards, it naturally falls off of your skin.   There are 2 common brands of tissue glue: DERMABOND and INDERMIL.    Some swelling, redness, and pain are common with all wounds. This normally goes away as the wound heals. If there is  more swelling, redness, or pain or the wound feels warm to touch, talk to your doctor. If the wound edges open again, contact your doctor. If the wound edges separate, contact your doctor.    Do not scratch, rub, or pick at the tissue glue. This may loosen the film before the wound heals.    DO NOT put liquid or ointment medicines or any other product to the wound while the tissue glue is in place. This may loosen the film before the wound heals.    Protect the wound from being re-injured until the skin has had enough time to heal.    Every so often you may wet the wound quickly in the shower or bath. Until the tissue glue falls off on its own, do not soak or scrub your wound, do not swim and avoid periods of heavy activity that cause sweating. After showering or bathing, gently blot your wound dry with a soft towel. If using a protective dressing, put on a fresh, dry bandage. Keep tape off the tissue glue.    YOU SHOULD SEEK MEDICAL ATTENTION IMMEDIATELY, EITHER HERE OR AT THE NEAREST EMERGENCY DEPARTMENT, IF ANY OF THE FOLLOWING OCCURS:   The wound re-opens.   Redness develops around the wound.   The pain gets worse.   Fever (temperature higher than 100.42F / 38C).   Pus drains from the wound.   Swelling around the wound.

## 2019-11-28 ENCOUNTER — Other Ambulatory Visit: Payer: Self-pay | Admitting: Urology

## 2019-11-28 DIAGNOSIS — R300 Dysuria: Secondary | ICD-10-CM

## 2019-11-30 DIAGNOSIS — R972 Elevated prostate specific antigen [PSA]: Secondary | ICD-10-CM | POA: Diagnosis not present

## 2019-12-13 DIAGNOSIS — H6123 Impacted cerumen, bilateral: Secondary | ICD-10-CM | POA: Diagnosis not present

## 2019-12-31 DIAGNOSIS — N1831 Chronic kidney disease, stage 3a: Secondary | ICD-10-CM | POA: Diagnosis not present

## 2019-12-31 DIAGNOSIS — E782 Mixed hyperlipidemia: Secondary | ICD-10-CM | POA: Diagnosis not present

## 2019-12-31 DIAGNOSIS — I129 Hypertensive chronic kidney disease with stage 1 through stage 4 chronic kidney disease, or unspecified chronic kidney disease: Secondary | ICD-10-CM | POA: Diagnosis not present

## 2019-12-31 DIAGNOSIS — E559 Vitamin D deficiency, unspecified: Secondary | ICD-10-CM | POA: Diagnosis not present

## 2019-12-31 DIAGNOSIS — E0580 Other thyrotoxicosis without thyrotoxic crisis or storm: Secondary | ICD-10-CM | POA: Diagnosis not present

## 2019-12-31 DIAGNOSIS — N400 Enlarged prostate without lower urinary tract symptoms: Secondary | ICD-10-CM | POA: Diagnosis not present

## 2019-12-31 DIAGNOSIS — G4733 Obstructive sleep apnea (adult) (pediatric): Secondary | ICD-10-CM | POA: Diagnosis not present

## 2019-12-31 DIAGNOSIS — E1122 Type 2 diabetes mellitus with diabetic chronic kidney disease: Secondary | ICD-10-CM | POA: Diagnosis not present

## 2019-12-31 DIAGNOSIS — E039 Hypothyroidism, unspecified: Secondary | ICD-10-CM | POA: Diagnosis not present

## 2019-12-31 DIAGNOSIS — E790 Hyperuricemia without signs of inflammatory arthritis and tophaceous disease: Secondary | ICD-10-CM | POA: Diagnosis not present

## 2019-12-31 DIAGNOSIS — E230 Hypopituitarism: Secondary | ICD-10-CM | POA: Diagnosis not present

## 2019-12-31 DIAGNOSIS — E063 Autoimmune thyroiditis: Secondary | ICD-10-CM | POA: Diagnosis not present

## 2020-01-01 ENCOUNTER — Other Ambulatory Visit: Payer: Self-pay | Admitting: Urology

## 2020-01-01 DIAGNOSIS — R300 Dysuria: Secondary | ICD-10-CM

## 2020-01-07 DIAGNOSIS — S46012A Strain of muscle(s) and tendon(s) of the rotator cuff of left shoulder, initial encounter: Secondary | ICD-10-CM | POA: Diagnosis not present

## 2020-01-07 DIAGNOSIS — M542 Cervicalgia: Secondary | ICD-10-CM | POA: Diagnosis not present

## 2020-01-07 DIAGNOSIS — S46011A Strain of muscle(s) and tendon(s) of the rotator cuff of right shoulder, initial encounter: Secondary | ICD-10-CM | POA: Diagnosis not present

## 2020-01-19 DIAGNOSIS — Z23 Encounter for immunization: Secondary | ICD-10-CM | POA: Diagnosis not present

## 2020-02-08 DIAGNOSIS — D075 Carcinoma in situ of prostate: Secondary | ICD-10-CM | POA: Diagnosis not present

## 2020-02-08 DIAGNOSIS — R972 Elevated prostate specific antigen [PSA]: Secondary | ICD-10-CM | POA: Diagnosis not present

## 2020-02-08 DIAGNOSIS — C61 Malignant neoplasm of prostate: Secondary | ICD-10-CM | POA: Diagnosis not present

## 2020-02-15 DIAGNOSIS — C61 Malignant neoplasm of prostate: Secondary | ICD-10-CM

## 2020-02-15 HISTORY — DX: Malignant neoplasm of prostate: C61

## 2020-02-22 DIAGNOSIS — I700 Atherosclerosis of aorta: Secondary | ICD-10-CM | POA: Diagnosis not present

## 2020-02-22 DIAGNOSIS — C61 Malignant neoplasm of prostate: Secondary | ICD-10-CM | POA: Diagnosis not present

## 2020-02-25 ENCOUNTER — Other Ambulatory Visit: Payer: Self-pay | Admitting: Urology

## 2020-02-25 DIAGNOSIS — M542 Cervicalgia: Secondary | ICD-10-CM | POA: Diagnosis not present

## 2020-02-25 DIAGNOSIS — C61 Malignant neoplasm of prostate: Secondary | ICD-10-CM

## 2020-02-29 DIAGNOSIS — Z23 Encounter for immunization: Secondary | ICD-10-CM | POA: Diagnosis not present

## 2020-03-03 ENCOUNTER — Ambulatory Visit
Admission: RE | Admit: 2020-03-03 | Discharge: 2020-03-03 | Disposition: A | Discharge status: Home / Self Care | Payer: Self-pay | Source: Ambulatory Visit | Attending: Anatomic Pathology & Clinical Pathology | Admitting: Anatomic Pathology & Clinical Pathology

## 2020-03-03 ENCOUNTER — Other Ambulatory Visit (HOSPITAL_COMMUNITY): Payer: Self-pay | Admitting: Anatomic Pathology & Clinical Pathology

## 2020-03-03 DIAGNOSIS — R52 Pain, unspecified: Secondary | ICD-10-CM

## 2020-03-03 DIAGNOSIS — M5412 Radiculopathy, cervical region: Secondary | ICD-10-CM | POA: Diagnosis not present

## 2020-03-03 DIAGNOSIS — M545 Low back pain, unspecified: Secondary | ICD-10-CM | POA: Diagnosis not present

## 2020-03-04 ENCOUNTER — Encounter (HOSPITAL_COMMUNITY): Payer: 59

## 2020-03-04 ENCOUNTER — Other Ambulatory Visit (HOSPITAL_COMMUNITY): Payer: 59

## 2020-03-04 DIAGNOSIS — M542 Cervicalgia: Secondary | ICD-10-CM | POA: Diagnosis not present

## 2020-03-05 DIAGNOSIS — M5412 Radiculopathy, cervical region: Secondary | ICD-10-CM | POA: Diagnosis not present

## 2020-03-05 DIAGNOSIS — M545 Low back pain, unspecified: Secondary | ICD-10-CM | POA: Diagnosis not present

## 2020-03-07 ENCOUNTER — Other Ambulatory Visit: Payer: Self-pay

## 2020-03-07 ENCOUNTER — Encounter (HOSPITAL_COMMUNITY)
Admission: RE | Admit: 2020-03-07 | Discharge: 2020-03-07 | Disposition: A | Discharge status: Home / Self Care | Payer: 59 | Source: Ambulatory Visit | Attending: Urology | Admitting: Urology

## 2020-03-07 DIAGNOSIS — M170 Bilateral primary osteoarthritis of knee: Secondary | ICD-10-CM | POA: Diagnosis not present

## 2020-03-07 DIAGNOSIS — M160 Bilateral primary osteoarthritis of hip: Secondary | ICD-10-CM | POA: Diagnosis not present

## 2020-03-07 DIAGNOSIS — N3289 Other specified disorders of bladder: Secondary | ICD-10-CM | POA: Diagnosis not present

## 2020-03-07 DIAGNOSIS — C61 Malignant neoplasm of prostate: Secondary | ICD-10-CM | POA: Diagnosis not present

## 2020-03-07 MED ORDER — TECHNETIUM TC 99M MEDRONATE IV KIT: 20.0000 | PACK | Freq: Once | INTRAVENOUS | Status: DC | PRN

## 2020-03-10 DIAGNOSIS — N1831 Chronic kidney disease, stage 3a: Secondary | ICD-10-CM | POA: Diagnosis not present

## 2020-03-10 DIAGNOSIS — C61 Malignant neoplasm of prostate: Secondary | ICD-10-CM | POA: Diagnosis not present

## 2020-03-10 DIAGNOSIS — R972 Elevated prostate specific antigen [PSA]: Secondary | ICD-10-CM | POA: Diagnosis not present

## 2020-03-11 DIAGNOSIS — M5412 Radiculopathy, cervical region: Secondary | ICD-10-CM | POA: Diagnosis not present

## 2020-03-11 DIAGNOSIS — M545 Low back pain, unspecified: Secondary | ICD-10-CM | POA: Diagnosis not present

## 2020-03-12 DIAGNOSIS — C61 Malignant neoplasm of prostate: Secondary | ICD-10-CM | POA: Diagnosis not present

## 2020-03-12 NOTE — Progress Notes (Signed)
GU Location of Tumor / Histology: prostatic adenocarcinoma  If Prostate Cancer, Gleason Score is (4 + 3) and PSA is (2.71). Prostate volume: 25.84 g  [name] reports his PSA 2 years ago was around 2. PSA in April 2021 was 4.8. Patient stopped testosterone and repeat PSA was 2.71.  Biopsies of prostate (if applicable) revealed:   Past/Anticipated interventions by urology, if any: prostate biopsy, CT abd/prlvis (negative), bone scan, referral to Dr. Tammi Klippel to discuss radiotherapeutic options.  Past/Anticipated interventions by medical oncology, if any: no  Weight changes, if any: no  Bowel/Bladder complaints, if any: IPSS 28. SHIM 11. Denies dysuria or hematuria. Reports occasional post void dribble associated with incomplete emptying. Denies any bowel complaints.   Nausea/Vomiting, if any: denies  Pain issues, if any:  denies  SAFETY ISSUES:  Prior radiation? denies  Pacemaker/ICD? denies  Possible current pregnancy? no, male patient  Is the patient on methotrexate? denies  Current Complaints / other details:  74 year old male. Married. Resides in [place]. Hx of agent orange exposure.

## 2020-03-13 DIAGNOSIS — M545 Low back pain, unspecified: Secondary | ICD-10-CM | POA: Diagnosis not present

## 2020-03-13 DIAGNOSIS — M5412 Radiculopathy, cervical region: Secondary | ICD-10-CM | POA: Diagnosis not present

## 2020-03-14 ENCOUNTER — Other Ambulatory Visit: Payer: Self-pay

## 2020-03-14 ENCOUNTER — Encounter: Payer: Self-pay | Admitting: Medical Oncology

## 2020-03-14 ENCOUNTER — Encounter: Payer: Self-pay | Admitting: Radiation Oncology

## 2020-03-14 ENCOUNTER — Ambulatory Visit
Admission: RE | Admit: 2020-03-14 | Discharge: 2020-03-14 | Disposition: A | Discharge status: Home / Self Care | Payer: 59 | Source: Ambulatory Visit | Attending: Radiation Oncology | Admitting: Radiation Oncology

## 2020-03-14 VITALS — BP 123/64 | HR 71 | Temp 98.2°F | Resp 18 | Ht 65.0 in | Wt 171.8 lb

## 2020-03-14 DIAGNOSIS — G4730 Sleep apnea, unspecified: Secondary | ICD-10-CM | POA: Diagnosis not present

## 2020-03-14 DIAGNOSIS — C61 Malignant neoplasm of prostate: Secondary | ICD-10-CM | POA: Insufficient documentation

## 2020-03-14 DIAGNOSIS — R972 Elevated prostate specific antigen [PSA]: Secondary | ICD-10-CM | POA: Diagnosis not present

## 2020-03-14 DIAGNOSIS — Z803 Family history of malignant neoplasm of breast: Secondary | ICD-10-CM | POA: Insufficient documentation

## 2020-03-14 DIAGNOSIS — K219 Gastro-esophageal reflux disease without esophagitis: Secondary | ICD-10-CM | POA: Diagnosis not present

## 2020-03-14 DIAGNOSIS — M129 Arthropathy, unspecified: Secondary | ICD-10-CM | POA: Insufficient documentation

## 2020-03-14 DIAGNOSIS — Z87891 Personal history of nicotine dependence: Secondary | ICD-10-CM | POA: Diagnosis not present

## 2020-03-14 DIAGNOSIS — E039 Hypothyroidism, unspecified: Secondary | ICD-10-CM | POA: Insufficient documentation

## 2020-03-14 DIAGNOSIS — E119 Type 2 diabetes mellitus without complications: Secondary | ICD-10-CM | POA: Insufficient documentation

## 2020-03-14 DIAGNOSIS — Z85828 Personal history of other malignant neoplasm of skin: Secondary | ICD-10-CM | POA: Insufficient documentation

## 2020-03-14 DIAGNOSIS — E785 Hyperlipidemia, unspecified: Secondary | ICD-10-CM | POA: Diagnosis not present

## 2020-03-14 DIAGNOSIS — Z800 Family history of malignant neoplasm of digestive organs: Secondary | ICD-10-CM | POA: Diagnosis not present

## 2020-03-14 DIAGNOSIS — I10 Essential (primary) hypertension: Secondary | ICD-10-CM | POA: Diagnosis not present

## 2020-03-14 DIAGNOSIS — Z79899 Other long term (current) drug therapy: Secondary | ICD-10-CM | POA: Insufficient documentation

## 2020-03-14 DIAGNOSIS — Z7982 Long term (current) use of aspirin: Secondary | ICD-10-CM | POA: Diagnosis not present

## 2020-03-14 HISTORY — DX: Malignant neoplasm of prostate: C61

## 2020-03-14 HISTORY — DX: Unspecified malignant neoplasm of skin, unspecified: C44.90

## 2020-03-14 NOTE — Progress Notes (Signed)
Introduced myself to [name] and his wife, [name] as the prostate nurse navigator and discussed my role. He is here to discuss his radiation treatment options. He has an appointment with Dr. Alinda Money, 11/16 to discuss surgery. No barriers to care identified at this time. I provided them with my business card and asked them to reach out to me with questions or concerns. He voiced understanding.

## 2020-03-14 NOTE — Progress Notes (Signed)
Radiation Oncology         [id number]) [phone] ________________________________  Initial outpatient Consultation  Name: [name] MRN: [id number]  Date: 03/14/2020  DOB: 08/20/1945  CC:Altheimer, Legrand Como, MD  Franchot Gallo, MD   REFERRING PHYSICIAN: Franchot Gallo, MD  DIAGNOSIS: 74 y.o. gentleman with Stage T1c adenocarcinoma of the prostate with Gleason score of 4+3, and PSA of 2.71.    ICD-10-CM   1. Malignant neoplasm of prostate ([place])  C61     HISTORY OF PRESENT ILLNESS: [name] is a 74 y.o. male with a diagnosis of prostate cancer. He has been followed by urology for low testosterone, and he also has known Agent Orange exposure.  When his PSA rose to 4.8 in 08/2019, he discontinued the testosterone.  Digital rectal examination was performed at follow up on 11/14/2019 revealing right lobe firmness and a larger left lobe, but no discrete nodules.  He returned for repeat PSA in 11/2019 which had decreased to 2.71. The patient proceeded to transrectal ultrasound with 12 biopsies of the prostate on 02/08/2020.  The prostate volume measured 25.84 cc.  Out of 12 core biopsies, 9 were positive.  The maximum Gleason score was 4+3, and this was seen in the right apex lateral, right mid (with PNI), right base (with PNI), left mid lateral, and left apex lateral (with PNI). Additionally, Gleason 3+4 was seen in the left mid (with PNI), left apex, right apex (with PNI), and right mid lateral.  He underwent CT A/P on 02/22/2020 showing no evidence of metastatic disease or other significant abnormality. Bone scan on 03/07/2020 also showed no definite evidence of osseous metastatic disease.  He met with Dr. Rosana Hoes at [facility] on 03/10/2020 for a second opinion and is scheduled for consult with Dr. Alinda Money on 04/01/20 to discuss surgical options.  The patient reviewed the biopsy results with his urologist and he has kindly been referred today for discussion of potential radiation treatment  options.   PREVIOUS RADIATION THERAPY: No  PAST MEDICAL HISTORY:  Past Medical History:  Diagnosis Date  . Allergy   . Arthritis   . Diabetes mellitus ([place])   . GERD (gastroesophageal reflux disease)   . Gout   . HOH (hard of hearing)   . Hx of skin cancer, basal cell   . Hyperlipemia   . Hypertension   . Obstructive sleep apnea 11/07/2015  . Prostate cancer ([place])   . Renal disorder    mild renal insufficiency  . Rupture quadriceps tendon 09-21-13   left  . Seizures ([place]) 2000   only one time-never dx why-no meds  . Skin cancer   . Sleep apnea   . Thyroid disease    hypothyroidism  . Wears glasses       PAST SURGICAL HISTORY: Past Surgical History:  Procedure Laterality Date  . COLONOSCOPY    . [place]  . KNEE ARTHROSCOPY Left 09/26/2013   Procedure: LEFT KNEE ARTHROSCOPY WITH DEBRIDEMENT/SHAVING (CHONDROPLASTY), SUTURE REPAIR QUADRICEPS/HAMSTRING MUSCLE RUPTURE PRIMARY;  Surgeon: Lorn Junes, MD;  Location: [facility];  Service: Orthopedics;  Laterality: Left;  . KNEE SURGERY     left  . QUADRICEPS TENDON REPAIR Left 09/26/2013   Procedure: REPAIR QUADRICEP TENDON;  Surgeon: Lorn Junes, MD;  Location: [facility];  Service: Orthopedics;  Laterality: Left;  . TONSILLECTOMY      FAMILY HISTORY:  Family History  Problem Relation Age of Onset  . Hypertension Other   .  Diabetes Other   . Breast cancer Mother   . Breast cancer Sister   . Esophageal cancer Neg Hx   . Rectal cancer Neg Hx   . Stomach cancer Neg Hx   . Prostate cancer Neg Hx   . Pancreatic cancer Neg Hx   . 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 Social History   Socioeconomic History  . Marital status: Married    Spouse name: Shenouda Genova, RN   . Number of children: 2  . Years of education: Not on file  . Highest education level: Not on file  Occupational  History    Comment: pediatric and adult endocrinologist  Tobacco Use  . Smoking status: Former Smoker    Packs/day: 0.50    Years: 19.00    Pack years: 9.50    Types: Cigarettes    Quit date: 09/21/1972    Years since quitting: 47.5  . Smokeless tobacco: Never Used  Vaping Use  . Vaping Use: Never used  Substance and Sexual Activity  . Alcohol use: No    Alcohol/week: 14.0 standard drinks    Types: 7 Glasses of wine, 7 Shots of liquor per week    Comment: no longer drinks  . Drug use: No  . Sexual activity: Not Currently  Other Topics Concern  . Not on file  Social History Narrative  . Not on file   Social Determinants of Health   Financial Resource Strain:   . Difficulty of Paying Living Expenses: Not on file  Food Insecurity:   . Worried About Charity fundraiser in the Last Year: Not on file  . Ran Out of Food in the Last Year: Not on file  Transportation Needs:   . Lack of Transportation (Medical): Not on file  . Lack of Transportation (Non-Medical): Not on file  Physical Activity:   . Days of Exercise per Week: Not on file  . Minutes of Exercise per Session: Not on file  Stress:   . Feeling of Stress : Not on file  Social Connections:   . Frequency of Communication with Friends and Family: Not on file  . Frequency of Social Gatherings with Friends and Family: Not on file  . Attends Religious Services: Not on file  . Active Member of Clubs or Organizations: Not on file  . Attends Archivist Meetings: Not on file  . Marital Status: Not on file  Intimate Partner Violence:   . Fear of Current or Ex-Partner: Not on file  . Emotionally Abused: Not on file  . Physically Abused: Not on file  . Sexually Abused: Not on file    ALLERGIES: Lisinopril, Nsaids, and Ranitidine hcl  MEDICATIONS:  Current Outpatient Medications  Medication Sig Dispense Refill  . Accu-Chek FastClix Lancets MISC Apply topically.    Marland Kitchen acetaminophen (TYLENOL) 500 MG tablet Take  1,000 mg by mouth daily. For pain.    Marland Kitchen aspirin 81 MG tablet Take 81 mg by mouth daily.    Marland Kitchen atorvastatin (LIPITOR) 80 MG tablet Take 80 mg by mouth daily.    . Blood Glucose Monitoring Suppl (FIFTY50 GLUCOSE METER 2.0) w/Device KIT See admin instructions.    . calcium carbonate (TUMS EX) 750 MG chewable tablet Chew 1 tablet by mouth daily.    . Cholecalciferol 1.25 MG (50000 UT) TABS (Bio-tech D3-50) Take 1 tablet once a week    . ezetimibe (ZETIA) 10 MG  tablet Take 10 mg by mouth daily.    Marland Kitchen FORTESTA 10 MG/ACT (2%) GEL 3 application daily.    Marland Kitchen ketoconazole (NIZORAL) 2 % cream SMARTSIG:1 Topical Every Night    . levothyroxine (SYNTHROID, LEVOTHROID) 137 MCG tablet Take 137 mcg by mouth daily.    . minoxidil (ROGAINE) 2 % external solution Apply topically 2 (two) times daily.    . Multiple Vitamin (MULTIVITAMIN) tablet Take 1 tablet by mouth daily.    Marland Kitchen PROAIR HFA 108 (90 Base) MCG/ACT inhaler Inhale into the lungs.    . RABEprazole (ACIPHEX) 20 MG tablet Take 20 mg by mouth daily.    . simethicone (MYLICON) 505 MG chewable tablet Chew 125 mg by mouth every 6 (six) hours as needed for flatulence.    . sitaGLIPtan-metformin (JANUMET) 50-500 MG per tablet Take 2 tablets by mouth daily.     . tadalafil (CIALIS) 20 MG tablet     . telmisartan-hydrochlorothiazide (MICARDIS HCT) 40-12.5 MG per tablet Take 0.5 tablets by mouth daily.     Marland Kitchen UNIFINE PENTIPS 32G X 4 MM MISC     . VASCEPA 1 g capsule Take 2 g by mouth 2 (two) times daily.    Marland Kitchen VICTOZA 18 MG/3ML SOPN Inject 1.8 mg into the skin daily.    . Vitamin D, Ergocalciferol, (DRISDOL) 50000 UNITS CAPS Take 50,000 Units by mouth every 7 (seven) days. Taken on Sunday.    Marland Kitchen albuterol (VENTOLIN HFA) 108 (90 Base) MCG/ACT inhaler Inhale 2 sprays every 4 hours as needed for wheezing (Patient not taking: Reported on 03/14/2020)    . allopurinol (ZYLOPRIM) 300 MG tablet Take 300 mg by mouth daily. (Patient not taking: Reported on 03/14/2020)    .  azelastine (ASTELIN) 0.1 % nasal spray 1-2 puffs each nostril once or twice daily as needed (Patient not taking: Reported on 03/14/2020) 30 mL 12  . Choline Fenofibrate (TRILIPIX) 135 MG capsule Take 135 mg by mouth daily. (Patient not taking: Reported on 03/14/2020)    . FREESTYLE LITE test strip      Current Facility-Administered Medications  Medication Dose Route Frequency Provider Last Rate Last Admin  . 0.9 %  sodium chloride infusion  500 mL Intravenous Continuous Irene Shipper, MD        REVIEW OF SYSTEMS:  On review of systems, the patient reports that he is doing well overall. He denies any chest pain, shortness of breath, cough, fevers, chills, night sweats, unintended weight changes. He denies any bowel disturbances, and denies abdominal pain, nausea or vomiting. He denies any new musculoskeletal or joint aches or pains. His IPSS was 28, indicating severe urinary symptoms. His SHIM was 11, indicating he has moderate erectile dysfunction. A complete review of systems is obtained and is otherwise negative.    PHYSICAL EXAM:  Wt Readings from Last 3 Encounters:  03/14/20 171 lb 12.8 oz (77.9 kg)  05/08/19 150 lb (68 kg)  09/25/13 170 lb (77.1 kg)   Temp Readings from Last 3 Encounters:  03/14/20 98.2 F (36.8 C)  09/21/13 97.9 F (36.6 C) (Oral)   BP Readings from Last 3 Encounters:  03/14/20 123/64  05/08/19 120/60  09/25/13 117/67   Pulse Readings from Last 3 Encounters:  03/14/20 71  09/25/13 65  09/21/13 66   Pain Assessment Pain Score: 0-No pain/10  In general this is a well appearing Caucasian male in no acute distress. He is alert and oriented x4 and appropriate throughout the examination. HEENT reveals that the  patient is normocephalic, atraumatic. EOMs are intact. PERRLA. Skin is intact without any evidence of gross lesions. Cardiopulmonary assessment is negative for acute distress and he exhibits normal effort. The abdomen is soft, non tender, non distended.  Lower extremities are negative for pretibial pitting edema, deep calf tenderness, cyanosis or clubbing.  KPS = 100  100 - Normal; no complaints; no evidence of disease. 90   - Able to carry on normal activity; minor signs or symptoms of disease. 80   - Normal activity with effort; some signs or symptoms of disease. 28   - Cares for self; unable to carry on normal activity or to do active work. 60   - Requires occasional assistance, but is able to care for most of his personal needs. 50   - Requires considerable assistance and frequent medical care. 22   - Disabled; requires special care and assistance. 80   - Severely disabled; hospital admission is indicated although death not imminent. 28   - Very sick; hospital admission necessary; active supportive treatment necessary. 10   - Moribund; fatal processes progressing rapidly. 0     - Dead  Karnofsky DA, Abelmann [place], Craver LS and Burchenal [facility] [phone]) The use of the nitrogen mustards in the palliative treatment of carcinoma: with particular reference to bronchogenic carcinoma Cancer 1 634-56  LABORATORY DATA:  Lab Results  Component Value Date   WBC 9.3 06/01/2011   HGB 11.3 (L) 09/26/2013   HCT 46.6 06/01/2011   MCV 91.0 06/01/2011   PLT 263 06/01/2011   Lab Results  Component Value Date   NA 139 09/25/2013   K 4.2 09/25/2013   CL 99 09/25/2013   CO2 26 09/25/2013   Lab Results  Component Value Date   ALT 23 06/01/2011   AST 17 06/01/2011   ALKPHOS 42 06/01/2011   BILITOT 0.4 06/01/2011     RADIOGRAPHY: NM Bone Scan Whole Body  Result Date: 03/07/2020 CLINICAL DATA:  Prostate cancer, recent diagnosis, PSA 2.71 on 11/30/2019 EXAM: NUCLEAR MEDICINE WHOLE BODY BONE SCAN TECHNIQUE: Whole body anterior and posterior images were obtained approximately 3 hours after intravenous injection of radiopharmaceutical. RADIOPHARMACEUTICALS:  19 mCi Technetium-41mMDP IV COMPARISON:  None Radiographic correlation: CT abdomen and pelvis  02/22/2020 FINDINGS: Uptake at shoulders, sternoclavicular joints, knees, hips, typically degenerative. Minimal uptake in lumbar spine likely degenerative. Uptake at mid RIGHT foot which could be related to trauma or degenerative changes. Uptake at multiple RIGHT costovertebral junctions, likely degenerative. No definite sites of abnormal osseous uptake are seen to suggest osseous metastases. Significant tracer in a mildly distended bladder which could reflect outlet obstruction. Soft tissue distribution of tracer unremarkable. IMPRESSION: Scattered likely degenerative type uptake as above. No definite evidence of osseous metastatic disease. Electronically Signed   By: MLavonia DanaM.D.   On: 03/07/2020 17:52      IMPRESSION/PLAN: 1. 74y.o. gentleman with Stage T1c adenocarcinoma of the prostate with Gleason Score of 4+3, and PSA of 2.71. We discussed the patient's workup and outlined the nature of prostate cancer in this setting. The patient's T stage, Gleason's score, and PSA put him into the unfavorable intermediate risk group. Accordingly, he is eligible for a variety of potential treatment options including prostatectomy or ST-ADT in combination with either 5.5 weeks of external beam radiation or brachytherapy. He is not an ideal candidate for brachytherapy given his severe LUTS. We discussed the available radiation techniques, and focused on the details and logistics of delivery. We discussed and  outlined the risks, benefits, short and long-term effects associated with radiotherapy and compared and contrasted these with prostatectomy. We discussed the role of SpaceOAR gel in reducing the rectal toxicity associated with radiotherapy. We also detailed the role of ADT in the treatment of unfavorable intermediate risk prostate cancer and outlined the associated side effects that could be expected with this therapy.  He appears to have a good understanding of his disease and our treatment recommendations which  are of curative intent.  He and his wife were encouraged to ask questions that were answered to their stated satisfaction.  At the conclusion of our conversation, the patient is undecided and plans to reach a final decision following his upcoming consult visit with Dr. Alinda Money on 04/01/20. He has our contact information and will reach out with any further questions and/or should he elect to proced with radiotherapy so that we can move forward with treatment planning accordingly.  We enjoyed meeting him and his wife and look forward to continuing to participate in his care.    Nicholos Johns, PA-C    Tyler Pita, MD  [facility] Direct Dial: [phone]  Fax: [phone] [email or web address]  Skype  [email or web address]   This document serves as a record of services personally performed by Tyler Pita, MD and Freeman Caldron, PA-C. It was created on their behalf by Wilburn Mylar, a trained medical scribe. The creation of this record is based on the scribe's personal observations and the provider's statements to them. This document has been checked and approved by the attending provider.

## 2020-03-17 DIAGNOSIS — C61 Malignant neoplasm of prostate: Secondary | ICD-10-CM | POA: Insufficient documentation

## 2020-03-18 ENCOUNTER — Telehealth: Payer: Self-pay | Admitting: Radiation Oncology

## 2020-03-18 DIAGNOSIS — E0580 Other thyrotoxicosis without thyrotoxic crisis or storm: Secondary | ICD-10-CM | POA: Diagnosis not present

## 2020-03-18 DIAGNOSIS — G4733 Obstructive sleep apnea (adult) (pediatric): Secondary | ICD-10-CM | POA: Diagnosis not present

## 2020-03-18 DIAGNOSIS — I129 Hypertensive chronic kidney disease with stage 1 through stage 4 chronic kidney disease, or unspecified chronic kidney disease: Secondary | ICD-10-CM | POA: Diagnosis not present

## 2020-03-18 DIAGNOSIS — L299 Pruritus, unspecified: Secondary | ICD-10-CM | POA: Diagnosis not present

## 2020-03-18 DIAGNOSIS — N1831 Chronic kidney disease, stage 3a: Secondary | ICD-10-CM | POA: Diagnosis not present

## 2020-03-18 DIAGNOSIS — E559 Vitamin D deficiency, unspecified: Secondary | ICD-10-CM | POA: Diagnosis not present

## 2020-03-18 DIAGNOSIS — E790 Hyperuricemia without signs of inflammatory arthritis and tophaceous disease: Secondary | ICD-10-CM | POA: Diagnosis not present

## 2020-03-18 DIAGNOSIS — E039 Hypothyroidism, unspecified: Secondary | ICD-10-CM | POA: Diagnosis not present

## 2020-03-18 DIAGNOSIS — E1122 Type 2 diabetes mellitus with diabetic chronic kidney disease: Secondary | ICD-10-CM | POA: Diagnosis not present

## 2020-03-18 DIAGNOSIS — E782 Mixed hyperlipidemia: Secondary | ICD-10-CM | POA: Diagnosis not present

## 2020-03-18 DIAGNOSIS — E230 Hypopituitarism: Secondary | ICD-10-CM | POA: Diagnosis not present

## 2020-03-18 DIAGNOSIS — C61 Malignant neoplasm of prostate: Secondary | ICD-10-CM | POA: Diagnosis not present

## 2020-03-18 NOTE — Telephone Encounter (Signed)
Received voicemail message from patient requesting a return call. Phoned patient back to inquire. Patient questions if his wife may accompany him to his consultation appointment tomorrow. Assured him she could. Patient has no additional questions and verbalized appreciation for the call back.

## 2020-03-25 DIAGNOSIS — M5412 Radiculopathy, cervical region: Secondary | ICD-10-CM | POA: Diagnosis not present

## 2020-03-25 DIAGNOSIS — M545 Low back pain, unspecified: Secondary | ICD-10-CM | POA: Diagnosis not present

## 2020-04-01 DIAGNOSIS — C61 Malignant neoplasm of prostate: Secondary | ICD-10-CM | POA: Diagnosis not present

## 2020-04-09 ENCOUNTER — Other Ambulatory Visit: Payer: Self-pay | Admitting: Urology

## 2020-04-09 DIAGNOSIS — G4733 Obstructive sleep apnea (adult) (pediatric): Secondary | ICD-10-CM | POA: Diagnosis not present

## 2020-04-09 DIAGNOSIS — L299 Pruritus, unspecified: Secondary | ICD-10-CM | POA: Diagnosis not present

## 2020-04-09 DIAGNOSIS — E782 Mixed hyperlipidemia: Secondary | ICD-10-CM | POA: Diagnosis not present

## 2020-04-09 DIAGNOSIS — E790 Hyperuricemia without signs of inflammatory arthritis and tophaceous disease: Secondary | ICD-10-CM | POA: Diagnosis not present

## 2020-04-09 DIAGNOSIS — I129 Hypertensive chronic kidney disease with stage 1 through stage 4 chronic kidney disease, or unspecified chronic kidney disease: Secondary | ICD-10-CM | POA: Diagnosis not present

## 2020-04-09 DIAGNOSIS — N1831 Chronic kidney disease, stage 3a: Secondary | ICD-10-CM | POA: Diagnosis not present

## 2020-04-09 DIAGNOSIS — E063 Autoimmune thyroiditis: Secondary | ICD-10-CM | POA: Diagnosis not present

## 2020-04-09 DIAGNOSIS — C61 Malignant neoplasm of prostate: Secondary | ICD-10-CM | POA: Diagnosis not present

## 2020-04-09 DIAGNOSIS — E559 Vitamin D deficiency, unspecified: Secondary | ICD-10-CM | POA: Diagnosis not present

## 2020-04-09 DIAGNOSIS — E1122 Type 2 diabetes mellitus with diabetic chronic kidney disease: Secondary | ICD-10-CM | POA: Diagnosis not present

## 2020-04-09 DIAGNOSIS — E038 Other specified hypothyroidism: Secondary | ICD-10-CM | POA: Diagnosis not present

## 2020-04-09 DIAGNOSIS — E230 Hypopituitarism: Secondary | ICD-10-CM | POA: Diagnosis not present

## 2020-04-14 ENCOUNTER — Encounter: Payer: Self-pay | Admitting: Medical Oncology

## 2020-04-15 DIAGNOSIS — N183 Chronic kidney disease, stage 3 unspecified: Secondary | ICD-10-CM | POA: Diagnosis not present

## 2020-04-15 DIAGNOSIS — E559 Vitamin D deficiency, unspecified: Secondary | ICD-10-CM | POA: Diagnosis not present

## 2020-04-15 DIAGNOSIS — E038 Other specified hypothyroidism: Secondary | ICD-10-CM | POA: Diagnosis not present

## 2020-04-15 DIAGNOSIS — E7800 Pure hypercholesterolemia, unspecified: Secondary | ICD-10-CM | POA: Diagnosis not present

## 2020-04-15 DIAGNOSIS — E119 Type 2 diabetes mellitus without complications: Secondary | ICD-10-CM | POA: Diagnosis not present

## 2020-04-15 DIAGNOSIS — E230 Hypopituitarism: Secondary | ICD-10-CM | POA: Diagnosis not present

## 2020-04-15 DIAGNOSIS — I10 Essential (primary) hypertension: Secondary | ICD-10-CM | POA: Diagnosis not present

## 2020-04-15 DIAGNOSIS — E782 Mixed hyperlipidemia: Secondary | ICD-10-CM | POA: Diagnosis not present

## 2020-04-15 DIAGNOSIS — C61 Malignant neoplasm of prostate: Secondary | ICD-10-CM | POA: Diagnosis not present

## 2020-04-18 DIAGNOSIS — M62838 Other muscle spasm: Secondary | ICD-10-CM | POA: Diagnosis not present

## 2020-04-18 DIAGNOSIS — L821 Other seborrheic keratosis: Secondary | ICD-10-CM | POA: Diagnosis not present

## 2020-04-18 DIAGNOSIS — D1801 Hemangioma of skin and subcutaneous tissue: Secondary | ICD-10-CM | POA: Diagnosis not present

## 2020-04-18 DIAGNOSIS — N3941 Urge incontinence: Secondary | ICD-10-CM | POA: Diagnosis not present

## 2020-04-18 DIAGNOSIS — Z85828 Personal history of other malignant neoplasm of skin: Secondary | ICD-10-CM | POA: Diagnosis not present

## 2020-04-18 DIAGNOSIS — M545 Low back pain, unspecified: Secondary | ICD-10-CM | POA: Diagnosis not present

## 2020-04-18 DIAGNOSIS — L309 Dermatitis, unspecified: Secondary | ICD-10-CM | POA: Diagnosis not present

## 2020-04-18 DIAGNOSIS — M6281 Muscle weakness (generalized): Secondary | ICD-10-CM | POA: Diagnosis not present

## 2020-04-18 DIAGNOSIS — M6289 Other specified disorders of muscle: Secondary | ICD-10-CM | POA: Diagnosis not present

## 2020-04-18 DIAGNOSIS — L853 Xerosis cutis: Secondary | ICD-10-CM | POA: Diagnosis not present

## 2020-04-25 NOTE — Progress Notes (Addendum)
COVID Vaccine Completed:  x2 Date COVID Vaccine completed:  September 2021 Booster COVID vaccine manufacturer: [place]   PCP - Lorne Skeens, MD Cardiologist -   Chest x-ray -  EKG - 04-28-20 in Epic Stress Test -  ECHO -  Cardiac Cath -  Pacemaker/ICD device last checked:  Sleep Study - 11-23-15 in Epic, +sleep apnea CPAP - Yes  Fasting Blood Sugar - 90-120 Checks Blood Sugar -  Freestyle Libre, checks occasionally  Blood Thinner Instructions: Aspirin Instructions: Last Dose:  Hx of one seizure in 2000  Anesthesia review:   Patient denies shortness of breath, fever, cough and chest pain at PAT appointment.  Pt able to climb a flight of stairs and perform ADLs without assistance.   Patient verbalized understanding of instructions that were given to them at the PAT appointment. Patient was also instructed that they will need to review over the PAT instructions again at home before surgery.

## 2020-04-25 NOTE — Patient Instructions (Addendum)
DUE TO COVID-19 ONLY ONE VISITOR IS ALLOWED TO COME WITH YOU AND STAY IN THE WAITING ROOM ONLY DURING PRE OP AND PROCEDURE.   IF YOU WILL BE ADMITTED INTO THE HOSPITAL YOU ARE ALLOWED ONE SUPPORT PERSON DURING VISITATION HOURS ONLY (10AM -8PM)   . The support person may change daily. . The support person must pass our screening, gel in and out, and wear a mask at all times, including in the patient's room. . Patients must also wear a mask when staff or their support person are in the room.   COVID SWAB TESTING MUST BE COMPLETED ON:   Thursday, 05-01-20 @ 3:00 PM   [address] [place], [place] [address]  (Must self quarantine after testing. Follow instructions on handout.)     Your procedure is scheduled on:  Monday, 05-05-20   Report to [facility] Main  Entrance   Report to Short Stay at 5:30 AM   [facility])   Call this number if you have problems the morning of surgery [phone]    Prep:  Drink 8 oz of Magnesium Citrate at noon the day before surgery.  Use a Fleets enema the night before surgery   Do not eat food :After Midnight.   May have liquids until 4:15 AM day of surgery  CLEAR LIQUID DIET  Foods Allowed                                                                     Foods Excluded  Water, Black Coffee and tea, regular and decaf              liquids that you cannot  Plain Jell-O in any flavor  (No red)                                     see through such as: Fruit ices (not with fruit pulp)                                      milk, soups, orange juice              Iced Popsicles (No red)                                      All solid food                                   Apple juices Sports drinks like Gatorade (No red) Lightly seasoned clear broth or consume(fat free) Sugar, honey syrup   Oral Hygiene is also important to reduce your risk of infection.                                    Remember - BRUSH YOUR TEETH THE MORNING OF SURGERY WITH  YOUR REGULAR TOOTHPASTE   Do NOT smoke  after Midnight   Take these medicines the morning of surgery with A SIP OF WATER:  Atorvastatin, Zetia, Synthroid, Aciphex   How to Manage Your Diabetes Before and After Surgery  Why is it important to control my blood sugar before and after surgery? . Improving blood sugar levels before and after surgery helps healing and can limit problems. . A way of improving blood sugar control is eating a healthy diet by: o  Eating less sugar and carbohydrates o  Increasing activity/exercise o  Talking with your doctor about reaching your blood sugar goals . High blood sugars (greater than 180 mg/dL) can raise your risk of infections and slow your recovery, so you will need to focus on controlling your diabetes during the weeks before surgery. . Make sure that the doctor who takes care of your diabetes knows about your planned surgery including the date and location.  How do I manage my blood sugar before surgery? . Check your blood sugar at least 4 times a day, starting 2 days before surgery, to make sure that the level is not too high or low. o Check your blood sugar the morning of your surgery when you wake up and every 2 hours until you get to the Short Stay unit. . If your blood sugar is less than 70 mg/dL, you will need to treat for low blood sugar: o Do not take insulin. o Treat a low blood sugar (less than 70 mg/dL) with  cup of clear juice (cranberry or apple), 4 glucose tablets, OR glucose gel. o Recheck blood sugar in 15 minutes after treatment (to make sure it is greater than 70 mg/dL). If your blood sugar is not greater than 70 mg/dL on recheck, call [phone] for further instructions. . Report your blood sugar to the short stay nurse when you get to Short Stay.  . If you are admitted to the hospital after surgery: o Your blood sugar will be checked by the staff and you will probably be given insulin after surgery (instead of oral diabetes  medicines) to make sure you have good blood sugar levels. o The goal for blood sugar control after surgery is 80-180 mg/dL.   WHAT DO I DO ABOUT MY DIABETES MEDICATION?  Marland Kitchen Do not take oral diabetes medicines (pills) the morning of surgery.  . THE DAY BEFORE SURGERY:   Take Janumet and Victoza as prescribed.       . THE MORNING OF SURGERY: Do not take Janumet or Victoza.  . The day of surgery, do not take other diabetes injectables, including Byetta (exenatide), Bydureon (exenatide ER), Victoza (liraglutide), or Trulicity (dulaglutide).   Reviewed and Endorsed by [facility] Patient Education Committee, August 2015                               You may not have any metal on your body including jewelry, and body piercings             Do not wear lotions, powders, perfumes/cologne, or deodorant             Men may shave face and neck.   Do not bring valuables to the hospital. [place].   Contacts, dentures or bridgework may not be worn into surgery.   Bring small overnight bag day of surgery.    Bring CPAP mask and tubing  Please read over the following fact sheets you were given: IF YOU HAVE QUESTIONS ABOUT YOUR PRE OP INSTRUCTIONS PLEASE CALL [phone]   [place] - Preparing for Surgery Before surgery, you can play an important role.  Because skin is not sterile, your skin needs to be as free of germs as possible.  You can reduce the number of germs on your skin by washing with CHG (chlorahexidine gluconate) soap before surgery.  CHG is an antiseptic cleaner which kills germs and bonds with the skin to continue killing germs even after washing. Please DO NOT use if you have an allergy to CHG or antibacterial soaps.  If your skin becomes reddened/irritated stop using the CHG and inform your nurse when you arrive at Short Stay. Do not shave (including legs and underarms) for at least 48 hours prior to the first CHG shower.  You  may shave your face/neck.  Please follow these instructions carefully:  1.  Shower with CHG Soap the night before surgery and the  morning of surgery.  2.  If you choose to wash your hair, wash your hair first as usual with your normal  shampoo.  3.  After you shampoo, rinse your hair and body thoroughly to remove the shampoo.                             4.  Use CHG as you would any other liquid soap.  You can apply chg directly to the skin and wash.  Gently with a scrungie or clean washcloth.  5.  Apply the CHG Soap to your body ONLY FROM THE NECK DOWN.   Do   not use on face/ open                           Wound or open sores. Avoid contact with eyes, ears mouth and   genitals (private parts).                       Wash face,  Genitals (private parts) with your normal soap.             6.  Wash thoroughly, paying special attention to the area where your    surgery  will be performed.  7.  Thoroughly rinse your body with warm water from the neck down.  8.  DO NOT shower/wash with your normal soap after using and rinsing off the CHG Soap.                9.  Pat yourself dry with a clean towel.            10.  Wear clean pajamas.            11.  Place clean sheets on your bed the night of your first shower and do not  sleep with pets. Day of Surgery : Do not apply any lotions/deodorants the morning of surgery.  Please wear clean clothes to the hospital/surgery center.  FAILURE TO FOLLOW THESE INSTRUCTIONS MAY RESULT IN THE CANCELLATION OF YOUR SURGERY  PATIENT SIGNATURE_________________________________  NURSE SIGNATURE__________________________________  ________________________________________________________________________   Adam Phenix  An incentive spirometer is a tool that can help keep your lungs clear and active. This tool measures how well you are filling your lungs with each breath. Taking long deep breaths may help reverse or decrease the chance of developing breathing  (  pulmonary) problems (especially infection) following:  A long period of time when you are unable to move or be active. BEFORE THE PROCEDURE   If the spirometer includes an indicator to show your best effort, your nurse or respiratory therapist will set it to a desired goal.  If possible, sit up straight or lean slightly forward. Try not to slouch.  Hold the incentive spirometer in an upright position. INSTRUCTIONS FOR USE  1. Sit on the edge of your bed if possible, or sit up as far as you can in bed or on a chair. 2. Hold the incentive spirometer in an upright position. 3. Breathe out normally. 4. Place the mouthpiece in your mouth and seal your lips tightly around it. 5. Breathe in slowly and as deeply as possible, raising the piston or the ball toward the top of the column. 6. Hold your breath for 3-5 seconds or for as long as possible. Allow the piston or ball to fall to the bottom of the column. 7. Remove the mouthpiece from your mouth and breathe out normally. 8. Rest for a few seconds and repeat Steps 1 through 7 at least 10 times every 1-2 hours when you are awake. Take your time and take a few normal breaths between deep breaths. 9. The spirometer may include an indicator to show your best effort. Use the indicator as a goal to work toward during each repetition. 10. After each set of 10 deep breaths, practice coughing to be sure your lungs are clear. If you have an incision (the cut made at the time of surgery), support your incision when coughing by placing a pillow or rolled up towels firmly against it. Once you are able to get out of bed, walk around indoors and cough well. You may stop using the incentive spirometer when instructed by your caregiver.  RISKS AND COMPLICATIONS  Take your time so you do not get dizzy or light-headed.  If you are in pain, you may need to take or ask for pain medication before doing incentive spirometry. It is harder to take a deep breath if you  are having pain. AFTER USE  Rest and breathe slowly and easily.  It can be helpful to keep track of a log of your progress. Your caregiver can provide you with a simple table to help with this. If you are using the spirometer at home, follow these instructions: [place] IF:   You are having difficultly using the spirometer.  You have trouble using the spirometer as often as instructed.  Your pain medication is not giving enough relief while using the spirometer.  You develop fever of 100.5 F (38.1 C) or higher. SEEK IMMEDIATE MEDICAL CARE IF:   You cough up bloody sputum that had not been present before.  You develop fever of 102 F (38.9 C) or greater.  You develop worsening pain at or near the incision site. MAKE SURE YOU:   Understand these instructions.  Will watch your condition.  Will get help right away if you are not doing well or get worse. Document Released: 09/13/2006 Document Revised: 07/26/2011 Document Reviewed: 11/14/2006 [employer] Patient Information 2014 [employer], [place].   ________________________________________________________________________  WHAT IS A BLOOD TRANSFUSION? Blood Transfusion Information  A transfusion is the replacement of blood or some of its parts. Blood is made up of multiple cells which provide different functions.  Red blood cells carry oxygen and are used for blood loss replacement.  White blood cells fight against infection.  Platelets control bleeding.  Plasma helps clot blood.  Other blood products are available for specialized needs, such as hemophilia or other clotting disorders. BEFORE THE TRANSFUSION  Who gives blood for transfusions?   Healthy volunteers who are fully evaluated to make sure their blood is safe. This is blood bank blood. Transfusion therapy is the safest it has ever been in the practice of medicine. Before blood is taken from a donor, a complete history is taken to make sure that person has  no history of diseases nor engages in risky social behavior (examples are intravenous drug use or sexual activity with multiple partners). The donor's travel history is screened to minimize risk of transmitting infections, such as malaria. The donated blood is tested for signs of infectious diseases, such as HIV and hepatitis. The blood is then tested to be sure it is compatible with you in order to minimize the chance of a transfusion reaction. If you or a relative donates blood, this is often done in anticipation of surgery and is not appropriate for emergency situations. It takes many days to process the donated blood. RISKS AND COMPLICATIONS Although transfusion therapy is very safe and saves many lives, the main dangers of transfusion include:   Getting an infectious disease.  Developing a transfusion reaction. This is an allergic reaction to something in the blood you were given. Every precaution is taken to prevent this. The decision to have a blood transfusion has been considered carefully by your caregiver before blood is given. Blood is not given unless the benefits outweigh the risks. AFTER THE TRANSFUSION  Right after receiving a blood transfusion, you will usually feel much better and more energetic. This is especially true if your red blood cells have gotten low (anemic). The transfusion raises the level of the red blood cells which carry oxygen, and this usually causes an energy increase.  The nurse administering the transfusion will monitor you carefully for complications. HOME CARE INSTRUCTIONS  No special instructions are needed after a transfusion. You may find your energy is better. Speak with your caregiver about any limitations on activity for underlying diseases you may have. SEEK MEDICAL CARE IF:   Your condition is not improving after your transfusion.  You develop redness or irritation at the intravenous (IV) site. SEEK IMMEDIATE MEDICAL CARE IF:  Any of the following  symptoms occur over the next 12 hours:  Shaking chills.  You have a temperature by mouth above 102 F (38.9 C), not controlled by medicine.  Chest, back, or muscle pain.  People around you feel you are not acting correctly or are confused.  Shortness of breath or difficulty breathing.  Dizziness and fainting.  You get a rash or develop hives.  You have a decrease in urine output.  Your urine turns a dark color or changes to pink, red, or brown. Any of the following symptoms occur over the next 10 days:  You have a temperature by mouth above 102 F (38.9 C), not controlled by medicine.  Shortness of breath.  Weakness after normal activity.  The white part of the eye turns yellow (jaundice).  You have a decrease in the amount of urine or are urinating less often.  Your urine turns a dark color or changes to pink, red, or brown. Document Released: 04/30/2000 Document Revised: 07/26/2011 Document Reviewed: 12/18/2007 [facility] Patient Information 2014 [place], [place].  _______________________________________________________________________

## 2020-04-28 ENCOUNTER — Other Ambulatory Visit: Payer: Self-pay

## 2020-04-28 ENCOUNTER — Encounter (HOSPITAL_COMMUNITY)
Admission: RE | Admit: 2020-04-28 | Discharge: 2020-04-28 | Disposition: A | Discharge status: Home / Self Care | Payer: 59 | Source: Ambulatory Visit | Attending: Urology | Admitting: Urology

## 2020-04-28 ENCOUNTER — Encounter (HOSPITAL_COMMUNITY): Payer: Self-pay

## 2020-04-28 DIAGNOSIS — I10 Essential (primary) hypertension: Secondary | ICD-10-CM | POA: Insufficient documentation

## 2020-04-28 DIAGNOSIS — Z01818 Encounter for other preprocedural examination: Secondary | ICD-10-CM | POA: Insufficient documentation

## 2020-04-28 HISTORY — DX: Pneumonia, unspecified organism: J18.9

## 2020-04-28 HISTORY — DX: Hypothyroidism, unspecified: E03.9

## 2020-04-28 LAB — BASIC METABOLIC PANEL
Anion gap: 11 (ref 5–15)
BUN: 35 mg/dL — ABNORMAL HIGH (ref 8–23)
CO2: 22 mmol/L (ref 22–32)
Calcium: 9 mg/dL (ref 8.9–10.3)
Chloride: 107 mmol/L (ref 98–111)
Creatinine, Ser: 1.39 mg/dL — ABNORMAL HIGH (ref 0.61–1.24)
GFR, Estimated: 53 mL/min — ABNORMAL LOW (ref >60)
Glucose, Bld: 107 mg/dL — ABNORMAL HIGH (ref 70–99)
Potassium: 3.7 mmol/L (ref 3.5–5.1)
Sodium: 140 mmol/L (ref 135–145)

## 2020-04-28 LAB — GLUCOSE, CAPILLARY: Glucose-Capillary: 113 mg/dL — ABNORMAL HIGH (ref 70–99)

## 2020-04-28 LAB — HEMOGLOBIN A1C
Hgb A1c MFr Bld: 6.3 % — ABNORMAL HIGH (ref 4.8–5.6)
Mean Plasma Glucose: 134.11 mg/dL

## 2020-04-28 LAB — CBC
HCT: 39.3 % (ref 39.0–52.0)
Hemoglobin: 13.3 g/dL (ref 13.0–17.0)
MCH: 31.8 pg (ref 26.0–34.0)
MCHC: 33.8 g/dL (ref 30.0–36.0)
MCV: 94 fL (ref 80.0–100.0)
Platelets: 206 10*3/uL (ref 150–400)
RBC: 4.18 MIL/uL — ABNORMAL LOW (ref 4.22–5.81)
RDW: 13.2 % (ref 11.5–15.5)
WBC: 6.5 10*3/uL (ref 4.0–10.5)
nRBC: 0 % (ref 0.0–0.2)

## 2020-05-01 ENCOUNTER — Other Ambulatory Visit (HOSPITAL_COMMUNITY)
Admission: RE | Admit: 2020-05-01 | Discharge: 2020-05-01 | Disposition: A | Discharge status: Home / Self Care | Payer: 59 | Source: Ambulatory Visit | Attending: Urology | Admitting: Urology

## 2020-05-01 DIAGNOSIS — Z01812 Encounter for preprocedural laboratory examination: Secondary | ICD-10-CM | POA: Diagnosis not present

## 2020-05-01 DIAGNOSIS — Z20822 Contact with and (suspected) exposure to covid-19: Secondary | ICD-10-CM | POA: Diagnosis not present

## 2020-05-01 LAB — SARS CORONAVIRUS 2 (TAT 6-24 HRS): SARS Coronavirus 2: NEGATIVE

## 2020-05-02 NOTE — H&P (Signed)
CC: Prostate Cancer   Physician requesting consult: Dr. 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 Elyse Hsu. He was noted to have a PSA increase to 4.8 in April 2021 causing him to stop [place]. He was seen by Dr. Diona Fanti in June 2021 and his PSA was repeated and was 2.71. His DRE was reportedly unremarkable except for probable BPH. He did undergo a TRUS biopsy of the prostate on 02/08/20 that demonstrated Gleason 4+3=7 adenocarcinoma with 9 out of 12 biopsy cores positive for malignancy. He underwent staging imaging with a CT scan and bone scan that were negative for metastatic disease. He has been counseled by Dr. Diona Fanti, Dr. Lawerance Bach [facility]), and Dr. Tammi Klippel (radiation oncology).   Family history: No prostate cancer. Sister and mother with breast cancer.   Imaging studies:  CT abdomen/pelvis (02/08/20): Negative for metastatic disease.  Bone scan (02/08/20): Negative for metastatic disease.   PMH: He has a history of CKD (Cr 1.5), diabetes, hypertension, hyperlipidemia, hypothyroidism, sleep apnea, GERD, h/o seizure disorder (distant with no clear etiology and has not been recurrent).  PSH: No abdominal surgeries.   TNM stage: cT1c N0 M0  PSA: 2.71  Gleason score: 4+3=7  Biopsy (02/08/20): 9/12 cores positive  Left: L lateral apex (13%, 4+3=7), L apex (16%, 3+4=7), L lateral mid (19%, 4+3=7), L mid (53%, 3+4=7, PNI)  Right: R apex (13%, 3+4=7, PNI), R lateral apex (7%, 4+3=7), R mid (56%, 4+3=7, PNI), R lateral mid (10%, 3+4=7), R base (29%, 4+3=7, PNI)  Prostate volume: 25.8 cc   Nomogram  OC disease: 33%  EPE: 64%  SVI: 15%  LNI: 12%  PFS (5 year, 10 year): 71%, 57%   Urinary function: IPSS is 23.  Erectile function: He does have significant baseline erectile dysfunction that has not been responsive to oral medical therapy.     ALLERGIES: Lisinopril TABS Zantac TABS     MEDICATIONS: Cialis 5 mg tablet 1 tablet PO Daily  Cipro 500 mg tablet  Fortesta 10 mg/0.5 gram per actuation (2 %) gel in metered-dose pump  Levofloxacin 750 mg tablet 1 tablet PO Morning of procedure  Phenazopyridine Hcl 200 mg tablet  Albuterol Sulfate Hfa 90 mcg hfa aerosol with adapter  Allopurinol 300 MG Oral Tablet Oral  Aspirin 81 MG TABS Oral  Atorvastatin Calcium 80 MG Oral Tablet Oral  Azelastine Hcl 137 mcg (0.1 %) aerosol, spray with pump  Benzonatate 100 mg capsule  Janumet XR 50-500 MG Oral Tablet Extended Release 24 Hour Oral  Loratadine 10 mg tablet  Meloxicam 7.5 mg tablet  Micardis HCT 40-12.5 MG Oral Tablet Oral  Rabeprazole Sodium 20 mg tablet, delayed release  Rogaine SOLN External  Synthroid 137 mcg tablet  Tadalafil 20 mg tablet  Trazodone Hcl 50 mg tablet tablet  Tylenol Arthritis 650 mg tablet, extended release  Vascepa 1 gram capsule  Victoza 2-Pak  Vitamin D CAPS Oral  Zetia 10 MG Oral Tablet Oral     GU PSH: Locm 300-399Mg /Ml Iodine,1Ml - 02/22/2020 Prostate Needle Biopsy - 02/08/2020 Vasectomy - 1978       PSH Notes: Arthroscopy Knee Left, Facial Surgery   NON-GU PSH: Knee Arthroscopy; Dx - 2013 Surgical Pathology, Gross And Microscopic Examination For Prostate Needle - 02/08/2020     GU PMH: Prostate Cancer, 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 I think that is a bit too high. 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 After discussing the options I will put him on a short course of meloxicam. He didn't want to use steroids because of  the diabetes. If he doesn't improve with the meloxicam, we may need to consider a cord block or possibly an orchiectomy since this is a recurring problem. He will return as needed. - 2018, Epididymitis, - 2014 Kidney Failure Unspec, Renal Failure - 2014 Prostate Disorder, Unspec, Prostate disorder - 2014      PMH Notes:  2011-07-07 15:17:05 - Note: Seizure   NON-GU PMH: Gastric ulcer, unspecified as acute or chronic, without hemorrhage or perforation, Gastric Ulcer - 2014 Hypogonadotropic hypogonadism, Hypogonadotrophic Hypogonadism - 2014 Personal history of other diseases of the circulatory system, History of hypertension - 2014 Personal history of other diseases of the digestive system, History of esophageal reflux - 2014 Personal history of other diseases of the nervous system and sense organs, History of sleep apnea - 2014 Personal history of other endocrine, nutritional and metabolic disease, History of hypercholesterolemia - 2014, History of hypothyroidism, - 2014, History of type 2 diabetes mellitus, - 2014 Arthritis Encounter for general adult medical examination without abnormal findings, Encounter for preventive health examination GERD Gout Hypercholesterolemia Hypertension Hypothyroidism Neoplasm of unspecified behavior of bone, soft tissue, and skin Seizure disorder Sleep Apnea Type 1 diabetes mellitus without complications    FAMILY HISTORY: 2 sons - Other Atherosclerosis - Father, Mother Breast Cancer - Sister, Mother Death In The Family Father - Runs In Family Death In The Family Mother - Runs In Family Diabetes - Uncle, [place] Status Number - Runs In Family Gout - Grandmother Heart Attack - Other Hypertension - Father Kidney Failure - Runs in Family renal failure - Father   SOCIAL HISTORY: Marital Status: Married Preferred Language: English; Ethnicity: Not Hispanic Or Latino; Race: White Current Smoking Status: Patient does not smoke anymore.    Tobacco Use Assessment Completed: Used Tobacco in last 30 days? Does not use smokeless tobacco. Has never drank.  Does not use drugs. Drinks 4+ caffeinated drinks per day.     Notes: Former smoker, Occupation:, Marital History - Currently Married, Caffeine Use, Alcohol Use   REVIEW OF SYSTEMS:    GU Review Male:   Patient denies frequent urination, hard to postpone urination, burning/ pain with urination, get up at night to urinate, leakage of urine, stream starts and stops, trouble starting your streams, and have to strain to urinate .  Gastrointestinal (Upper):   Patient denies nausea and vomiting.  Gastrointestinal (Lower):   Patient denies diarrhea and constipation.  Constitutional:   Patient denies fever, night sweats, weight loss, and fatigue.  Skin:   Patient denies skin rash/ lesion and itching.  Eyes:   Patient denies blurred vision and double vision.  Ears/ Nose/ Throat:   Patient denies sore throat and sinus problems.  Hematologic/Lymphatic:   Patient denies swollen glands and easy bruising.  Cardiovascular:   Patient denies leg swelling and chest pains.  Respiratory:   Patient denies cough and shortness of breath.  Endocrine:   Patient denies excessive thirst.  Musculoskeletal:   Patient denies back pain and joint pain.  Neurological:  Patient denies headaches and dizziness.  Psychologic:   Patient denies depression and anxiety.   VITAL SIGNS:      Weight 166 lb / 75.3 kg  Height 65 in / 165.1 cm  BMI 27.6 kg/m     MULTI-SYSTEM PHYSICAL EXAMINATION:    Constitutional: Well-nourished. No physical deformities. Normally developed. Good grooming.  Neck: Neck symmetrical, not swollen. Normal tracheal position.  Respiratory: No labored breathing, no use of accessory muscles. Clear bilaterally.  Cardiovascular: Normal temperature, normal extremity pulses, no swelling, no varicosities. Regular rate and rhythm.  Lymphatic: No enlargement of neck, axillae, groin.  Skin:  No paleness, no jaundice, no cyanosis. No lesion, no ulcer, no rash.  Neurologic / Psychiatric: Oriented to time, oriented to place, oriented to person. No depression, no anxiety, no agitation.  Gastrointestinal: No mass, no tenderness, no rigidity, non obese abdomen. Small reducible umbilical hernia.  Eyes: Normal conjunctivae. Normal eyelids.  Ears, Nose, Mouth, and Throat: Left ear no scars, no lesions, no masses. Right ear no scars, no lesions, no masses. Nose no scars, no lesions, no masses. Normal hearing. Normal lips.  Musculoskeletal: Normal gait and station of head and neck.    ASSESSMENT:      ICD-10 Details  1 GU:   Prostate Cancer - C61    PLAN:      1. Unfavorable intermediate risk prostate cancer: I had a long detailed discussion with Dr. Tobe Sos and his wife, who is a Marine scientist, today regarding his prostate cancer diagnosis and options for management. He is very well informed through his prior discussions. Based on his life expectancy and disease parameters, I did recommend therapy of curative intent. We specifically focused our discussion on surgical options and radiation therapy options. The patient was counseled about the natural history of prostate cancer and the standard treatment options that are available for prostate cancer. It was explained to him how his age and life expectancy, clinical stage, Gleason score, and PSA affect his prognosis, the decision to proceed with additional staging studies, as well as how that information influences recommended treatment strategies. We discussed the roles for active surveillance, radiation therapy, surgical therapy, androgen deprivation, as well as ablative therapy options for the treatment of prostate cancer as appropriate to his individual cancer situation. We discussed the risks and benefits of these options with regard to their impact on cancer control and also in terms of potential adverse events, complications, and impact on quality of  life particularly related to urinary and sexual function. The patient was encouraged to ask questions throughout the discussion today and all questions were answered to his stated satisfaction. In addition, the patient was provided with and/or directed to appropriate resources and literature for further education about prostate cancer and treatment options.   He does have significant lower urinary tract symptoms although his prostate is not particularly enlarged raising the question as to the exact etiology of his symptoms. I did offer him the option of considering a urodynamic evaluation if it would impact his treatment decision to after better grip on the cause of his urinary symptoms. Ultimately, he stated that this would not likely impact his decision for treatment.   We discussed surgical therapy for prostate cancer including the different available surgical approaches. We discussed, in detail, the risks and expectations of surgery with regard to cancer control, urinary control, and erectile function as well as the expected postoperative recovery process. Additional risks of surgery including but not limited to bleeding, infection, hernia formation, nerve  damage, lymphocele formation, bowel/rectal injury potentially necessitating colostomy, damage to the urinary tract resulting in urine leakage, urethral stricture, and the cardiopulmonary risks such as myocardial infarction, stroke, death, venothromboembolism, etc. were explained. The risk of open surgical conversion for robotic/laparoscopic prostatectomy was also discussed.   Ultimately, he and his wife are going to further consider their options and appeared to be deciding between surgical treatment understanding the potential increased risk for incontinence at age 11 vs. external beam radiation therapy. Although I would typically recommend a short course of androgen deprivation for unfavorable, intermediate risk disease with radiation therapy, it might  be important to 1st check his testosterone level as his testosterone level may be quite low off replacement therapy. Furthermore, he understands the decision to proceed with androgen deprivation certainly is a risk benefit discussion.   If he elects to proceed with surgery, our tentative plan would be to perform a robot assisted laparoscopic radical prostatectomy and bilateral pelvic lymphadenectomy. Although I do not think he likely requires a wide dissection, I would not attempt to perform a nerve-sparing procedure considering his poor preoperative function and his desire for oncologic control as a priority.

## 2020-05-05 ENCOUNTER — Ambulatory Visit (HOSPITAL_COMMUNITY): Payer: 59 | Admitting: Certified Registered Nurse Anesthetist

## 2020-05-05 ENCOUNTER — Other Ambulatory Visit: Payer: Self-pay

## 2020-05-05 ENCOUNTER — Encounter (HOSPITAL_COMMUNITY): Payer: Self-pay | Admitting: Urology

## 2020-05-05 ENCOUNTER — Encounter (HOSPITAL_COMMUNITY)
Admission: RE | Disposition: A | Discharge status: Home / Self Care | Payer: Self-pay | Source: Home / Self Care | Attending: Urology

## 2020-05-05 ENCOUNTER — Observation Stay (HOSPITAL_COMMUNITY)
Admission: RE | Admit: 2020-05-05 | Discharge: 2020-05-06 | Disposition: A | Discharge status: Home / Self Care | Payer: 59 | Attending: Urology | Admitting: Urology

## 2020-05-05 DIAGNOSIS — Z7984 Long term (current) use of oral hypoglycemic drugs: Secondary | ICD-10-CM | POA: Insufficient documentation

## 2020-05-05 DIAGNOSIS — C61 Malignant neoplasm of prostate: Secondary | ICD-10-CM | POA: Diagnosis not present

## 2020-05-05 DIAGNOSIS — Z87891 Personal history of nicotine dependence: Secondary | ICD-10-CM | POA: Diagnosis not present

## 2020-05-05 DIAGNOSIS — Z7982 Long term (current) use of aspirin: Secondary | ICD-10-CM | POA: Diagnosis not present

## 2020-05-05 DIAGNOSIS — Z79899 Other long term (current) drug therapy: Secondary | ICD-10-CM | POA: Insufficient documentation

## 2020-05-05 DIAGNOSIS — E1022 Type 1 diabetes mellitus with diabetic chronic kidney disease: Secondary | ICD-10-CM | POA: Insufficient documentation

## 2020-05-05 DIAGNOSIS — E785 Hyperlipidemia, unspecified: Secondary | ICD-10-CM | POA: Diagnosis not present

## 2020-05-05 DIAGNOSIS — N189 Chronic kidney disease, unspecified: Secondary | ICD-10-CM | POA: Diagnosis not present

## 2020-05-05 DIAGNOSIS — G4733 Obstructive sleep apnea (adult) (pediatric): Secondary | ICD-10-CM | POA: Diagnosis not present

## 2020-05-05 DIAGNOSIS — E039 Hypothyroidism, unspecified: Secondary | ICD-10-CM | POA: Diagnosis not present

## 2020-05-05 DIAGNOSIS — I129 Hypertensive chronic kidney disease with stage 1 through stage 4 chronic kidney disease, or unspecified chronic kidney disease: Secondary | ICD-10-CM | POA: Diagnosis not present

## 2020-05-05 DIAGNOSIS — E119 Type 2 diabetes mellitus without complications: Secondary | ICD-10-CM | POA: Diagnosis not present

## 2020-05-05 DIAGNOSIS — I10 Essential (primary) hypertension: Secondary | ICD-10-CM | POA: Diagnosis not present

## 2020-05-05 HISTORY — PX: LYMPHADENECTOMY: SHX5960

## 2020-05-05 HISTORY — PX: ROBOT ASSISTED LAPAROSCOPIC RADICAL PROSTATECTOMY: SHX5141

## 2020-05-05 LAB — GLUCOSE, CAPILLARY
Glucose-Capillary: 103 mg/dL — ABNORMAL HIGH (ref 70–99)
Glucose-Capillary: 147 mg/dL — ABNORMAL HIGH (ref 70–99)
Glucose-Capillary: 165 mg/dL — ABNORMAL HIGH (ref 70–99)
Glucose-Capillary: 130 mg/dL — ABNORMAL HIGH (ref 70–99)

## 2020-05-05 LAB — TYPE AND SCREEN
ABO/RH(D): O POS
Antibody Screen: NEGATIVE

## 2020-05-05 LAB — HEMOGLOBIN AND HEMATOCRIT, BLOOD
HCT: 39.4 % (ref 39.0–52.0)
Hemoglobin: 13.1 g/dL (ref 13.0–17.0)

## 2020-05-05 LAB — ABO/RH: ABO/RH(D): O POS

## 2020-05-05 SURGERY — XI ROBOTIC ASSISTED LAPAROSCOPIC RADICAL PROSTATECTOMY LEVEL 2
Anesthesia: General

## 2020-05-05 MED ORDER — SUGAMMADEX SODIUM 200 MG/2ML IV SOLN
INTRAVENOUS | Status: DC | PRN
  Administered 2020-05-05: 200 mg via INTRAVENOUS

## 2020-05-05 MED ORDER — PHENYLEPHRINE 40 MCG/ML (10ML) SYRINGE FOR IV PUSH (FOR BLOOD PRESSURE SUPPORT)
PREFILLED_SYRINGE | INTRAVENOUS | Status: AC
  Filled 2020-05-05: qty 10

## 2020-05-05 MED ORDER — TRAMADOL HCL 50 MG PO TABS: 50.0000 mg | ORAL_TABLET | Freq: Four times a day (QID) | ORAL | 0 refills | Status: DC | PRN

## 2020-05-05 MED ORDER — MIDAZOLAM HCL 2 MG/2ML IJ SOLN
INTRAMUSCULAR | Status: AC
  Filled 2020-05-05: qty 2

## 2020-05-05 MED ORDER — EZETIMIBE 10 MG PO TABS
10.0000 mg | ORAL_TABLET | Freq: Every day | ORAL | Status: DC
  Administered 2020-05-06: 11:00:00 10 mg via ORAL
  Filled 2020-05-05: qty 1

## 2020-05-05 MED ORDER — EPHEDRINE SULFATE 50 MG/ML IJ SOLN
INTRAMUSCULAR | Status: DC | PRN
  Administered 2020-05-05: 10 mg via INTRAVENOUS

## 2020-05-05 MED ORDER — ORAL CARE MOUTH RINSE: 15.0000 mL | Freq: Once | OROMUCOSAL | Status: AC

## 2020-05-05 MED ORDER — FENTANYL CITRATE (PF) 100 MCG/2ML IJ SOLN
25.0000 ug | INTRAMUSCULAR | Status: DC | PRN
  Administered 2020-05-05: 25 ug via INTRAVENOUS
  Administered 2020-05-05: 50 ug via INTRAVENOUS

## 2020-05-05 MED ORDER — ZOLPIDEM TARTRATE 5 MG PO TABS
5.0000 mg | ORAL_TABLET | Freq: Every evening | ORAL | Status: DC | PRN
Start: 2020-05-05 — End: 2020-05-06

## 2020-05-05 MED ORDER — CHLORHEXIDINE GLUCONATE CLOTH 2 % EX PADS
6.0000 | MEDICATED_PAD | Freq: Every day | CUTANEOUS | Status: DC
  Administered 2020-05-05 – 2020-05-06 (×2): 6 via TOPICAL

## 2020-05-05 MED ORDER — ROCURONIUM BROMIDE 10 MG/ML (PF) SYRINGE
PREFILLED_SYRINGE | INTRAVENOUS | Status: DC | PRN
  Administered 2020-05-05 (×2): 10 mg via INTRAVENOUS
  Administered 2020-05-05: 80 mg via INTRAVENOUS

## 2020-05-05 MED ORDER — FENTANYL CITRATE (PF) 250 MCG/5ML IJ SOLN
INTRAMUSCULAR | Status: AC
  Filled 2020-05-05: qty 5

## 2020-05-05 MED ORDER — ACETAMINOPHEN 10 MG/ML IV SOLN
INTRAVENOUS | Status: AC
  Administered 2020-05-05: 18:00:00 1000 mg via INTRAVENOUS
  Filled 2020-05-05: qty 100

## 2020-05-05 MED ORDER — EPHEDRINE 5 MG/ML INJ
INTRAVENOUS | Status: AC
  Filled 2020-05-05: qty 10

## 2020-05-05 MED ORDER — POTASSIUM CHLORIDE IN NACL 20-0.45 MEQ/L-% IV SOLN
INTRAVENOUS | Status: DC
  Filled 2020-05-05 (×2): qty 1000

## 2020-05-05 MED ORDER — DIPHENHYDRAMINE HCL 12.5 MG/5ML PO ELIX: 12.5000 mg | ORAL_SOLUTION | Freq: Four times a day (QID) | ORAL | Status: DC | PRN

## 2020-05-05 MED ORDER — SODIUM CHLORIDE 0.9 % IV BOLUS
1000.0000 mL | Freq: Once | INTRAVENOUS | Status: AC
  Administered 2020-05-05: 12:00:00 1000 mL via INTRAVENOUS

## 2020-05-05 MED ORDER — SODIUM CHLORIDE 0.9 % IR SOLN
Status: DC | PRN
  Administered 2020-05-05: 1000 mL via INTRAVESICAL

## 2020-05-05 MED ORDER — FENTANYL CITRATE (PF) 100 MCG/2ML IJ SOLN
INTRAMUSCULAR | Status: AC
  Administered 2020-05-05: 12:00:00 25 ug via INTRAVENOUS
  Filled 2020-05-05: qty 2

## 2020-05-05 MED ORDER — DOCUSATE SODIUM 100 MG PO CAPS
100.0000 mg | ORAL_CAPSULE | Freq: Two times a day (BID) | ORAL | Status: DC
  Administered 2020-05-05 – 2020-05-06 (×2): 100 mg via ORAL
  Filled 2020-05-05 (×2): qty 1

## 2020-05-05 MED ORDER — DEXAMETHASONE SODIUM PHOSPHATE 10 MG/ML IJ SOLN
INTRAMUSCULAR | Status: AC
  Filled 2020-05-05: qty 1

## 2020-05-05 MED ORDER — LEVOTHYROXINE SODIUM 25 MCG PO TABS
137.0000 ug | ORAL_TABLET | Freq: Every day | ORAL | Status: DC
  Administered 2020-05-06: 06:00:00 137 ug via ORAL
  Filled 2020-05-05: qty 1

## 2020-05-05 MED ORDER — STERILE WATER FOR IRRIGATION IR SOLN
Status: DC | PRN
  Administered 2020-05-05: 1000 mL

## 2020-05-05 MED ORDER — DIPHENHYDRAMINE HCL 50 MG/ML IJ SOLN
12.5000 mg | Freq: Four times a day (QID) | INTRAMUSCULAR | Status: DC | PRN
Start: 2020-05-05 — End: 2020-05-06

## 2020-05-05 MED ORDER — HEPARIN SODIUM (PORCINE) 1000 UNIT/ML IJ SOLN
INTRAMUSCULAR | Status: AC
  Filled 2020-05-05: qty 1

## 2020-05-05 MED ORDER — PROPOFOL 10 MG/ML IV BOLUS
INTRAVENOUS | Status: AC
  Filled 2020-05-05: qty 20

## 2020-05-05 MED ORDER — LACTATED RINGERS IV SOLN
INTRAVENOUS | Status: DC | PRN
  Administered 2020-05-05: 08:00:00 1000 mL

## 2020-05-05 MED ORDER — PHENYLEPHRINE 40 MCG/ML (10ML) SYRINGE FOR IV PUSH (FOR BLOOD PRESSURE SUPPORT)
PREFILLED_SYRINGE | INTRAVENOUS | Status: DC | PRN
  Administered 2020-05-05 (×3): 80 ug via INTRAVENOUS
  Administered 2020-05-05: 120 ug via INTRAVENOUS

## 2020-05-05 MED ORDER — BELLADONNA ALKALOIDS-OPIUM 16.2-60 MG RE SUPP
1.0000 | Freq: Four times a day (QID) | RECTAL | Status: DC | PRN
  Administered 2020-05-05: 1 via RECTAL

## 2020-05-05 MED ORDER — LACTATED RINGERS IV SOLN: INTRAVENOUS | Status: DC

## 2020-05-05 MED ORDER — ONDANSETRON HCL 4 MG/2ML IJ SOLN
INTRAMUSCULAR | Status: DC | PRN
  Administered 2020-05-05: 4 mg via INTRAVENOUS

## 2020-05-05 MED ORDER — MAGNESIUM CITRATE PO SOLN: 1.0000 | Freq: Once | ORAL | Status: DC

## 2020-05-05 MED ORDER — LABETALOL HCL 5 MG/ML IV SOLN
5.0000 mg | INTRAVENOUS | Status: DC | PRN
Start: 2020-05-05 — End: 2020-05-05

## 2020-05-05 MED ORDER — BUPIVACAINE-EPINEPHRINE (PF) 0.25% -1:200000 IJ SOLN
INTRAMUSCULAR | Status: DC | PRN
  Administered 2020-05-05: 30 mL

## 2020-05-05 MED ORDER — CEFAZOLIN SODIUM-DEXTROSE 2-4 GM/100ML-% IV SOLN
2.0000 g | Freq: Once | INTRAVENOUS | Status: AC
  Administered 2020-05-05: 08:00:00 2 g via INTRAVENOUS
  Filled 2020-05-05: qty 100

## 2020-05-05 MED ORDER — PHENYLEPHRINE HCL (PRESSORS) 10 MG/ML IV SOLN
INTRAVENOUS | Status: AC
  Filled 2020-05-05: qty 1

## 2020-05-05 MED ORDER — ROCURONIUM BROMIDE 10 MG/ML (PF) SYRINGE
PREFILLED_SYRINGE | INTRAVENOUS | Status: AC
  Filled 2020-05-05: qty 10

## 2020-05-05 MED ORDER — SULFAMETHOXAZOLE-TRIMETHOPRIM 800-160 MG PO TABS: 1.0000 | ORAL_TABLET | Freq: Two times a day (BID) | ORAL | 0 refills | Status: DC

## 2020-05-05 MED ORDER — ONDANSETRON HCL 4 MG/2ML IJ SOLN: 4.0000 mg | INTRAMUSCULAR | Status: DC | PRN

## 2020-05-05 MED ORDER — ATORVASTATIN CALCIUM 40 MG PO TABS
80.0000 mg | ORAL_TABLET | Freq: Every day | ORAL | Status: DC
  Administered 2020-05-06: 11:00:00 80 mg via ORAL
  Filled 2020-05-05 (×3): qty 2

## 2020-05-05 MED ORDER — CEFAZOLIN SODIUM-DEXTROSE 1-4 GM/50ML-% IV SOLN
1.0000 g | Freq: Three times a day (TID) | INTRAVENOUS | Status: AC
  Administered 2020-05-05 – 2020-05-06 (×2): 1 g via INTRAVENOUS
  Filled 2020-05-05 (×2): qty 50

## 2020-05-05 MED ORDER — KETAMINE HCL 10 MG/ML IJ SOLN
INTRAMUSCULAR | Status: AC
  Filled 2020-05-05: qty 1

## 2020-05-05 MED ORDER — MIDAZOLAM HCL 2 MG/2ML IJ SOLN
INTRAMUSCULAR | Status: DC | PRN
  Administered 2020-05-05: 2 mg via INTRAVENOUS

## 2020-05-05 MED ORDER — TELMISARTAN-HCTZ 40-12.5 MG PO TABS: 0.5000 | ORAL_TABLET | Freq: Every day | ORAL | Status: DC

## 2020-05-05 MED ORDER — BUPIVACAINE-EPINEPHRINE (PF) 0.25% -1:200000 IJ SOLN
INTRAMUSCULAR | Status: AC
  Filled 2020-05-05: qty 30

## 2020-05-05 MED ORDER — PROPOFOL 10 MG/ML IV BOLUS
INTRAVENOUS | Status: DC | PRN
  Administered 2020-05-05: 100 mg via INTRAVENOUS

## 2020-05-05 MED ORDER — LIDOCAINE HCL (PF) 2 % IJ SOLN
INTRAMUSCULAR | Status: AC
  Filled 2020-05-05: qty 5

## 2020-05-05 MED ORDER — IRBESARTAN 150 MG PO TABS
75.0000 mg | ORAL_TABLET | Freq: Every day | ORAL | Status: DC
  Administered 2020-05-06: 11:00:00 75 mg via ORAL
  Filled 2020-05-05 (×3): qty 1

## 2020-05-05 MED ORDER — HYDROCHLOROTHIAZIDE 10 MG/ML ORAL SUSPENSION
6.2500 mg | Freq: Every day | ORAL | Status: DC
  Administered 2020-05-06: 12:00:00 6.25 mg via ORAL
  Filled 2020-05-05 (×2): qty 1.25

## 2020-05-05 MED ORDER — ACETAMINOPHEN 500 MG PO TABS: 1000.0000 mg | ORAL_TABLET | Freq: Once | ORAL | Status: AC

## 2020-05-05 MED ORDER — BELLADONNA ALKALOIDS-OPIUM 16.2-60 MG RE SUPP
RECTAL | Status: AC
  Filled 2020-05-05: qty 1

## 2020-05-05 MED ORDER — LABETALOL HCL 5 MG/ML IV SOLN
INTRAVENOUS | Status: AC
  Administered 2020-05-05: 13:00:00 5 mg via INTRAVENOUS
  Filled 2020-05-05: qty 4

## 2020-05-05 MED ORDER — KETAMINE HCL 10 MG/ML IJ SOLN
INTRAMUSCULAR | Status: DC | PRN
  Administered 2020-05-05: 30 mg via INTRAVENOUS

## 2020-05-05 MED ORDER — DEXAMETHASONE SODIUM PHOSPHATE 10 MG/ML IJ SOLN
INTRAMUSCULAR | Status: DC | PRN
  Administered 2020-05-05: 8 mg via INTRAVENOUS

## 2020-05-05 MED ORDER — BACITRACIN-NEOMYCIN-POLYMYXIN 400-5-5000 EX OINT: 1.0000 "application " | TOPICAL_OINTMENT | Freq: Three times a day (TID) | CUTANEOUS | Status: DC | PRN

## 2020-05-05 MED ORDER — FLEET ENEMA 7-19 GM/118ML RE ENEM: 1.0000 | ENEMA | Freq: Once | RECTAL | Status: DC

## 2020-05-05 MED ORDER — LIDOCAINE HCL (CARDIAC) PF 100 MG/5ML IV SOSY
PREFILLED_SYRINGE | INTRAVENOUS | Status: DC | PRN
  Administered 2020-05-05: 80 mg via INTRAVENOUS

## 2020-05-05 MED ORDER — ALBUTEROL SULFATE HFA 108 (90 BASE) MCG/ACT IN AERS: 2.0000 | INHALATION_SPRAY | Freq: Four times a day (QID) | RESPIRATORY_TRACT | Status: DC | PRN

## 2020-05-05 MED ORDER — FENTANYL CITRATE (PF) 250 MCG/5ML IJ SOLN
INTRAMUSCULAR | Status: DC | PRN
  Administered 2020-05-05 (×5): 50 ug via INTRAVENOUS

## 2020-05-05 MED ORDER — PANTOPRAZOLE SODIUM 40 MG PO TBEC
40.0000 mg | DELAYED_RELEASE_TABLET | Freq: Every day | ORAL | Status: DC
  Administered 2020-05-06: 12:00:00 40 mg via ORAL
  Filled 2020-05-05: qty 1

## 2020-05-05 MED ORDER — PHENYLEPHRINE HCL-NACL 10-0.9 MG/250ML-% IV SOLN
INTRAVENOUS | Status: DC | PRN
  Administered 2020-05-05: 25 ug/min via INTRAVENOUS

## 2020-05-05 MED ORDER — ONDANSETRON HCL 4 MG/2ML IJ SOLN
INTRAMUSCULAR | Status: AC
  Filled 2020-05-05: qty 2

## 2020-05-05 MED ORDER — ALLOPURINOL 300 MG PO TABS
300.0000 mg | ORAL_TABLET | Freq: Every day | ORAL | Status: DC
  Administered 2020-05-06: 11:00:00 300 mg via ORAL
  Filled 2020-05-05: qty 1

## 2020-05-05 MED ORDER — CHLORHEXIDINE GLUCONATE 0.12 % MT SOLN
15.0000 mL | Freq: Once | OROMUCOSAL | Status: AC
  Administered 2020-05-05: 06:00:00 15 mL via OROMUCOSAL

## 2020-05-05 MED ORDER — ACETAMINOPHEN 10 MG/ML IV SOLN
INTRAVENOUS | Status: DC | PRN
  Administered 2020-05-05: 1000 mg via INTRAVENOUS

## 2020-05-05 MED ORDER — ACETAMINOPHEN 325 MG PO TABS: 650.0000 mg | ORAL_TABLET | ORAL | Status: DC | PRN

## 2020-05-05 MED ORDER — INSULIN ASPART 100 UNIT/ML ~~LOC~~ SOLN: 0.0000 [IU] | SUBCUTANEOUS | Status: DC

## 2020-05-05 MED ORDER — ACETAMINOPHEN 10 MG/ML IV SOLN
1000.0000 mg | Freq: Four times a day (QID) | INTRAVENOUS | Status: DC
  Administered 2020-05-06 (×2): 1000 mg via INTRAVENOUS
  Filled 2020-05-05 (×4): qty 100

## 2020-05-05 MED ORDER — MORPHINE SULFATE (PF) 2 MG/ML IV SOLN: 2.0000 mg | INTRAVENOUS | Status: DC | PRN

## 2020-05-05 SURGICAL SUPPLY — 63 items
ADH SKN CLS APL DERMABOND .7 (GAUZE/BANDAGES/DRESSINGS) ×2
APL PRP STRL LF DISP 70% ISPRP (MISCELLANEOUS) ×2
APL SWBSTK 6 STRL LF DISP (MISCELLANEOUS) ×2
APPLICATOR COTTON TIP 6 STRL (MISCELLANEOUS) ×2 IMPLANT
APPLICATOR COTTON TIP 6IN STRL (MISCELLANEOUS) ×4
CATH FOLEY 2WAY SLVR 18FR 30CC (CATHETERS) ×4 IMPLANT
CATH ROBINSON RED A/P 16FR (CATHETERS) ×4 IMPLANT
CATH ROBINSON RED A/P 8FR (CATHETERS) ×4 IMPLANT
CATH TIEMANN FOLEY 18FR 5CC (CATHETERS) ×4 IMPLANT
CHLORAPREP W/TINT 26 (MISCELLANEOUS) ×4 IMPLANT
CLIP VESOLOCK LG 6/CT PURPLE (CLIP) ×8 IMPLANT
COVER SURGICAL LIGHT HANDLE (MISCELLANEOUS) ×4 IMPLANT
COVER TIP SHEARS 8 DVNC (MISCELLANEOUS) ×2 IMPLANT
COVER TIP SHEARS 8MM DA VINCI (MISCELLANEOUS) ×4
COVER WAND RF STERILE (DRAPES) IMPLANT
CUTTER ECHEON FLEX ENDO 45 340 (ENDOMECHANICALS) ×4 IMPLANT
DECANTER SPIKE VIAL GLASS SM (MISCELLANEOUS) ×4 IMPLANT
DERMABOND ADVANCED (GAUZE/BANDAGES/DRESSINGS) ×2
DERMABOND ADVANCED .7 DNX12 (GAUZE/BANDAGES/DRESSINGS) ×2 IMPLANT
DRAIN CHANNEL RND F F (WOUND CARE) IMPLANT
DRAPE ARM DVNC X/XI (DISPOSABLE) ×8 IMPLANT
DRAPE COLUMN DVNC XI (DISPOSABLE) ×2 IMPLANT
DRAPE DA VINCI XI ARM (DISPOSABLE) ×16
DRAPE DA VINCI XI COLUMN (DISPOSABLE) ×4
DRAPE SURG IRRIG POUCH 19X23 (DRAPES) ×4 IMPLANT
DRSG TEGADERM 4X4.75 (GAUZE/BANDAGES/DRESSINGS) ×4 IMPLANT
ELECT PENCIL ROCKER SW 15FT (MISCELLANEOUS) ×2 IMPLANT
ELECT REM PT RETURN 15FT ADLT (MISCELLANEOUS) ×4 IMPLANT
GLOVE BIO SURGEON STRL SZ 6.5 (GLOVE) ×3 IMPLANT
GLOVE BIO SURGEONS STRL SZ 6.5 (GLOVE) ×1
GLOVE BIOGEL M STRL SZ7.5 (GLOVE) ×8 IMPLANT
GOWN STRL REUS W/TWL LRG LVL3 (GOWN DISPOSABLE) ×12 IMPLANT
HOLDER FOLEY CATH W/STRAP (MISCELLANEOUS) ×4 IMPLANT
IRRIG SUCT STRYKERFLOW 2 WTIP (MISCELLANEOUS) ×4
IRRIGATION SUCT STRKRFLW 2 WTP (MISCELLANEOUS) ×2 IMPLANT
IV LACTATED RINGERS 1000ML (IV SOLUTION) ×4 IMPLANT
KIT TURNOVER KIT A (KITS) IMPLANT
NDL SAFETY ECLIPSE 18X1.5 (NEEDLE) ×2 IMPLANT
NEEDLE HYPO 18GX1.5 SHARP (NEEDLE) ×4
PACK ROBOT UROLOGY CUSTOM (CUSTOM PROCEDURE TRAY) ×4 IMPLANT
PENCIL SMOKE EVACUATOR (MISCELLANEOUS) IMPLANT
RELOAD STAPLE 45 4.1 GRN THCK (STAPLE) ×2 IMPLANT
SEAL CANN UNIV 5-8 DVNC XI (MISCELLANEOUS) ×8 IMPLANT
SEAL XI 5MM-8MM UNIVERSAL (MISCELLANEOUS) ×16
SET TUBE SMOKE EVAC HIGH FLOW (TUBING) ×4 IMPLANT
SOLUTION ELECTROLUBE (MISCELLANEOUS) ×4 IMPLANT
STAPLE RELOAD 45 GRN (STAPLE) ×2 IMPLANT
STAPLE RELOAD 45MM GREEN (STAPLE) ×4
SUT ETHILON 3 0 PS 1 (SUTURE) ×4 IMPLANT
SUT MNCRL 3 0 RB1 (SUTURE) ×2 IMPLANT
SUT MNCRL 3 0 VIOLET RB1 (SUTURE) ×2 IMPLANT
SUT MNCRL AB 4-0 PS2 18 (SUTURE) ×8 IMPLANT
SUT MONOCRYL 3 0 RB1 (SUTURE) ×8
SUT VIC AB 0 CT1 27 (SUTURE) ×4
SUT VIC AB 0 CT1 27XBRD ANTBC (SUTURE) ×2 IMPLANT
SUT VIC AB 0 UR5 27 (SUTURE) ×4 IMPLANT
SUT VIC AB 2-0 SH 27 (SUTURE) ×4
SUT VIC AB 2-0 SH 27X BRD (SUTURE) ×2 IMPLANT
SUT VICRYL 0 UR6 27IN ABS (SUTURE) ×8 IMPLANT
SYR 27GX1/2 1ML LL SAFETY (SYRINGE) ×4 IMPLANT
TOWEL OR NON WOVEN STRL DISP B (DISPOSABLE) ×4 IMPLANT
TROCAR XCEL NON-BLD 5MMX100MML (ENDOMECHANICALS) IMPLANT
WATER STERILE IRR 1000ML POUR (IV SOLUTION) ×4 IMPLANT

## 2020-05-05 NOTE — Op Note (Signed)
Preoperative diagnosis: Clinically localized adenocarcinoma of the prostate (clinical stage T1c N0 M0)  Postoperative diagnosis: Clinically localized adenocarcinoma of the prostate (clinical stage T1c N0 M0)  Procedure:  1. Robotic assisted laparoscopic radical prostatectomy (bilateral nerve sparing) 2. Bilateral robotic assisted laparoscopic pelvic lymphadenectomy  Surgeon: Pryor Curia. M.D.  Resident: Dr. Ermelinda Das  An assistant was required for this surgical procedure.  The duties of the assistant included but were not limited to suctioning, passing suture, camera manipulation, retraction. This procedure would not be able to be performed without an Environmental consultant.  Anesthesia: General  Complications: None  EBL: 100 mL  IVF:  1500 mL crystalloid  Specimens: 1. Prostate and seminal vesicles 2. Right pelvic lymph nodes 3. Left pelvic lymph nodes  Disposition of specimens: Pathology  Drains: 1. 20 Fr coude catheter 2. # 19 Blake pelvic drain  Indication: [name] is a 74 y.o. year old patient with clinically localized prostate cancer.  After a thorough review of the management options for treatment of prostate cancer, he elected to proceed with surgical therapy and the above procedure(s).  We have discussed the potential benefits and risks of the procedure, side effects of the proposed treatment, the likelihood of the patient achieving the goals of the procedure, and any potential problems that might occur during the procedure or recuperation. Informed consent has been obtained.  Description of procedure:  The patient was taken to the operating room and a general anesthetic was administered. He was given preoperative antibiotics, placed in the dorsal lithotomy position, and prepped and draped in the usual sterile fashion. Next a preoperative timeout was performed. A urethral catheter was placed into the bladder and a site was selected near the umbilicus for  placement of the camera port. This was placed using a standard open Hassan technique which allowed entry into the peritoneal cavity under direct vision and without difficulty. An 8 mm robotic port was placed and a pneumoperitoneum established. The camera was then used to inspect the abdomen and there was no evidence of any intra-abdominal injuries or other abnormalities. The remaining abdominal ports were then placed. 8 mm robotic ports were placed in the right lower quadrant, left lower quadrant, and far left lateral abdominal wall. A 5 mm port was placed in the right upper quadrant and a 12 mm port was placed in the right lateral abdominal wall for laparoscopic assistance. All ports were placed under direct vision without difficulty. The surgical cart was then docked.   Utilizing the cautery scissors, the bladder was reflected posteriorly allowing entry into the space of Retzius and identification of the endopelvic fascia and prostate. The periprostatic fat was then removed from the prostate allowing full exposure of the endopelvic fascia. The endopelvic fascia was then incised from the apex back to the base of the prostate bilaterally and the underlying levator muscle fibers were swept laterally off the prostate thereby isolating the dorsal venous complex. The dorsal vein was then stapled and divided with a 45 mm Flex Echelon stapler. Attention then turned to the bladder neck which was divided anteriorly thereby allowing entry into the bladder and exposure of the urethral catheter. The catheter balloon was deflated and the catheter was brought into the operative field and used to retract the prostate anteriorly. The posterior bladder neck was then examined and was divided allowing further dissection between the bladder and prostate posteriorly until the vasa deferentia and seminal vessels were identified. The vasa deferentia were isolated, divided, and lifted anteriorly.  The seminal vesicles were dissected down  to their tips with care to control the seminal vascular arterial blood supply. These structures were then lifted anteriorly and the space between Denonvillier's fascia and the anterior rectum was developed with a combination of sharp and blunt dissection. This isolated the vascular pedicles of the prostate.  The lateral prostatic fascia was then sharply incised allowing release of the neurovascular bundles bilaterally. However, some of the periprostatic fat tissue was left on the prostate considering his baseline erectile dysfunction. The vascular pedicles of the prostate were then ligated with Weck clips between the prostate and neurovascular bundles and divided with sharp cold scissor dissection resulting in neurovascular bundle preservation. The neurovascular bundles were then separated off the apex of the prostate and urethra bilaterally.  The urethra was then sharply transected allowing the prostate specimen to be disarticulated. The pelvis was copiously irrigated and hemostasis was ensured. There was no evidence for rectal injury.  Attention then turned to the right pelvic sidewall. The fibrofatty tissue between the external iliac vein, confluence of the iliac vessels, hypogastric artery, and Cooper's ligament was dissected free from the pelvic sidewall with care to preserve the obturator nerve. Weck clips were used for lymphostasis and hemostasis. An identical procedure was performed on the contralateral side and the lymphatic packets were removed for permanent pathologic analysis.  Attention then turned to the urethral anastomosis. A 2-0 Vicryl slip knot was placed between Denonvillier's fascia, the posterior bladder neck, and the posterior urethra to reapproximate these structures. A double-armed 3-0 Monocryl suture was then used to perform a 360 running tension-free anastomosis between the bladder neck and urethra. A new urethral catheter was then placed into the bladder and irrigated. There were  no blood clots within the bladder and the anastomosis appeared to be watertight. A #19 Blake drain was then brought through the left lateral 8 mm port site and positioned appropriately within the pelvis. It was secured to the skin with a nylon suture. The surgical cart was then undocked. The right lateral 12 mm port site was closed at the fascial level with a 0 Vicryl suture placed laparoscopically. All remaining ports were then removed under direct vision. The prostate specimen was removed intact within the Endopouch retrieval bag via the periumbilical camera port site. This fascial opening was closed with two running 0 Vicryl sutures. 0.25% Marcaine was then injected into all port sites and all incisions were reapproximated at the skin level with 4-0 Monocryl subcuticular sutures and Dermabond. The patient appeared to tolerate the procedure well and without complications. The patient was able to be extubated and transferred to the recovery unit in satisfactory condition.   Pryor Curia MD

## 2020-05-05 NOTE — Progress Notes (Signed)
Patient ID: [name], male   DOB: 03-Apr-1946, 74 y.o.   MRN: [id number]  Post-op note  Subjective: The patient is doing well.  C/O bladder spasms.  Objective: Vital signs in last 24 hours: Temp:  [97 F (36.1 C)-98.2 F (36.8 C)] 98 F (36.7 C) (12/20 1539) Pulse Rate:  [58-130] 65 (12/20 1539) Resp:  [13-26] 16 (12/20 1539) BP: (111-154)/(48-88) 124/77 (12/20 1539) SpO2:  [85 %-99 %] 96 % (12/20 1539) Weight:  [79.4 kg] 79.4 kg (12/20 1539)  Intake/Output from previous day: No intake/output data recorded. Intake/Output this shift: Total I/O In: 1854.6 [I.V.:1654.6; IV Piggyback:200] Out: 1625 [Urine:1500; Drains:25; Blood:100]  Physical Exam:  General: Alert and oriented. Abdomen: Soft, Nondistended. Incisions: Clean and dry. GU: Urine clear.  Lab Results: Recent Labs    05/05/20 1114  HGB 13.1  HCT 39.4    Assessment/Plan: POD#0   1) Continue to monitor, ambulate, IS, B&O suppository   Roxy Horseman, Brooke Bonito. MD   LOS: 0 days   Dutch Gray 05/05/2020, 4:01 PM

## 2020-05-05 NOTE — Anesthesia Preprocedure Evaluation (Addendum)
Anesthesia Evaluation  Patient identified by MRN, date of birth, ID band Patient awake    Reviewed: Allergy & Precautions, NPO status , Patient's Chart, lab work & pertinent test results  Airway Mallampati: II  TM Distance: >3 FB Neck ROM: Full    Dental no notable dental hx. (+) Teeth Intact, Dental Advisory Given   Pulmonary sleep apnea and Continuous Positive Airway Pressure Ventilation , former smoker,    Pulmonary exam normal breath sounds clear to auscultation       Cardiovascular hypertension, Pt. on medications Normal cardiovascular exam Rhythm:Regular Rate:Normal     Neuro/Psych Seizures - (remote history),  negative psych ROS   GI/Hepatic Neg liver ROS, GERD  Controlled and Medicated,  Endo/Other  diabetes, Type 2, Oral Hypoglycemic AgentsHypothyroidism   Renal/GU Renal InsufficiencyRenal disease (Cr 1.39, K 3.7)  negative genitourinary   Musculoskeletal  (+) Arthritis ,   Abdominal   Peds  Hematology negative hematology ROS (+)   Anesthesia Other Findings Prostate CA  Reproductive/Obstetrics                            Anesthesia Physical Anesthesia Plan  ASA: III  Anesthesia Plan: General   Post-op Pain Management:    Induction: Intravenous  PONV Risk Score and Plan: 2 and Midazolam, Dexamethasone and Ondansetron  Airway Management Planned: Oral ETT  Additional Equipment:   Intra-op Plan:   Post-operative Plan: Extubation in OR  Informed Consent: I have reviewed the patients History and Physical, chart, labs and discussed the procedure including the risks, benefits and alternatives for the proposed anesthesia with the patient or authorized representative who has indicated his/her understanding and acceptance.     Dental advisory given  Plan Discussed with: CRNA  Anesthesia Plan Comments:         Anesthesia Quick Evaluation

## 2020-05-05 NOTE — Anesthesia Postprocedure Evaluation (Signed)
Anesthesia Post Note  Patient: [name]  Procedure(s) Performed: XI ROBOTIC ASSISTED LAPAROSCOPIC RADICAL PROSTATECTOMY LEVEL 2 (N/A ) LYMPHADENECTOMY, PELVIC (Bilateral )     Patient location during evaluation: PACU Anesthesia Type: General Level of consciousness: awake and sedated Pain management: pain level controlled Vital Signs Assessment: post-procedure vital signs reviewed and stable Respiratory status: spontaneous breathing Cardiovascular status: stable Postop Assessment: no apparent nausea or vomiting Anesthetic complications: no   No complications documented.  Last Vitals:  Vitals:   05/05/20 1215 05/05/20 1245  BP: 138/88 (!) 149/82  Pulse: (!) 126 (!) 118  Resp:  17  Temp:    SpO2: 93% 94%    Last Pain:  Vitals:   05/05/20 1245  TempSrc:   PainSc: Asleep   Pain Goal:                   Huston Foley

## 2020-05-05 NOTE — Transfer of Care (Signed)
Immediate Anesthesia Transfer of Care Note  Patient: [name]  Procedure(s) Performed: XI ROBOTIC ASSISTED LAPAROSCOPIC RADICAL PROSTATECTOMY LEVEL 2 (N/A ) LYMPHADENECTOMY, PELVIC (Bilateral )  Patient Location: PACU  Anesthesia Type:General  Level of Consciousness: awake, alert , oriented and patient cooperative  Airway & Oxygen Therapy: Patient Spontanous Breathing and Patient connected to face mask oxygen  Post-op Assessment: Report given to RN, Post -op Vital signs reviewed and stable and Patient moving all extremities  Post vital signs: Reviewed and stable  Last Vitals:  Vitals Value Taken Time  BP    Temp    Pulse    Resp    SpO2      Last Pain:  Vitals:   05/05/20 0559  TempSrc: Oral  PainSc: 0-No pain         Complications: No complications documented.

## 2020-05-05 NOTE — Anesthesia Procedure Notes (Signed)
Procedure Name: Intubation Date/Time: 05/05/2020 7:30 AM Performed by: Raenette Rover, CRNA Pre-anesthesia Checklist: Patient identified, Emergency Drugs available, Suction available and Patient being monitored Patient Re-evaluated:Patient Re-evaluated prior to induction Oxygen Delivery Method: Circle system utilized Preoxygenation: Pre-oxygenation with 100% oxygen Induction Type: IV induction Ventilation: Mask ventilation without difficulty and Oral airway inserted - appropriate to patient size Laryngoscope Size: Mac and 4 Grade View: Grade I Tube type: Oral Tube size: 7.5 mm Number of attempts: 1 Airway Equipment and Method: Stylet Placement Confirmation: ETT inserted through vocal cords under direct vision,  positive ETCO2 and breath sounds checked- equal and bilateral Secured at: 21 cm Tube secured with: Tape Dental Injury: Teeth and Oropharynx as per pre-operative assessment

## 2020-05-05 NOTE — Discharge Instructions (Signed)
1. Activity:  You are encouraged to ambulate frequently (about every hour during waking hours) to help prevent blood clots from forming in your legs or lungs.  However, you should not engage in any heavy lifting (> 10-15 lbs), strenuous activity, or straining. 2. Diet: You should continue a clear liquid diet until passing gas from below.  Once this occurs, you may advance your diet to a soft diet that would be easy to digest (i.e soups, scrambled eggs, mashed potatoes, etc.) for 24 hours just as you would if getting over a bad stomach flu.  If tolerating this diet well for 24 hours, you may then begin eating regular food.  It will be normal to have some amount of bloating, nausea, and abdominal discomfort intermittently. 3. Prescriptions:  You will be provided a prescription for pain medication to take as needed.  If your pain is not severe enough to require the prescription pain medication, you may take extra strength Tylenol instead.  You should also take an over the counter stool softener (Colace 100 mg twice daily) to avoid straining with bowel movements as the pain medication may constipate you. Finally, you will also be provided a prescription for an antibiotic to begin the day prior to your return visit in the office for catheter removal. 4. Catheter care: You will be taught how to take care of the catheter by the nursing staff prior to discharge from the hospital.  You may use both a leg bag and the larger bedside bag but it is recommended to at least use the bigger bedside bag at nighttime as the leg bag is small and will fill up overnight and also does not drain as well when lying flat. You may periodically feel a strong urge to void with the catheter in place.  This is a bladder spasm and most often can occur when having a bowel movement or when you are moving around. It is typically self-limited and usually will stop after a few minutes.  You may use some Vaseline or Neosporin around the tip of the  catheter to reduce friction at the tip of the penis. 5. Incisions: Your incisions each have Dermabond (a skin glue) over them.  You may start showering (not soaking or bathing in water) 48 hours after surgery and the incisions simply need to be patted dry after the shower.  No additional care is needed.  You may keep a bandage over the drain site until it is no longer draining, typically in about 2 or 3 days. 6. What to call us about: You should call the office [phone]) if you develop fever > 101, persistent vomiting, or the catheter stops draining. Also, feel free to call with any other questions you may have and remember the handout that was provided to you as a reference preoperatively which answers many of the common questions that arise after surgery.

## 2020-05-06 ENCOUNTER — Encounter (HOSPITAL_COMMUNITY): Payer: Self-pay | Admitting: Urology

## 2020-05-06 DIAGNOSIS — C61 Malignant neoplasm of prostate: Secondary | ICD-10-CM | POA: Diagnosis not present

## 2020-05-06 LAB — GLUCOSE, CAPILLARY
Glucose-Capillary: 102 mg/dL — ABNORMAL HIGH (ref 70–99)
Glucose-Capillary: 108 mg/dL — ABNORMAL HIGH (ref 70–99)
Glucose-Capillary: 135 mg/dL — ABNORMAL HIGH (ref 70–99)
Glucose-Capillary: 143 mg/dL — ABNORMAL HIGH (ref 70–99)

## 2020-05-06 LAB — HEMOGLOBIN AND HEMATOCRIT, BLOOD
HCT: 31.9 % — ABNORMAL LOW (ref 39.0–52.0)
HCT: 39.1 % (ref 39.0–52.0)
Hemoglobin: 10.7 g/dL — ABNORMAL LOW (ref 13.0–17.0)
Hemoglobin: 13.2 g/dL (ref 13.0–17.0)

## 2020-05-06 MED ORDER — TRAMADOL HCL 50 MG PO TABS: 50.0000 mg | ORAL_TABLET | Freq: Four times a day (QID) | ORAL | Status: DC | PRN

## 2020-05-06 MED ORDER — BISACODYL 10 MG RE SUPP
10.0000 mg | Freq: Once | RECTAL | Status: AC
  Administered 2020-05-06: 08:00:00 10 mg via RECTAL
  Filled 2020-05-06: qty 1

## 2020-05-06 NOTE — Discharge Summary (Signed)
Date of admission: 05/05/2020  Date of discharge: 05/06/2020  Admission diagnosis: Prostate Cancer  Discharge diagnosis: Prostate Cancer  History and Physical: For full details, please see admission history and physical. Briefly, [name] is a 74 y.o. gentleman with localized prostate cancer.  After discussing management/treatment options, he elected to proceed with surgical treatment.  Hospital Course: [name] was taken to the operating room on 05/05/2020 and underwent a robotic assisted laparoscopic radical prostatectomy. He tolerated this procedure well and without complications. Postoperatively, he was able to be transferred to a regular hospital room following recovery from anesthesia.  He was able to begin ambulating the night of surgery. He remained hemodynamically stable overnight.  He had excellent urine output with appropriately minimal output from his pelvic drain and his pelvic drain was removed on POD #1.  He was transitioned to oral pain medication, tolerated a clear liquid diet, and had met all discharge criteria and was able to be discharged home later on POD#1.    Laboratory values: Recent Labs    05/05/20 1114 05/06/20 0517 05/06/20 1113  HGB 13.1 10.7* 13.2  HCT 39.4 31.9* 39.1    Disposition: Home  Discharge instruction: He was instructed to be ambulatory but to refrain from heavy lifting, strenuous activity, or driving. He was instructed on urethral catheter care.  Discharge medications:   Allergies as of 05/06/2020      Reactions   Lisinopril Hypertension   Nsaids Other (See Comments)   Kidney damage   Ranitidine Hcl Itching   IV Zantac      Medication List    STOP taking these medications   multivitamin tablet     TAKE these medications   Accu-Chek FastClix Lancets Misc Apply topically.   acetaminophen 500 MG tablet Commonly known as: TYLENOL Take 1,000 mg by mouth at bedtime.   albuterol 108 (90 Base) MCG/ACT  inhaler Commonly known as: VENTOLIN HFA Inhale 2 puffs into the lungs every 6 (six) hours as needed for wheezing or shortness of breath.   allopurinol 300 MG tablet Commonly known as: ZYLOPRIM Take 300 mg by mouth daily.   atorvastatin 80 MG tablet Commonly known as: LIPITOR Take 80 mg by mouth daily.   azelastine 0.1 % nasal spray Commonly known as: ASTELIN 1-2 puffs each nostril once or twice daily as needed What changed:   how much to take  how to take this  when to take this  reasons to take this  additional instructions   calcium carbonate 750 MG chewable tablet Commonly known as: TUMS EX Chew 2 tablets by mouth at bedtime.   Cholecalciferol 1.25 MG (50000 UT) Tabs Take 50,000 Units by mouth once a week.   ezetimibe 10 MG tablet Commonly known as: ZETIA Take 10 mg by mouth daily.   Fifty50 Glucose Meter 2.0 w/Device Kit See admin instructions.   Fortesta 10 MG/ACT (2%) Gel Generic drug: Testosterone 3 application daily.   FREESTYLE LITE test strip Generic drug: glucose blood   hydrOXYzine 25 MG tablet Commonly known as: ATARAX/VISTARIL Take 25 mg by mouth at bedtime.   ketoconazole 2 % cream Commonly known as: NIZORAL Apply 1 application topically daily as needed for irritation.   levothyroxine 137 MCG tablet Commonly known as: SYNTHROID Take 137 mcg by mouth daily.   minoxidil 2 % external solution Commonly known as: ROGAINE Apply 1 application topically daily.   RABEprazole 20 MG tablet Commonly known as: ACIPHEX Take 20 mg by mouth in the morning and  at bedtime.   simethicone 125 MG chewable tablet Commonly known as: MYLICON Chew 480 mg by mouth every 6 (six) hours as needed for flatulence.   sitaGLIPtin-metformin 50-500 MG tablet Commonly known as: JANUMET Take 2 tablets by mouth daily.   sulfamethoxazole-trimethoprim 800-160 MG tablet Commonly known as: BACTRIM DS Take 1 tablet by mouth 2 (two) times daily. Begin the day prior to  postoperative appointment for catheter removal.   tadalafil 20 MG tablet Commonly known as: CIALIS Take 20 mg by mouth daily as needed for erectile dysfunction.   telmisartan-hydrochlorothiazide 40-12.5 MG tablet Commonly known as: MICARDIS HCT Take 0.25 tablets by mouth daily.   traMADol 50 MG tablet Commonly known as: ULTRAM Take 1-2 tablets (50-100 mg total) by mouth every 6 (six) hours as needed (pain).   triamcinolone 0.1 % Commonly known as: KENALOG Apply 1 application topically every other day.   Unifine Pentips 32G X 4 MM Misc Generic drug: Insulin Pen Needle   Vascepa 1 g capsule Generic drug: icosapent Ethyl Take 2 g by mouth at bedtime.   Victoza 18 MG/3ML Sopn Generic drug: liraglutide Inject 1.2 mg into the skin daily.       Followup: He will followup in 1 week for catheter removal and to discuss his surgical pathology results.

## 2020-05-06 NOTE — Plan of Care (Signed)
  Problem: Education: Goal: Knowledge of General Education information will improve Description: Including pain rating scale, medication(s)/side effects and non-pharmacologic comfort measures Outcome: Completed/Met   Problem: Health Behavior/Discharge Planning: Goal: Ability to manage health-related needs will improve Outcome: Completed/Met   Problem: Clinical Measurements: Goal: Ability to maintain clinical measurements within normal limits will improve Outcome: Completed/Met Goal: Will remain free from infection Outcome: Completed/Met Goal: Diagnostic test results will improve Outcome: Completed/Met Goal: Respiratory complications will improve Outcome: Completed/Met Goal: Cardiovascular complication will be avoided Outcome: Completed/Met   Problem: Activity: Goal: Risk for activity intolerance will decrease Outcome: Completed/Met   Problem: Nutrition: Goal: Adequate nutrition will be maintained Outcome: Completed/Met   Problem: Coping: Goal: Level of anxiety will decrease Outcome: Completed/Met   Problem: Elimination: Goal: Will not experience complications related to bowel motility Outcome: Completed/Met Goal: Will not experience complications related to urinary retention Outcome: Completed/Met   Problem: Pain Managment: Goal: General experience of comfort will improve Outcome: Completed/Met   Problem: Safety: Goal: Ability to remain free from injury will improve Outcome: Completed/Met   Problem: Skin Integrity: Goal: Risk for impaired skin integrity will decrease Outcome: Completed/Met   Problem: Education: Goal: Knowledge of the procedure and recovery process will improve Outcome: Completed/Met   Problem: Bowel/Gastric: Goal: Gastrointestinal status for postoperative course will improve Outcome: Completed/Met   Problem: Pain Management: Goal: General experience of comfort will improve Outcome: Completed/Met   Problem: Skin Integrity: Goal:  Demonstration of wound healing without infection will improve Outcome: Completed/Met   Problem: Urinary Elimination: Goal: Ability to avoid or minimize complications of infection will improve Outcome: Completed/Met Goal: Ability to achieve and maintain urine output will improve Outcome: Completed/Met Goal: Home care management will improve Outcome: Completed/Met

## 2020-05-06 NOTE — Plan of Care (Signed)
°  Problem: Elimination: Goal: Will not experience complications related to bowel motility Outcome: Progressing   Problem: Pain Managment: Goal: General experience of comfort will improve Outcome: Progressing   Problem: Pain Management: Goal: General experience of comfort will improve Outcome: Progressing   Problem: Skin Integrity: Goal: Demonstration of wound healing without infection will improve Outcome: Progressing

## 2020-05-06 NOTE — Progress Notes (Signed)
Patient ID: [name], male   DOB: 1945-11-24, 74 y.o.   MRN: [id number]  1 Day Post-Op Subjective: The patient is doing well.  No nausea or vomiting. Pain is adequately controlled.  Ambulated well last night.    Objective: Vital signs in last 24 hours: Temp:  [97 F (36.1 C)-98.4 F (36.9 C)] 98.4 F (36.9 C) (12/21 0350) Pulse Rate:  [58-130] 60 (12/21 0350) Resp:  [13-26] 20 (12/20 2208) BP: (111-154)/(48-88) 111/59 (12/21 0350) SpO2:  [85 %-99 %] 93 % (12/21 0350) Weight:  [79.4 kg] 79.4 kg (12/20 1539)  Intake/Output from previous day: 12/20 0701 - 12/21 0700 In: 3517.3 [P.O.:240; I.V.:2815.2; IV Piggyback:422.1] Out: 2275 [Urine:2150; Drains:25; Blood:100] Intake/Output this shift: No intake/output data recorded.  Physical Exam:  General: Alert and oriented. GI: Soft, Nondistended. Incisions: Clean, dry, and intact Urine: Clear   Lab Results: Recent Labs    05/05/20 1114 05/06/20 0517  HGB 13.1 10.7*  HCT 39.4 31.9*      Assessment/Plan: POD# 1 s/p robotic prostatectomy.  1) SL IVF 2) Ambulate, Incentive spirometry 3) Transition to oral pain medication 4) Dulcolax suppository 5) D/C pelvic drain 6) Recheck Hgb later this AM to ensure dilutional 7) Plan for likely discharge later today   Pryor Curia. MD   LOS: 0 days   [name] 05/06/2020, 7:24 AM

## 2020-05-12 LAB — SURGICAL PATHOLOGY

## 2020-05-20 DIAGNOSIS — C61 Malignant neoplasm of prostate: Secondary | ICD-10-CM | POA: Diagnosis not present

## 2020-06-03 ENCOUNTER — Encounter: Payer: Self-pay | Admitting: Medical Oncology

## 2020-06-12 DIAGNOSIS — M62838 Other muscle spasm: Secondary | ICD-10-CM | POA: Diagnosis not present

## 2020-06-12 DIAGNOSIS — M6281 Muscle weakness (generalized): Secondary | ICD-10-CM | POA: Diagnosis not present

## 2020-06-12 DIAGNOSIS — N393 Stress incontinence (female) (male): Secondary | ICD-10-CM | POA: Diagnosis not present

## 2020-06-13 DIAGNOSIS — H6123 Impacted cerumen, bilateral: Secondary | ICD-10-CM | POA: Diagnosis not present

## 2020-06-24 DIAGNOSIS — E7800 Pure hypercholesterolemia, unspecified: Secondary | ICD-10-CM | POA: Diagnosis not present

## 2020-06-24 DIAGNOSIS — N2581 Secondary hyperparathyroidism of renal origin: Secondary | ICD-10-CM | POA: Diagnosis not present

## 2020-06-24 DIAGNOSIS — Z6827 Body mass index (BMI) 27.0-27.9, adult: Secondary | ICD-10-CM | POA: Diagnosis not present

## 2020-06-24 DIAGNOSIS — C61 Malignant neoplasm of prostate: Secondary | ICD-10-CM | POA: Diagnosis not present

## 2020-06-24 DIAGNOSIS — E1129 Type 2 diabetes mellitus with other diabetic kidney complication: Secondary | ICD-10-CM | POA: Diagnosis not present

## 2020-06-24 DIAGNOSIS — N411 Chronic prostatitis: Secondary | ICD-10-CM | POA: Diagnosis not present

## 2020-06-24 DIAGNOSIS — E038 Other specified hypothyroidism: Secondary | ICD-10-CM | POA: Diagnosis not present

## 2020-06-24 DIAGNOSIS — E063 Autoimmune thyroiditis: Secondary | ICD-10-CM | POA: Diagnosis not present

## 2020-06-24 DIAGNOSIS — E291 Testicular hypofunction: Secondary | ICD-10-CM | POA: Diagnosis not present

## 2020-06-24 DIAGNOSIS — N183 Chronic kidney disease, stage 3 unspecified: Secondary | ICD-10-CM | POA: Diagnosis not present

## 2020-07-09 DIAGNOSIS — C61 Malignant neoplasm of prostate: Secondary | ICD-10-CM | POA: Diagnosis not present

## 2020-07-16 DIAGNOSIS — E063 Autoimmune thyroiditis: Secondary | ICD-10-CM | POA: Diagnosis not present

## 2020-07-16 DIAGNOSIS — E1122 Type 2 diabetes mellitus with diabetic chronic kidney disease: Secondary | ICD-10-CM | POA: Diagnosis not present

## 2020-07-16 DIAGNOSIS — I129 Hypertensive chronic kidney disease with stage 1 through stage 4 chronic kidney disease, or unspecified chronic kidney disease: Secondary | ICD-10-CM | POA: Diagnosis not present

## 2020-07-16 DIAGNOSIS — E0580 Other thyrotoxicosis without thyrotoxic crisis or storm: Secondary | ICD-10-CM | POA: Diagnosis not present

## 2020-07-16 DIAGNOSIS — G4733 Obstructive sleep apnea (adult) (pediatric): Secondary | ICD-10-CM | POA: Diagnosis not present

## 2020-07-16 DIAGNOSIS — N1831 Chronic kidney disease, stage 3a: Secondary | ICD-10-CM | POA: Diagnosis not present

## 2020-07-16 DIAGNOSIS — E230 Hypopituitarism: Secondary | ICD-10-CM | POA: Diagnosis not present

## 2020-07-16 DIAGNOSIS — E039 Hypothyroidism, unspecified: Secondary | ICD-10-CM | POA: Diagnosis not present

## 2020-07-16 DIAGNOSIS — E559 Vitamin D deficiency, unspecified: Secondary | ICD-10-CM | POA: Diagnosis not present

## 2020-07-16 DIAGNOSIS — E782 Mixed hyperlipidemia: Secondary | ICD-10-CM | POA: Diagnosis not present

## 2020-07-16 DIAGNOSIS — C61 Malignant neoplasm of prostate: Secondary | ICD-10-CM | POA: Diagnosis not present

## 2020-07-16 DIAGNOSIS — E790 Hyperuricemia without signs of inflammatory arthritis and tophaceous disease: Secondary | ICD-10-CM | POA: Diagnosis not present

## 2020-08-01 DIAGNOSIS — R350 Frequency of micturition: Secondary | ICD-10-CM | POA: Diagnosis not present

## 2020-08-01 DIAGNOSIS — C61 Malignant neoplasm of prostate: Secondary | ICD-10-CM | POA: Diagnosis not present

## 2020-10-04 ENCOUNTER — Emergency Department (HOSPITAL_BASED_OUTPATIENT_CLINIC_OR_DEPARTMENT_OTHER)
Admission: EM | Admit: 2020-10-04 | Discharge: 2020-10-04 | Disposition: A | Discharge status: Home / Self Care | Payer: 59 | Attending: Emergency Medicine | Admitting: Emergency Medicine

## 2020-10-04 ENCOUNTER — Other Ambulatory Visit: Payer: Self-pay

## 2020-10-04 ENCOUNTER — Encounter (HOSPITAL_BASED_OUTPATIENT_CLINIC_OR_DEPARTMENT_OTHER): Payer: Self-pay | Admitting: Emergency Medicine

## 2020-10-04 DIAGNOSIS — S6991XA Unspecified injury of right wrist, hand and finger(s), initial encounter: Secondary | ICD-10-CM | POA: Insufficient documentation

## 2020-10-04 DIAGNOSIS — Z87891 Personal history of nicotine dependence: Secondary | ICD-10-CM | POA: Diagnosis not present

## 2020-10-04 DIAGNOSIS — Z7984 Long term (current) use of oral hypoglycemic drugs: Secondary | ICD-10-CM | POA: Diagnosis not present

## 2020-10-04 DIAGNOSIS — E039 Hypothyroidism, unspecified: Secondary | ICD-10-CM | POA: Diagnosis not present

## 2020-10-04 DIAGNOSIS — Z8546 Personal history of malignant neoplasm of prostate: Secondary | ICD-10-CM | POA: Diagnosis not present

## 2020-10-04 DIAGNOSIS — Z79899 Other long term (current) drug therapy: Secondary | ICD-10-CM | POA: Insufficient documentation

## 2020-10-04 DIAGNOSIS — Z23 Encounter for immunization: Secondary | ICD-10-CM | POA: Insufficient documentation

## 2020-10-04 DIAGNOSIS — W268XXA Contact with other sharp object(s), not elsewhere classified, initial encounter: Secondary | ICD-10-CM | POA: Diagnosis not present

## 2020-10-04 DIAGNOSIS — E119 Type 2 diabetes mellitus without complications: Secondary | ICD-10-CM | POA: Diagnosis not present

## 2020-10-04 DIAGNOSIS — I10 Essential (primary) hypertension: Secondary | ICD-10-CM | POA: Insufficient documentation

## 2020-10-04 MED ORDER — BACITRACIN ZINC 500 UNIT/GM EX OINT
TOPICAL_OINTMENT | Freq: Two times a day (BID) | CUTANEOUS | Status: DC
  Administered 2020-10-04: 1 via TOPICAL
  Filled 2020-10-04: qty 28.35

## 2020-10-04 MED ORDER — TETANUS-DIPHTH-ACELL PERTUSSIS 5-2.5-18.5 LF-MCG/0.5 IM SUSY
0.5000 mL | PREFILLED_SYRINGE | Freq: Once | INTRAMUSCULAR | Status: AC
  Administered 2020-10-04: 0.5 mL via INTRAMUSCULAR
  Filled 2020-10-04: qty 0.5

## 2020-10-04 MED ORDER — BACITRACIN ZINC 500 UNIT/GM EX OINT: 1.0000 "application " | TOPICAL_OINTMENT | Freq: Two times a day (BID) | CUTANEOUS | 0 refills | Status: AC

## 2020-10-04 NOTE — ED Triage Notes (Signed)
4th finger right hand cut on metal rod. Needs tetanus shot.

## 2020-10-04 NOTE — ED Notes (Addendum)
States placing metal sign into ground and cut top of 4th finger right hand.  Small avulsion noted to finger; not bleeding.  States here primary to get tetans shot.

## 2020-10-04 NOTE — ED Provider Notes (Signed)
[facility] EMERGENCY DEPT Provider Note   CSN: [id number] Arrival date & time: 10/04/20  1105     History Chief Complaint  Patient presents with  . Finger Injury    [name] is a 75 y.o. male.  HPI    75 year old male comes in with chief complaint of finger injury.  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  The metallic rod that sliced his finger off did have rust on it.  He is not up-to-date with his tetanus.  He did wash his finger with alcohol and soap before coming into the ER.  No numbness, tingling.  Past Medical History:  Diagnosis Date  . Allergy   . Arthritis   . Diabetes mellitus ([place])   . GERD (gastroesophageal reflux disease)   . Gout   . HOH (hard of hearing)   . Hx of skin cancer, basal cell   . Hyperlipemia   . Hypertension   . Hypothyroidism   . Obstructive sleep apnea 11/07/2015  . Pneumonia   . Prostate cancer ([place])   . Renal disorder    mild renal insufficiency  . Rupture quadriceps tendon 09-21-13   left  . Seizures ([place]) 2000   only one time-never dx why-no meds  . Skin cancer   . Sleep apnea   . Thyroid disease    hypothyroidism  . Wears glasses     Patient Active Problem List   Diagnosis Date Noted  . Prostate cancer ([place]) 05/05/2020  . Malignant neoplasm of prostate ([place]) 03/17/2020  . Seasonal and perennial allergic rhinitis 03/19/2016  . Obstructive sleep apnea 11/07/2015  . Wears glasses   . HOH (hard of hearing)   . Thyroid disease   . GERD (gastroesophageal reflux disease)   . Diabetes mellitus ([place])   . Hypertension   . Hyperlipemia   . Rupture quadriceps tendon 09/21/2013    Past Surgical History:  Procedure Laterality Date  . COLONOSCOPY    . [place]  . KNEE ARTHROSCOPY Left 09/26/2013   Procedure: LEFT KNEE ARTHROSCOPY WITH DEBRIDEMENT/SHAVING (CHONDROPLASTY), SUTURE  REPAIR QUADRICEPS/HAMSTRING MUSCLE RUPTURE PRIMARY;  Surgeon: Lorn Junes, MD;  Location: [facility];  Service: Orthopedics;  Laterality: Left;  . KNEE SURGERY     left  . LYMPHADENECTOMY Bilateral 05/05/2020   Procedure: LYMPHADENECTOMY, PELVIC;  Surgeon: Raynelle Bring, MD;  Location: [facility];  Service: Urology;  Laterality: Bilateral;  . PROSTATE BIOPSY    . QUADRICEPS TENDON REPAIR Left 09/26/2013   Procedure: REPAIR QUADRICEP TENDON;  Surgeon: Lorn Junes, MD;  Location: [facility];  Service: Orthopedics;  Laterality: Left;  . ROBOT ASSISTED LAPAROSCOPIC RADICAL PROSTATECTOMY N/A 05/05/2020   Procedure: XI ROBOTIC ASSISTED LAPAROSCOPIC RADICAL PROSTATECTOMY LEVEL 2;  Surgeon: Raynelle Bring, MD;  Location: [facility];  Service: Urology;  Laterality: N/A;  . TONSILLECTOMY         Family History  Problem Relation Age of Onset  . Hypertension Other   . Diabetes Other   . Breast cancer Mother   . Breast cancer Sister   . Esophageal cancer Neg Hx   . Rectal cancer Neg Hx   . Stomach cancer Neg Hx   . Prostate cancer Neg Hx   . Pancreatic cancer Neg Hx   . Colon cancer Neg Hx     Social History   Tobacco  Use  . Smoking status: Former Smoker    Packs/day: 0.50    Years: 19.00    Pack years: 9.50    Types: Cigarettes    Quit date: 09/21/1972    Years since quitting: 48.0  . Smokeless tobacco: Never Used  Vaping Use  . Vaping Use: Never used  Substance Use Topics  . Alcohol use: No    Alcohol/week: 14.0 standard drinks    Types: 7 Glasses of wine, 7 Shots of liquor per week    Comment: no longer drinks  . Drug use: No    Home Medications Prior to Admission medications   Medication Sig Start Date End Date Taking? Authorizing Provider  Accu-Chek FastClix Lancets MISC Apply topically. 12/24/19  Yes [provider]  albuterol (VENTOLIN HFA) 108 (90 Base) MCG/ACT inhaler Inhale 2 puffs into the lungs every 6 (six) hours as needed for  wheezing or shortness of breath.  06/28/19  Yes [provider]  allopurinol (ZYLOPRIM) 300 MG tablet Take 300 mg by mouth daily.    Yes [provider]  atorvastatin (LIPITOR) 80 MG tablet Take 80 mg by mouth daily.   Yes [provider]  bacitracin ointment Apply 1 application topically 2 (two) times daily. 10/04/20  Yes Varney Biles, MD  Blood Glucose Monitoring Suppl (FIFTY50 GLUCOSE METER 2.0) w/Device KIT See admin instructions. 05/17/19  Yes [provider]  Cholecalciferol 1.25 MG (50000 UT) TABS Take 50,000 Units by mouth once a week.    Yes [provider]  ezetimibe (ZETIA) 10 MG tablet Take 10 mg by mouth daily.   Yes [provider]  FORTESTA 10 MG/ACT (2%) GEL 3 application daily. 01/22/16  Yes [provider]  FREESTYLE LITE test strip  12/24/19  Yes [provider]  hydrOXYzine (ATARAX/VISTARIL) 25 MG tablet Take 25 mg by mouth at bedtime. 04/18/20  Yes [provider]  ketoconazole (NIZORAL) 2 % cream Apply 1 application topically daily as needed for irritation.  02/06/20  Yes [provider]  simethicone (MYLICON) 022 MG chewable tablet Chew 125 mg by mouth every 6 (six) hours as needed for flatulence.   Yes [provider]  sitaGLIPtan-metformin (JANUMET) 50-500 MG per tablet Take 2 tablets by mouth daily.    Yes [provider]  telmisartan-hydrochlorothiazide (MICARDIS HCT) 40-12.5 MG per tablet Take 0.25 tablets by mouth daily.   Yes [provider]  triamcinolone (KENALOG) 0.1 % Apply 1 application topically every other day. 04/18/20  Yes [provider]  UNIFINE PENTIPS 32G X 4 MM MISC  03/06/20  Yes [provider]  VASCEPA 1 g capsule Take 2 g by mouth at bedtime.  12/31/19  Yes [provider]  VICTOZA 18 MG/3ML SOPN Inject 1.2 mg into the skin daily.  03/06/20  Yes [provider]  acetaminophen (TYLENOL) 500 MG tablet Take  1,000 mg by mouth at bedtime.     [provider]  azelastine (ASTELIN) 0.1 % nasal spray 1-2 puffs each nostril once or twice daily as needed Patient taking differently: Place 2 sprays into both nostrils daily as needed for rhinitis. 03/19/16   Baird Lyons D, MD  calcium carbonate (TUMS EX) 750 MG chewable tablet Chew 2 tablets by mouth at bedtime.     [provider]  levothyroxine (SYNTHROID, LEVOTHROID) 137 MCG tablet Take 137 mcg by mouth daily.    [provider]  minoxidil (ROGAINE) 2 % external solution Apply 1 application topically daily.  [provider]  RABEprazole (ACIPHEX) 20 MG tablet Take 20 mg by mouth in the morning and at bedtime.     [provider]  sulfamethoxazole-trimethoprim (BACTRIM DS) 800-160 MG tablet Take 1 tablet by mouth 2 (two) times daily. Begin the day prior to postoperative appointment for catheter removal. 05/05/20   Raynelle Bring, MD  tadalafil (CIALIS) 20 MG tablet Take 20 mg by mouth daily as needed for erectile dysfunction.  12/31/19   [provider]  traMADol (ULTRAM) 50 MG tablet Take 1-2 tablets (50-100 mg total) by mouth every 6 (six) hours as needed (pain). 05/05/20   Raynelle Bring, MD    Allergies    Lisinopril, Nsaids, and Ranitidine hcl  Review of Systems   Review of Systems  Constitutional: Negative for activity change.  Skin: Positive for wound.  Allergic/Immunologic:       Not updated with tetanus    Physical Exam Updated Vital Signs BP 113/76   Pulse 82   Temp 98.6 F (37 C) (Oral)   Resp 20   SpO2 100%   Physical Exam Vitals and nursing note reviewed.  Constitutional:      Appearance: He is well-developed.  HENT:     Head: Atraumatic.  Pulmonary:     Effort: Pulmonary effort is normal.  Musculoskeletal:        General: No swelling or deformity.  Skin:    General: Skin is warm.     Comments: Injury to the distal end of the right ring finger.  The epidermis has  been shaved off without any true amputation.  Range of motion over the DIP is intact.  Neurological:     Mental Status: He is alert and oriented to person, place, and time.     ED Results / Procedures / Treatments   Labs (all labs ordered are listed, but only abnormal results are displayed) Labs Reviewed - No data to display  EKG None  Radiology No results found.  Procedures Procedures   Medications Ordered in ED Medications  bacitracin ointment (1 application Topical Given 10/04/20 1154)  Tdap (BOOSTRIX) injection 0.5 mL (0.5 mLs Intramuscular Given 10/04/20 1154)    ED Course  I have reviewed the triage vital signs and the nursing notes.  Pertinent labs & imaging results that were available during my care of the patient were reviewed by me and considered in my medical decision making (see chart for details).    MDM Rules/Calculators/A&P                          75 year old male comes in with chief complaint of finger injury.  Unfortunately, the injury was with a rusted rod and patient is not up-to-date with his immunization.  Tetanus updated here.  Bacitracin ointment applied and prescription given for outpatient management of the wound.  Strict ER return precautions discussed, Dr. Owens Shark will return to the ER if he starts having increased swelling, pain, redness to the finger or hand.  Final Clinical Impression(s) / ED Diagnoses Final diagnoses:  Finger injury, right, initial encounter    Rx / DC Orders ED Discharge Orders         Ordered    bacitracin ointment  2 times daily        10/04/20 1227           Varney Biles, MD 10/04/20 1233

## 2020-10-04 NOTE — Discharge Instructions (Addendum)
We saw you in the ER for your finger wound.  Please read the instructions provided on wound care. Keep the area clean and dry, apply bacitracin ointment daily. RETURN TO THE ER IF THERE IS INCREASED PAIN, REDNESS, PUS COMING OUT from the wound site.

## 2020-10-22 DIAGNOSIS — C61 Malignant neoplasm of prostate: Secondary | ICD-10-CM | POA: Diagnosis not present

## 2020-10-22 DIAGNOSIS — N1831 Chronic kidney disease, stage 3a: Secondary | ICD-10-CM | POA: Diagnosis not present

## 2020-10-22 DIAGNOSIS — E063 Autoimmune thyroiditis: Secondary | ICD-10-CM | POA: Diagnosis not present

## 2020-10-22 DIAGNOSIS — G4733 Obstructive sleep apnea (adult) (pediatric): Secondary | ICD-10-CM | POA: Diagnosis not present

## 2020-10-22 DIAGNOSIS — E559 Vitamin D deficiency, unspecified: Secondary | ICD-10-CM | POA: Diagnosis not present

## 2020-10-22 DIAGNOSIS — E230 Hypopituitarism: Secondary | ICD-10-CM | POA: Diagnosis not present

## 2020-10-22 DIAGNOSIS — E039 Hypothyroidism, unspecified: Secondary | ICD-10-CM | POA: Diagnosis not present

## 2020-10-22 DIAGNOSIS — M4722 Other spondylosis with radiculopathy, cervical region: Secondary | ICD-10-CM | POA: Diagnosis not present

## 2020-10-22 DIAGNOSIS — E790 Hyperuricemia without signs of inflammatory arthritis and tophaceous disease: Secondary | ICD-10-CM | POA: Diagnosis not present

## 2020-10-22 DIAGNOSIS — E1122 Type 2 diabetes mellitus with diabetic chronic kidney disease: Secondary | ICD-10-CM | POA: Diagnosis not present

## 2020-10-22 DIAGNOSIS — I129 Hypertensive chronic kidney disease with stage 1 through stage 4 chronic kidney disease, or unspecified chronic kidney disease: Secondary | ICD-10-CM | POA: Diagnosis not present

## 2020-10-22 DIAGNOSIS — E782 Mixed hyperlipidemia: Secondary | ICD-10-CM | POA: Diagnosis not present

## 2020-10-22 DIAGNOSIS — L299 Pruritus, unspecified: Secondary | ICD-10-CM | POA: Diagnosis not present

## 2020-10-24 ENCOUNTER — Other Ambulatory Visit (HOSPITAL_COMMUNITY): Payer: Self-pay

## 2020-10-24 MED ORDER — CARESTART COVID-19 HOME TEST VI KIT
PACK | 0 refills | Status: DC
  Filled 2020-10-24 (×2): qty 4, 4d supply, fill #0

## 2020-10-27 ENCOUNTER — Other Ambulatory Visit (HOSPITAL_COMMUNITY): Payer: Self-pay

## 2020-11-04 ENCOUNTER — Other Ambulatory Visit (HOSPITAL_COMMUNITY): Payer: Self-pay

## 2020-11-11 ENCOUNTER — Other Ambulatory Visit (HOSPITAL_COMMUNITY): Payer: Self-pay

## 2020-11-14 DIAGNOSIS — C61 Malignant neoplasm of prostate: Secondary | ICD-10-CM | POA: Diagnosis not present

## 2020-11-14 DIAGNOSIS — M4722 Other spondylosis with radiculopathy, cervical region: Secondary | ICD-10-CM | POA: Diagnosis not present

## 2020-11-14 DIAGNOSIS — M542 Cervicalgia: Secondary | ICD-10-CM | POA: Diagnosis not present

## 2020-11-14 DIAGNOSIS — E291 Testicular hypofunction: Secondary | ICD-10-CM | POA: Diagnosis not present

## 2020-11-21 ENCOUNTER — Other Ambulatory Visit (HOSPITAL_COMMUNITY): Payer: Self-pay

## 2020-11-25 ENCOUNTER — Other Ambulatory Visit (HOSPITAL_COMMUNITY): Payer: Self-pay

## 2020-11-26 ENCOUNTER — Encounter (HOSPITAL_BASED_OUTPATIENT_CLINIC_OR_DEPARTMENT_OTHER): Payer: Self-pay | Admitting: Obstetrics and Gynecology

## 2020-11-26 ENCOUNTER — Other Ambulatory Visit: Payer: Self-pay

## 2020-11-26 ENCOUNTER — Emergency Department (HOSPITAL_BASED_OUTPATIENT_CLINIC_OR_DEPARTMENT_OTHER)
Admission: EM | Admit: 2020-11-26 | Discharge: 2020-11-26 | Disposition: A | Discharge status: Home / Self Care | Payer: 59 | Attending: Emergency Medicine | Admitting: Emergency Medicine

## 2020-11-26 DIAGNOSIS — Z87891 Personal history of nicotine dependence: Secondary | ICD-10-CM | POA: Diagnosis not present

## 2020-11-26 DIAGNOSIS — I10 Essential (primary) hypertension: Secondary | ICD-10-CM | POA: Diagnosis not present

## 2020-11-26 DIAGNOSIS — Z8546 Personal history of malignant neoplasm of prostate: Secondary | ICD-10-CM | POA: Diagnosis not present

## 2020-11-26 DIAGNOSIS — Z20822 Contact with and (suspected) exposure to covid-19: Secondary | ICD-10-CM | POA: Insufficient documentation

## 2020-11-26 DIAGNOSIS — Z7984 Long term (current) use of oral hypoglycemic drugs: Secondary | ICD-10-CM | POA: Diagnosis not present

## 2020-11-26 DIAGNOSIS — Z85828 Personal history of other malignant neoplasm of skin: Secondary | ICD-10-CM | POA: Insufficient documentation

## 2020-11-26 DIAGNOSIS — R112 Nausea with vomiting, unspecified: Secondary | ICD-10-CM | POA: Diagnosis not present

## 2020-11-26 DIAGNOSIS — E039 Hypothyroidism, unspecified: Secondary | ICD-10-CM | POA: Insufficient documentation

## 2020-11-26 DIAGNOSIS — E119 Type 2 diabetes mellitus without complications: Secondary | ICD-10-CM | POA: Insufficient documentation

## 2020-11-26 DIAGNOSIS — R5383 Other fatigue: Secondary | ICD-10-CM | POA: Insufficient documentation

## 2020-11-26 DIAGNOSIS — Z79899 Other long term (current) drug therapy: Secondary | ICD-10-CM | POA: Diagnosis not present

## 2020-11-26 DIAGNOSIS — J029 Acute pharyngitis, unspecified: Secondary | ICD-10-CM | POA: Insufficient documentation

## 2020-11-26 LAB — RESP PANEL BY RT-PCR (FLU A&B, COVID) ARPGX2
Influenza A by PCR: NEGATIVE
Influenza B by PCR: NEGATIVE
SARS Coronavirus 2 by RT PCR: NEGATIVE

## 2020-11-26 MED ORDER — BENZONATATE 100 MG PO CAPS: 100.0000 mg | ORAL_CAPSULE | Freq: Three times a day (TID) | ORAL | 0 refills | Status: DC

## 2020-11-26 MED ORDER — ONDANSETRON 4 MG PO TBDP: ORAL_TABLET | ORAL | 0 refills | Status: DC

## 2020-11-26 NOTE — ED Triage Notes (Signed)
Patient reports to the ER for being COVID+. Patient reports fatigue, sore throat, and runny nose.

## 2020-11-26 NOTE — ED Provider Notes (Signed)
[facility] Provider Note   CSN: [id number] Arrival date & time: 11/26/20  2044     History Chief Complaint  Patient presents with   Covid Positive    [name] is a 75 y.o. male.  75 yo M with a chief complaints of concern for having COVID.  The patient's wife is tested positive and is having more severe symptoms.  He has felt a bit fatigued for the past day or so denies any fevers denies cough or congestion denies nausea or vomiting.  Has a bit of a sore throat and a runny nose.  Was recently at a [employer] reunion where multiple people have tested positive for the coronavirus.  The history is provided by the patient and the spouse.  Illness Severity:  Moderate Onset quality:  Gradual Duration:  2 days Timing:  Constant Progression:  Unchanged Chronicity:  New Associated symptoms: rhinorrhea and sore throat   Associated symptoms: no abdominal pain, no chest pain, no congestion, no diarrhea, no fever, no headaches, no myalgias, no rash, no shortness of breath and no vomiting       Past Medical History:  Diagnosis Date   Allergy    Arthritis    Diabetes mellitus (HCC)    GERD (gastroesophageal reflux disease)    Gout    HOH (hard of hearing)    Hx of skin cancer, basal cell    Hyperlipemia    Hypertension    Hypothyroidism    Obstructive sleep apnea 11/07/2015   Pneumonia    Prostate cancer (HCC)    Renal disorder    mild renal insufficiency   Rupture quadriceps tendon 09-21-13   left   Seizures ([place]) 2000   only one time-never dx why-no meds   Skin cancer    Sleep apnea    Thyroid disease    hypothyroidism   Wears glasses     Patient Active Problem List   Diagnosis Date Noted   Prostate cancer ([place]) 05/05/2020   Malignant neoplasm of prostate ([place]) 03/17/2020   Seasonal and perennial allergic rhinitis 03/19/2016   Obstructive sleep apnea 11/07/2015   Wears glasses    HOH (hard of hearing)    Thyroid disease    GERD  (gastroesophageal reflux disease)    Diabetes mellitus (HCC)    Hypertension    Hyperlipemia    Rupture quadriceps tendon 09/21/2013    Past Surgical History:  Procedure Laterality Date   COLONOSCOPY     FACIAL FRACTURE SURGERY  1965   KNEE ARTHROSCOPY Left 09/26/2013   Procedure: LEFT KNEE ARTHROSCOPY WITH DEBRIDEMENT/SHAVING (CHONDROPLASTY), SUTURE REPAIR QUADRICEPS/HAMSTRING MUSCLE RUPTURE PRIMARY;  Surgeon: Lorn Junes, MD;  Location: [facility];  Service: Orthopedics;  Laterality: Left;   KNEE SURGERY     left   LYMPHADENECTOMY Bilateral 05/05/2020   Procedure: LYMPHADENECTOMY, PELVIC;  Surgeon: Raynelle Bring, MD;  Location: [facility];  Service: Urology;  Laterality: Bilateral;   PROSTATE BIOPSY     QUADRICEPS TENDON REPAIR Left 09/26/2013   Procedure: REPAIR QUADRICEP TENDON;  Surgeon: Lorn Junes, MD;  Location: [facility];  Service: Orthopedics;  Laterality: Left;   ROBOT ASSISTED LAPAROSCOPIC RADICAL PROSTATECTOMY N/A 05/05/2020   Procedure: XI ROBOTIC ASSISTED LAPAROSCOPIC RADICAL PROSTATECTOMY LEVEL 2;  Surgeon: Raynelle Bring, MD;  Location: [facility];  Service: Urology;  Laterality: N/A;   TONSILLECTOMY         Family History  Problem Relation Age of Onset   Hypertension Other  Diabetes Other    Breast cancer Mother    Breast cancer Sister    Esophageal cancer Neg Hx    Rectal cancer Neg Hx    Stomach cancer Neg Hx    Prostate cancer Neg Hx    Pancreatic cancer Neg Hx    Colon cancer Neg Hx     Social History   Tobacco Use   Smoking status: Former    Packs/day: 0.50    Years: 19.00    Pack years: 9.50    Types: Cigarettes    Quit date: 09/21/1972    Years since quitting: 48.2   Smokeless tobacco: Never  Vaping Use   Vaping Use: Never used  Substance Use Topics   Alcohol use: No    Alcohol/week: 14.0 standard drinks    Types: 7 Glasses of wine, 7 Shots of liquor per week    Comment: no longer drinks   Drug use: No     Home Medications Prior to Admission medications   Medication Sig Start Date End Date Taking? Authorizing Provider  benzonatate (TESSALON) 100 MG capsule Take 1 capsule (100 mg total) by mouth every 8 (eight) hours. 11/26/20  Yes Deno Etienne, DO  ondansetron (ZOFRAN ODT) 4 MG disintegrating tablet 38m ODT q4 hours prn nausea/vomit 11/26/20  Yes FDeno Etienne DO  Accu-Chek FastClix Lancets MISC Apply topically. 12/24/19   [provider]  acetaminophen (TYLENOL) 500 MG tablet Take 1,000 mg by mouth at bedtime.     [provider]  albuterol (VENTOLIN HFA) 108 (90 Base) MCG/ACT inhaler Inhale 2 puffs into the lungs every 6 (six) hours as needed for wheezing or shortness of breath.  06/28/19   [provider]  allopurinol (ZYLOPRIM) 300 MG tablet Take 300 mg by mouth daily.     [provider]  atorvastatin (LIPITOR) 80 MG tablet Take 80 mg by mouth daily.    [provider]  azelastine (ASTELIN) 0.1 % nasal spray 1-2 puffs each nostril once or twice daily as needed Patient taking differently: Place 2 sprays into both nostrils daily as needed for rhinitis. 03/19/16   YBaird LyonsD, MD  bacitracin ointment Apply 1 application topically 2 (two) times daily. 10/04/20   NVarney Biles MD  Blood Glucose Monitoring Suppl (FIFTY50 GLUCOSE METER 2.0) w/Device KIT See admin instructions. 05/17/19   [provider]  calcium carbonate (TUMS EX) 750 MG chewable tablet Chew 2 tablets by mouth at bedtime.     [provider]  Cholecalciferol 1.25 MG (50000 UT) TABS Take 50,000 Units by mouth once a week.     [provider]  COVID-19 At Home Antigen Test ([facility]) KIT Use as directed within package instructions. 10/24/20   FJefm Bryant RPH  ezetimibe (ZETIA) 10 MG tablet Take 10 mg by mouth daily.    [provider]  FORTESTA 10 MG/ACT (2%) GEL 3 application daily. 01/22/16   [provider]  FREESTYLE  LITE test strip  12/24/19   [provider]  hydrOXYzine (ATARAX/VISTARIL) 25 MG tablet Take 25 mg by mouth at bedtime. 04/18/20   [provider]  ketoconazole (NIZORAL) 2 % cream Apply 1 application topically daily as needed for irritation.  02/06/20   [provider]  levothyroxine (SYNTHROID, LEVOTHROID) 137 MCG tablet Take 137 mcg by mouth daily.    [provider]  minoxidil (ROGAINE) 2 % external solution Apply 1 application topically daily.     [provider]  RABEprazole (ACIPHEX) 20 MG tablet Take 20 mg by mouth in the morning and at bedtime.     [provider]  simethicone (MYLICON) 940 MG chewable tablet Chew 125 mg by mouth every 6 (six) hours as needed for flatulence.    [provider]  sitaGLIPtan-metformin (JANUMET) 50-500 MG per tablet Take 2 tablets by mouth daily.     [provider]  sulfamethoxazole-trimethoprim (BACTRIM DS) 800-160 MG tablet Take 1 tablet by mouth 2 (two) times daily. Begin the day prior to postoperative appointment for catheter removal. 05/05/20   Raynelle Bring, MD  tadalafil (CIALIS) 20 MG tablet Take 20 mg by mouth daily as needed for erectile dysfunction.  12/31/19   [provider]  telmisartan-hydrochlorothiazide (MICARDIS HCT) 40-12.5 MG per tablet Take 0.25 tablets by mouth daily.    [provider]  traMADol (ULTRAM) 50 MG tablet Take 1-2 tablets (50-100 mg total) by mouth every 6 (six) hours as needed (pain). 05/05/20   Raynelle Bring, MD  triamcinolone (KENALOG) 0.1 % Apply 1 application topically every other day. 04/18/20   [provider]  UNIFINE PENTIPS 32G X 4 MM MISC  03/06/20   [provider]  VASCEPA 1 g capsule Take 2 g by mouth at bedtime.  12/31/19   [provider]  VICTOZA 18 MG/3ML SOPN Inject 1.2 mg into the skin daily.  03/06/20   [provider]    Allergies    Lisinopril, Nsaids, and Ranitidine hcl  Review  of Systems   Review of Systems  Constitutional:  Negative for chills and fever.  HENT:  Positive for rhinorrhea and sore throat. Negative for congestion and facial swelling.   Eyes:  Negative for discharge and visual disturbance.  Respiratory:  Negative for shortness of breath.   Cardiovascular:  Negative for chest pain and palpitations.  Gastrointestinal:  Negative for abdominal pain, diarrhea and vomiting.  Musculoskeletal:  Negative for arthralgias and myalgias.  Skin:  Negative for color change and rash.  Neurological:  Negative for tremors, syncope and headaches.  Psychiatric/Behavioral:  Negative for confusion and dysphoric mood.    Physical Exam Updated Vital Signs BP 127/71 (BP Location: Right Arm)   Pulse 80   Temp 98.9 F (37.2 C) (Oral)   Resp 18   SpO2 96%   Physical Exam Vitals and nursing note reviewed.  Constitutional:      Appearance: He is well-developed.  HENT:     Head: Normocephalic and atraumatic.  Eyes:     Pupils: Pupils are equal, round, and reactive to light.  Neck:     Vascular: No JVD.  Pulmonary:     Effort: Pulmonary effort is normal.  Musculoskeletal:        General: Normal range of motion.     Cervical back: Normal range of motion and neck supple.  Skin:    Coloration: Skin is not pale.     Findings: No rash.  Neurological:     Mental Status: He is alert and oriented to person, place, and time.  Psychiatric:        Behavior: Behavior normal.    ED Results / Procedures / Treatments   Labs (all labs ordered are listed, but only abnormal results are displayed) Labs Reviewed  RESP PANEL BY RT-PCR (FLU A&B, COVID) ARPGX2    EKG None  Radiology No results found.  Procedures Procedures   Medications Ordered in ED Medications - No data to display  ED Course  I have reviewed  the triage vital signs and the nursing notes.  Pertinent labs & imaging results that were available during my care of the patient were reviewed by me and  considered in my medical decision making (see chart for details).    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  Patient likely has the disease based on his presentation.  I did offer to treat him with a monoclonal antibody infusion or an oral medication which he is declining at this time.  We will follow-up with his family doctor.  11:01 PM:  I have discussed the diagnosis/risks/treatment options with the patient and family and believe the pt to be eligible for discharge home to follow-up with PCP. We also discussed returning to the ED immediately if new or worsening sx occur. We discussed the sx which are most concerning (e.g., sudden worsening pain, fever, inability to tolerate by mouth) that necessitate immediate return. Medications administered to the patient during their visit and any new prescriptions provided to the patient are listed below.  Medications given during this visit Medications - No data to display   The patient appears reasonably screen and/or stabilized for discharge and I doubt any other medical condition or other [facility] requiring further screening, evaluation, or treatment in the ED at this time prior to discharge.    Final Clinical Impression(s) / ED Diagnoses Final diagnoses:  Contact with and (suspected) exposure to covid-19    Rx / DC Orders ED Discharge Orders          Ordered    ondansetron (ZOFRAN ODT) 4 MG disintegrating tablet        11/26/20 2250    benzonatate (TESSALON) 100 MG capsule  Every 8 hours        11/26/20 [place], [place], DO 11/26/20 2302

## 2020-11-26 NOTE — Discharge Instructions (Addendum)
Take tylenol 2 pills 4 times a day and motrin 4 pills 3 times a day.  Drink plenty of fluids.  Return for worsening shortness of breath, headache, confusion. Follow up with your family doctor.   

## 2020-11-28 DIAGNOSIS — M542 Cervicalgia: Secondary | ICD-10-CM | POA: Diagnosis not present

## 2020-11-28 DIAGNOSIS — E291 Testicular hypofunction: Secondary | ICD-10-CM | POA: Diagnosis not present

## 2020-11-28 DIAGNOSIS — N5201 Erectile dysfunction due to arterial insufficiency: Secondary | ICD-10-CM | POA: Diagnosis not present

## 2020-11-28 DIAGNOSIS — C61 Malignant neoplasm of prostate: Secondary | ICD-10-CM | POA: Diagnosis not present

## 2020-11-28 DIAGNOSIS — N3941 Urge incontinence: Secondary | ICD-10-CM | POA: Diagnosis not present

## 2020-11-28 DIAGNOSIS — M4722 Other spondylosis with radiculopathy, cervical region: Secondary | ICD-10-CM | POA: Diagnosis not present

## 2020-12-01 ENCOUNTER — Other Ambulatory Visit (HOSPITAL_COMMUNITY): Payer: Self-pay

## 2020-12-01 MED ORDER — CARESTART COVID-19 HOME TEST VI KIT
PACK | 0 refills | Status: DC
  Filled 2020-12-01: qty 4, 4d supply, fill #0

## 2020-12-11 ENCOUNTER — Other Ambulatory Visit (HOSPITAL_COMMUNITY): Payer: Self-pay

## 2020-12-11 MED ORDER — CARESTART COVID-19 HOME TEST VI KIT
PACK | 0 refills | Status: DC
  Filled 2020-12-11: qty 4, 4d supply, fill #0

## 2020-12-12 ENCOUNTER — Telehealth: Payer: Self-pay | Admitting: Endocrinology

## 2020-12-12 DIAGNOSIS — M542 Cervicalgia: Secondary | ICD-10-CM | POA: Diagnosis not present

## 2020-12-12 DIAGNOSIS — M4722 Other spondylosis with radiculopathy, cervical region: Secondary | ICD-10-CM | POA: Diagnosis not present

## 2020-12-12 NOTE — Telephone Encounter (Signed)
LVM to CB, have opening for CPE on Friday 02/13/2021 at 2:00 PM

## 2020-12-12 NOTE — Telephone Encounter (Signed)
Patient called back, rescheduled CPE

## 2021-01-08 ENCOUNTER — Encounter: Payer: Self-pay | Admitting: Internal Medicine

## 2021-01-08 ENCOUNTER — Other Ambulatory Visit: Payer: Self-pay

## 2021-01-08 ENCOUNTER — Ambulatory Visit (INDEPENDENT_AMBULATORY_CARE_PROVIDER_SITE_OTHER): Payer: 59 | Admitting: Internal Medicine

## 2021-01-08 VITALS — BP 120/78 | HR 74 | Ht 65.0 in | Wt 180.0 lb

## 2021-01-08 DIAGNOSIS — E559 Vitamin D deficiency, unspecified: Secondary | ICD-10-CM | POA: Diagnosis not present

## 2021-01-08 DIAGNOSIS — E1122 Type 2 diabetes mellitus with diabetic chronic kidney disease: Secondary | ICD-10-CM

## 2021-01-08 DIAGNOSIS — E063 Autoimmune thyroiditis: Secondary | ICD-10-CM | POA: Diagnosis not present

## 2021-01-08 DIAGNOSIS — E1136 Type 2 diabetes mellitus with diabetic cataract: Secondary | ICD-10-CM | POA: Diagnosis not present

## 2021-01-08 DIAGNOSIS — E038 Other specified hypothyroidism: Secondary | ICD-10-CM | POA: Diagnosis not present

## 2021-01-08 DIAGNOSIS — E7849 Other hyperlipidemia: Secondary | ICD-10-CM

## 2021-01-08 DIAGNOSIS — N183 Chronic kidney disease, stage 3 unspecified: Secondary | ICD-10-CM | POA: Diagnosis not present

## 2021-01-08 LAB — POCT GLYCOSYLATED HEMOGLOBIN (HGB A1C): Hemoglobin A1C: 6 % — AB (ref 4.0–5.6)

## 2021-01-08 NOTE — Patient Instructions (Addendum)
Please continue: - JanuMet 50-500 mg 2x a day, with meals - Victoza 1.2-1.8 mg before b'fast  Check sugars 1x a day, rotating check times.  Please return in 3 to 4 months.  PATIENT INSTRUCTIONS FOR TYPE 2 DIABETES:  DIET AND EXERCISE Diet and exercise is an important part of diabetic treatment.  We recommended aerobic exercise in the form of brisk walking (working between 40-60% of maximal aerobic capacity, similar to brisk walking) for 150 minutes per week (such as 30 minutes five days per week) along with 3 times per week performing 'resistance' training (using various gauge rubber tubes with handles) 5-10 exercises involving the major muscle groups (upper body, lower body and core) performing 10-15 repetitions (or near fatigue) each exercise. Start at half the above goal but build slowly to reach the above goals. If limited by weight, joint pain, or disability, we recommend daily walking in a swimming pool with water up to waist to reduce pressure from joints while allow for adequate exercise.    BLOOD GLUCOSES Monitoring your blood glucoses is important for continued management of your diabetes. Please check your blood glucoses 2-4 times a day: fasting, before meals and at bedtime (you can rotate these measurements - e.g. one day check before the 3 meals, the next day check before 2 of the meals and before bedtime, etc.).   HYPOGLYCEMIA (low blood sugar) Hypoglycemia is usually a reaction to not eating, exercising, or taking too much insulin/ other diabetes drugs.  Symptoms include tremors, sweating, hunger, confusion, headache, etc. Treat IMMEDIATELY with 15 grams of Carbs: 4 glucose tablets  cup regular juice/soda 2 tablespoons raisins 4 teaspoons sugar 1 tablespoon honey Recheck blood glucose in 15 mins and repeat above if still symptomatic/blood glucose <100.  RECOMMENDATIONS TO REDUCE YOUR RISK OF DIABETIC COMPLICATIONS: * Take your prescribed MEDICATION(S) * Follow a DIABETIC  diet: Complex carbs, fiber rich foods, (monounsaturated and polyunsaturated) fats * AVOID saturated/trans fats, high fat foods, >2,300 mg salt per day. * EXERCISE at least 5 times a week for 30 minutes or preferably daily.  * DO NOT SMOKE OR DRINK more than 1 drink a day. * Check your FEET every day. Do not wear tightfitting shoes. Contact us if you develop an ulcer * See your EYE doctor once a year or more if needed * Get a FLU shot once a year * Get a PNEUMONIA vaccine once before and once after age 72 years  GOALS:  * Your Hemoglobin A1c of <7%  * fasting sugars need to be <130 * after meals sugars need to be <180 (2h after you start eating) * Your Systolic BP should be [id number] or lower  * Your Diastolic BP should be 80 or lower  * Your HDL (Good Cholesterol) should be 40 or higher  * Your LDL (Bad Cholesterol) should be 100 or lower. * Your Triglycerides should be 150 or lower  * Your Urine microalbumin (kidney function) should be <30 * Your Body Mass Index should be 25 or lower   Please consider the following ways to cut down carbs and fat and increase fiber and micronutrients in your diet: - substitute whole grain for white bread or pasta - substitute brown rice for white rice - substitute 90-calorie flat bread pieces for slices of bread when possible - substitute sweet potatoes or yams for white potatoes - substitute humus for margarine - substitute tofu for cheese when possible - substitute almond or rice milk for regular milk (would not drink  soy milk daily due to concern for soy estrogen influence on breast cancer risk) - substitute dark chocolate for other sweets when possible - substitute water - can add lemon or orange slices for taste - for diet sodas (artificial sweeteners will trick your body that you can eat sweets without getting calories and will lead you to overeating and weight gain in the long run) - do not skip breakfast or other meals (this will slow down the  metabolism and will result in more weight gain over time)  - can try smoothies made from fruit and almond/rice milk in am instead of regular breakfast - can also try old-fashioned (not instant) oatmeal made with almond/rice milk in am - order the dressing on the side when eating salad at a restaurant (pour less than half of the dressing on the salad) - eat as little meat as possible - can try juicing, but should not forget that juicing will get rid of the fiber, so would alternate with eating raw veg./fruits or drinking smoothies - use as little oil as possible, even when using olive oil - can dress a salad with a mix of balsamic vinegar and lemon juice, for e.g. - use agave nectar, stevia sugar, or regular sugar rather than artificial sweateners - steam or broil/roast veggies  - snack on veggies/fruit/nuts (unsalted, preferably) when possible, rather than processed foods - reduce or eliminate aspartame in diet (it is in diet sodas, chewing gum, etc) Read the labels!  Try to read Dr. Janene Harvey book: "Program for Reversing Diabetes" for other ideas for healthy eating.
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
## 2021-02-06 DIAGNOSIS — H6123 Impacted cerumen, bilateral: Secondary | ICD-10-CM | POA: Diagnosis not present

## 2021-02-13 ENCOUNTER — Encounter: Payer: 59 | Admitting: Internal Medicine

## 2021-03-03 DIAGNOSIS — D1801 Hemangioma of skin and subcutaneous tissue: Secondary | ICD-10-CM | POA: Diagnosis not present

## 2021-03-03 DIAGNOSIS — D2372 Other benign neoplasm of skin of left lower limb, including hip: Secondary | ICD-10-CM | POA: Diagnosis not present

## 2021-03-03 DIAGNOSIS — L821 Other seborrheic keratosis: Secondary | ICD-10-CM | POA: Diagnosis not present

## 2021-03-03 DIAGNOSIS — D0439 Carcinoma in situ of skin of other parts of face: Secondary | ICD-10-CM | POA: Diagnosis not present

## 2021-03-03 DIAGNOSIS — L570 Actinic keratosis: Secondary | ICD-10-CM | POA: Diagnosis not present

## 2021-03-03 DIAGNOSIS — D485 Neoplasm of uncertain behavior of skin: Secondary | ICD-10-CM | POA: Diagnosis not present

## 2021-03-03 DIAGNOSIS — Z85828 Personal history of other malignant neoplasm of skin: Secondary | ICD-10-CM | POA: Diagnosis not present

## 2021-03-20 ENCOUNTER — Encounter: Payer: Self-pay | Admitting: Internal Medicine

## 2021-03-20 ENCOUNTER — Other Ambulatory Visit: Payer: Self-pay

## 2021-03-20 ENCOUNTER — Ambulatory Visit (INDEPENDENT_AMBULATORY_CARE_PROVIDER_SITE_OTHER): Payer: 59 | Admitting: Internal Medicine

## 2021-03-20 VITALS — BP 96/64 | HR 76 | Temp 97.8°F | Ht 63.75 in | Wt 180.0 lb

## 2021-03-20 DIAGNOSIS — I491 Atrial premature depolarization: Secondary | ICD-10-CM

## 2021-03-20 DIAGNOSIS — R490 Dysphonia: Secondary | ICD-10-CM

## 2021-03-20 DIAGNOSIS — I10 Essential (primary) hypertension: Secondary | ICD-10-CM

## 2021-03-20 DIAGNOSIS — E039 Hypothyroidism, unspecified: Secondary | ICD-10-CM

## 2021-03-20 DIAGNOSIS — E079 Disorder of thyroid, unspecified: Secondary | ICD-10-CM

## 2021-03-20 DIAGNOSIS — N1831 Chronic kidney disease, stage 3a: Secondary | ICD-10-CM

## 2021-03-20 DIAGNOSIS — Z8739 Personal history of other diseases of the musculoskeletal system and connective tissue: Secondary | ICD-10-CM | POA: Diagnosis not present

## 2021-03-20 DIAGNOSIS — R002 Palpitations: Secondary | ICD-10-CM | POA: Diagnosis not present

## 2021-03-20 DIAGNOSIS — L509 Urticaria, unspecified: Secondary | ICD-10-CM

## 2021-03-20 DIAGNOSIS — Z125 Encounter for screening for malignant neoplasm of prostate: Secondary | ICD-10-CM

## 2021-03-20 DIAGNOSIS — M7918 Myalgia, other site: Secondary | ICD-10-CM | POA: Diagnosis not present

## 2021-03-20 DIAGNOSIS — E782 Mixed hyperlipidemia: Secondary | ICD-10-CM | POA: Diagnosis not present

## 2021-03-20 DIAGNOSIS — E1122 Type 2 diabetes mellitus with diabetic chronic kidney disease: Secondary | ICD-10-CM | POA: Diagnosis not present

## 2021-03-20 DIAGNOSIS — Z8719 Personal history of other diseases of the digestive system: Secondary | ICD-10-CM

## 2021-03-20 DIAGNOSIS — L299 Pruritus, unspecified: Secondary | ICD-10-CM

## 2021-03-20 DIAGNOSIS — Z0000 Encounter for general adult medical examination without abnormal findings: Secondary | ICD-10-CM

## 2021-03-20 DIAGNOSIS — Z87898 Personal history of other specified conditions: Secondary | ICD-10-CM

## 2021-03-20 DIAGNOSIS — M5432 Sciatica, left side: Secondary | ICD-10-CM

## 2021-03-20 DIAGNOSIS — F4310 Post-traumatic stress disorder, unspecified: Secondary | ICD-10-CM

## 2021-03-20 MED ORDER — HYDROXYZINE HCL 25 MG PO TABS
25.0000 mg | ORAL_TABLET | Freq: Every day | ORAL | 3 refills | Status: DC
Start: 2021-03-20 — End: 2022-02-08

## 2021-03-20 MED ORDER — KETOCONAZOLE 2 % EX CREA: 1.0000 "application " | TOPICAL_CREAM | Freq: Every day | CUTANEOUS | 3 refills | Status: DC | PRN

## 2021-03-20 MED ORDER — VASCEPA 1 G PO CAPS: 2.0000 g | ORAL_CAPSULE | Freq: Every day | ORAL | 3 refills | Status: DC

## 2021-03-20 MED ORDER — ATORVASTATIN CALCIUM 80 MG PO TABS: 80.0000 mg | ORAL_TABLET | Freq: Every day | ORAL | 3 refills | Status: DC

## 2021-03-20 MED ORDER — TELMISARTAN-HCTZ 40-12.5 MG PO TABS: 0.5000 | ORAL_TABLET | Freq: Every day | ORAL | 3 refills | Status: DC

## 2021-03-20 MED ORDER — MINOXIDIL 2 % EX SOLN: 1.0000 "application " | Freq: Every day | CUTANEOUS | 3 refills | Status: DC

## 2021-03-20 MED ORDER — SITAGLIPTIN PHOS-METFORMIN HCL 50-500 MG PO TABS: 2.0000 | ORAL_TABLET | Freq: Every day | ORAL | 3 refills | Status: DC

## 2021-03-20 MED ORDER — TADALAFIL 20 MG PO TABS: 20.0000 mg | ORAL_TABLET | Freq: Every day | ORAL | 3 refills | Status: DC | PRN

## 2021-03-20 MED ORDER — EZETIMIBE 10 MG PO TABS: 10.0000 mg | ORAL_TABLET | Freq: Every day | ORAL | 3 refills | Status: DC

## 2021-03-20 MED ORDER — ALLOPURINOL 300 MG PO TABS: 300.0000 mg | ORAL_TABLET | Freq: Every day | ORAL | 1 refills | Status: DC

## 2021-03-20 MED ORDER — VICTOZA 18 MG/3ML ~~LOC~~ SOPN: 1.2000 mg | PEN_INJECTOR | Freq: Every day | SUBCUTANEOUS | 3 refills | Status: DC

## 2021-03-20 MED ORDER — CHOLECALCIFEROL 1.25 MG (50000 UT) PO TABS: 50000.0000 [IU] | ORAL_TABLET | ORAL | 3 refills | Status: DC

## 2021-03-20 MED ORDER — LEVOTHYROXINE SODIUM 137 MCG PO TABS: 137.0000 ug | ORAL_TABLET | Freq: Every day | ORAL | 3 refills | Status: DC

## 2021-03-20 NOTE — Patient Instructions (Signed)
It was a pleasure to see you today. Please have fasting labs drawn at your convenience. We will arrange Cardiology referral. RTC in 6 months or as needed. Refills have been sent to [facility] as requested.
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
## 2021-03-23 ENCOUNTER — Other Ambulatory Visit: Payer: Self-pay

## 2021-03-23 MED ORDER — TELMISARTAN-HCTZ 40-12.5 MG PO TABS: 0.5000 | ORAL_TABLET | Freq: Every day | ORAL | 3 refills | Status: DC

## 2021-03-23 MED ORDER — SYNTHROID 137 MCG PO TABS: 137.0000 ug | ORAL_TABLET | Freq: Every day | ORAL | 3 refills | Status: DC

## 2021-03-24 ENCOUNTER — Ambulatory Visit (INDEPENDENT_AMBULATORY_CARE_PROVIDER_SITE_OTHER): Payer: 59

## 2021-03-24 ENCOUNTER — Other Ambulatory Visit: Payer: Self-pay

## 2021-03-24 ENCOUNTER — Telehealth: Payer: Self-pay

## 2021-03-24 ENCOUNTER — Other Ambulatory Visit: Payer: Self-pay | Admitting: *Deleted

## 2021-03-24 DIAGNOSIS — R002 Palpitations: Secondary | ICD-10-CM

## 2021-03-24 MED ORDER — TELMISARTAN-HCTZ 40-12.5 MG PO TABS: 0.5000 | ORAL_TABLET | Freq: Every day | ORAL | 3 refills | Status: DC

## 2021-03-24 NOTE — Telephone Encounter (Signed)
Patient is buying this out of pocket.

## 2021-03-24 NOTE — Telephone Encounter (Signed)
[facility] called. D3 is not covered but Vitamin D2 is. They would like to know if they can change his D3 to D2. Please advise and I will call them back.   [facility] [phone] Ref# [id number]

## 2021-03-24 NOTE — Progress Notes (Unsigned)
Patient enrolled for [employer] to mail a 14 day ZIO XT monitor to his address on file. Instructions sent via My Chart message.

## 2021-04-05 DIAGNOSIS — R002 Palpitations: Secondary | ICD-10-CM | POA: Diagnosis not present

## 2021-04-15 ENCOUNTER — Other Ambulatory Visit: Payer: Self-pay

## 2021-04-15 DIAGNOSIS — E063 Autoimmune thyroiditis: Secondary | ICD-10-CM

## 2021-04-15 DIAGNOSIS — Z125 Encounter for screening for malignant neoplasm of prostate: Secondary | ICD-10-CM | POA: Diagnosis not present

## 2021-04-15 DIAGNOSIS — E559 Vitamin D deficiency, unspecified: Secondary | ICD-10-CM | POA: Diagnosis not present

## 2021-04-15 DIAGNOSIS — E782 Mixed hyperlipidemia: Secondary | ICD-10-CM | POA: Diagnosis not present

## 2021-04-15 DIAGNOSIS — E1122 Type 2 diabetes mellitus with diabetic chronic kidney disease: Secondary | ICD-10-CM

## 2021-04-15 DIAGNOSIS — E079 Disorder of thyroid, unspecified: Secondary | ICD-10-CM | POA: Diagnosis not present

## 2021-04-15 DIAGNOSIS — E1136 Type 2 diabetes mellitus with diabetic cataract: Secondary | ICD-10-CM

## 2021-04-15 DIAGNOSIS — I10 Essential (primary) hypertension: Secondary | ICD-10-CM | POA: Diagnosis not present

## 2021-04-15 DIAGNOSIS — E038 Other specified hypothyroidism: Secondary | ICD-10-CM

## 2021-04-15 DIAGNOSIS — E7849 Other hyperlipidemia: Secondary | ICD-10-CM

## 2021-04-16 DIAGNOSIS — Z125 Encounter for screening for malignant neoplasm of prostate: Secondary | ICD-10-CM | POA: Diagnosis not present

## 2021-04-16 DIAGNOSIS — E1122 Type 2 diabetes mellitus with diabetic chronic kidney disease: Secondary | ICD-10-CM | POA: Diagnosis not present

## 2021-04-16 DIAGNOSIS — E079 Disorder of thyroid, unspecified: Secondary | ICD-10-CM | POA: Diagnosis not present

## 2021-04-16 DIAGNOSIS — E559 Vitamin D deficiency, unspecified: Secondary | ICD-10-CM | POA: Diagnosis not present

## 2021-04-16 DIAGNOSIS — I10 Essential (primary) hypertension: Secondary | ICD-10-CM | POA: Diagnosis not present

## 2021-04-16 DIAGNOSIS — E782 Mixed hyperlipidemia: Secondary | ICD-10-CM | POA: Diagnosis not present

## 2021-04-16 LAB — COMPLETE METABOLIC PANEL WITH GFR
AG Ratio: 1.6 (calc) (ref 1.0–2.5)
ALT: 22 U/L (ref 9–46)
AST: 20 U/L (ref 10–35)
Albumin: 4.2 g/dL (ref 3.6–5.1)
Alkaline phosphatase (APISO): 74 U/L (ref 35–144)
BUN/Creatinine Ratio: 21 (calc) (ref 6–22)
BUN: 36 mg/dL — ABNORMAL HIGH (ref 7–25)
CO2: 20 mmol/L (ref 20–32)
Calcium: 10.1 mg/dL (ref 8.6–10.3)
Chloride: 105 mmol/L (ref 98–110)
Creat: 1.72 mg/dL — ABNORMAL HIGH (ref 0.70–1.28)
Globulin: 2.6 g/dL (calc) (ref 1.9–3.7)
Glucose, Bld: 91 mg/dL (ref 65–99)
Potassium: 4.4 mmol/L (ref 3.5–5.3)
Sodium: 140 mmol/L (ref 135–146)
Total Bilirubin: 0.4 mg/dL (ref 0.2–1.2)
Total Protein: 6.8 g/dL (ref 6.1–8.1)
eGFR: 41 mL/min/{1.73_m2} — ABNORMAL LOW (ref >=60)

## 2021-04-16 LAB — CBC WITH DIFFERENTIAL/PLATELET
Absolute Monocytes: 504 cells/uL (ref 200–950)
Basophils Absolute: 51 cells/uL (ref 0–200)
Basophils Relative: 0.7 %
Eosinophils Absolute: 380 cells/uL (ref 15–500)
Eosinophils Relative: 5.2 %
HCT: 49.4 % (ref 38.5–50.0)
Hemoglobin: 16.7 g/dL (ref 13.2–17.1)
Lymphs Abs: 1774 cells/uL (ref 850–3900)
MCH: 31.3 pg (ref 27.0–33.0)
MCHC: 33.8 g/dL (ref 32.0–36.0)
MCV: 92.5 fL (ref 80.0–100.0)
MPV: 11.9 fL (ref 7.5–12.5)
Monocytes Relative: 6.9 %
Neutro Abs: 4592 cells/uL (ref 1500–7800)
Neutrophils Relative %: 62.9 %
Platelets: 225 10*3/uL (ref 140–400)
RBC: 5.34 10*6/uL (ref 4.20–5.80)
RDW: 14.5 % (ref 11.0–15.0)
Total Lymphocyte: 24.3 %
WBC: 7.3 10*3/uL (ref 3.8–10.8)

## 2021-04-16 LAB — HEMOGLOBIN A1C
Hgb A1c MFr Bld: 5.7 % of total Hgb — ABNORMAL HIGH (ref <5.7)
Mean Plasma Glucose: 117 mg/dL
eAG (mmol/L): 6.5 mmol/L

## 2021-04-16 LAB — T3, FREE: T3, Free: 3 pg/mL (ref 2.3–4.2)

## 2021-04-16 LAB — LIPID PANEL
Cholesterol: 256 mg/dL — ABNORMAL HIGH (ref <200)
HDL: 56 mg/dL (ref >=40)
LDL Cholesterol (Calc): 161 mg/dL (calc) — ABNORMAL HIGH
Non-HDL Cholesterol (Calc): 200 mg/dL (calc) — ABNORMAL HIGH (ref <130)
Total CHOL/HDL Ratio: 4.6 (calc) (ref <5.0)
Triglycerides: 220 mg/dL — ABNORMAL HIGH (ref <150)

## 2021-04-16 LAB — TSH: TSH: 2.33 mIU/L (ref 0.40–4.50)

## 2021-04-16 LAB — VITAMIN D 25 HYDROXY (VIT D DEFICIENCY, FRACTURES): Vit D, 25-Hydroxy: 93 ng/mL (ref 30–100)

## 2021-04-16 LAB — PSA: PSA: 0.04 ng/mL (ref <=4.00)

## 2021-04-16 LAB — T4, FREE: Free T4: 1.4 ng/dL (ref 0.8–1.8)

## 2021-04-17 ENCOUNTER — Ambulatory Visit (INDEPENDENT_AMBULATORY_CARE_PROVIDER_SITE_OTHER): Payer: 59 | Admitting: Internal Medicine

## 2021-04-17 ENCOUNTER — Other Ambulatory Visit: Payer: Self-pay

## 2021-04-17 ENCOUNTER — Encounter: Payer: Self-pay | Admitting: Internal Medicine

## 2021-04-17 VITALS — BP 120/58 | HR 80 | Ht 63.75 in | Wt 179.4 lb

## 2021-04-17 VITALS — BP 114/76 | HR 68 | Temp 97.6°F | Resp 18 | Wt 178.0 lb

## 2021-04-17 DIAGNOSIS — E782 Mixed hyperlipidemia: Secondary | ICD-10-CM

## 2021-04-17 DIAGNOSIS — E038 Other specified hypothyroidism: Secondary | ICD-10-CM

## 2021-04-17 DIAGNOSIS — E7849 Other hyperlipidemia: Secondary | ICD-10-CM

## 2021-04-17 DIAGNOSIS — E1136 Type 2 diabetes mellitus with diabetic cataract: Secondary | ICD-10-CM | POA: Diagnosis not present

## 2021-04-17 DIAGNOSIS — E559 Vitamin D deficiency, unspecified: Secondary | ICD-10-CM

## 2021-04-17 DIAGNOSIS — E063 Autoimmune thyroiditis: Secondary | ICD-10-CM

## 2021-04-17 DIAGNOSIS — I491 Atrial premature depolarization: Secondary | ICD-10-CM | POA: Diagnosis not present

## 2021-04-17 DIAGNOSIS — J069 Acute upper respiratory infection, unspecified: Secondary | ICD-10-CM

## 2021-04-17 LAB — MICROALBUMIN / CREATININE URINE RATIO
Creatinine, Urine: 67 mg/dL (ref 20–320)
Microalb Creat Ratio: 236 mcg/mg creat — ABNORMAL HIGH (ref <30)
Microalb, Ur: 15.8 mg/dL

## 2021-04-17 MED ORDER — ATORVASTATIN CALCIUM 80 MG PO TABS: 80.0000 mg | ORAL_TABLET | Freq: Every day | ORAL | 3 refills | Status: DC

## 2021-04-17 NOTE — Patient Instructions (Addendum)
Please continue: - JanuMet 50-500 mg 2x a day, with meals - Victoza 1.2-1.8 mg before b'fast  Check sugars 1x a day, rotating check times.  Please continue Synthroid 137 mcg 6/7 days: every day, with water, at least 30 minutes before breakfast, separated by at least 4 hours from: - acid reflux medications - calcium - iron - multivitamins  Try to schedule a new eye exam.  Please return in 4-6 months.
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
## 2021-04-17 NOTE — Progress Notes (Signed)
   Subjective:    Patient ID: Sherrlyn Hock, MD, male    DOB: [birth year], 75 y.o.   MRN: [id number]  HPI He is here to follow up on palpitations / PACs. He needs Atorvastatin refilled to mail order pharmacy. Has been out of statin medication and that is why lipids are elevated. This will be sent. His diabetes is under excellent control at 5.7% and will be seeing Dr. Cruzita Lederer for that. Has appt  in January to see Dr. Pernell Dupre.  Zio patch has been ordered by Dr. Tamala Julian.  Review of Systems other than recent URI no new complaints. No Chest pain or SOB     Objective:   Physical Exam  VS reviewed. TMs are not red. Chest is clear. No rales or wheezing. Cor: frequent irregular contractions previously evaluated and found to be PACs.      Assessment & Plan:   Acute URI- does not want antibiotic  Type 2 diabetes well controlled and will be seeing Dr. Cruzita Lederer since Dr. Elyse Hsu retired.  Irregular rhythm thought to be PACs per recent initial evaluation here with no chest pain or SOB. To see Dr. Pernell Dupre in January.   Mixed Hyperlipidemia- have  sent order for mail order statin Rx  RTC one year or as needed. He can contact me anytime.

## 2021-04-22 ENCOUNTER — Telehealth: Payer: Self-pay | Admitting: Internal Medicine

## 2021-04-22 ENCOUNTER — Telehealth: Payer: Self-pay

## 2021-04-22 ENCOUNTER — Encounter: Payer: Self-pay | Admitting: Internal Medicine

## 2021-04-22 DIAGNOSIS — J22 Unspecified acute lower respiratory infection: Secondary | ICD-10-CM

## 2021-04-22 MED ORDER — AZITHROMYCIN 250 MG PO TABS: ORAL_TABLET | ORAL | 0 refills | Status: DC

## 2021-04-22 MED ORDER — AZITHROMYCIN 250 MG PO TABS: ORAL_TABLET | ORAL | 0 refills | Status: AC

## 2021-04-22 NOTE — Telephone Encounter (Signed)
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 He has taken 2 COVID tests both of which were negative.  He has used his inhaler for cough and lower respiratory congestion.  No shaking chills.  Some thick sputum production.  Wife has similar illness.  He has a history of type 2 diabetes mellitus.  History of sleep apnea and allergic rhinitis.  We are sending in Zithromax Z-PAK 2 tabs day 1 followed by 1 tab days 2 through 5.  We discussed taking oral steroids and he would like to hold off on that at this time.  Continue to rest and drink plenty of fluids.

## 2021-04-22 NOTE — Telephone Encounter (Signed)
D Keetch called wanting to talk to Dr Renold Genta. States that he has nasal congestion and a cough. Feels it is in chest. No wheezing or rales but deep breathig sets off coughing spells and SOB. Covid test 2 days ago was negative. He is going to do another today. He would like Dr Renold Genta to call him. He was advised she was not in today.

## 2021-04-24 ENCOUNTER — Other Ambulatory Visit: Payer: Self-pay

## 2021-04-24 ENCOUNTER — Encounter: Payer: 59 | Admitting: Internal Medicine

## 2021-04-24 MED ORDER — BENZONATATE 100 MG PO CAPS: 100.0000 mg | ORAL_CAPSULE | Freq: Three times a day (TID) | ORAL | 1 refills | Status: DC

## 2021-04-24 NOTE — Progress Notes (Signed)
Done

## 2021-04-24 NOTE — Progress Notes (Signed)
Patient states that he was offered tessalon pearls but thought he had an rx for it already. He found out today he does not and is requesting an rx be sent to [facility].

## 2021-04-28 DIAGNOSIS — R002 Palpitations: Secondary | ICD-10-CM | POA: Diagnosis not present

## 2021-05-03 NOTE — Progress Notes (Incomplete)
Cardiology Office Note:    Date:  05/03/2021   ID:  Sherrlyn Hock, MD, DOB 1946-01-09, MRN [id number]  PCP:  Elby Showers, MD  Cardiologist:  None   Referring MD: Elby Showers, MD   No chief complaint on file.   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   ***  Past Medical History:  Diagnosis Date   Allergy    Arthritis    Diabetes mellitus (HCC)    GERD (gastroesophageal reflux disease)    Gout    HOH (hard of hearing)    Hx of skin cancer, basal cell    Hyperlipemia    Hypertension    Hypothyroidism    Obstructive sleep apnea 11/07/2015   Pneumonia    Prostate cancer ([place])    Renal disorder    mild renal insufficiency   Rupture quadriceps tendon 09-21-13   left   Seizures ([place]) 2000   only one time-never dx why-no meds   Skin cancer    Sleep apnea    Thyroid disease    hypothyroidism   Wears glasses     Past Surgical History:  Procedure Laterality Date   COLONOSCOPY     FACIAL FRACTURE SURGERY  1965   KNEE ARTHROSCOPY Left 09/26/2013   Procedure: LEFT KNEE ARTHROSCOPY WITH DEBRIDEMENT/SHAVING (CHONDROPLASTY), SUTURE REPAIR QUADRICEPS/HAMSTRING MUSCLE RUPTURE PRIMARY;  Surgeon: Lorn Junes, MD;  Location: [place];  Service: Orthopedics;  Laterality: Left;   KNEE SURGERY     left   LYMPHADENECTOMY Bilateral 05/05/2020   Procedure: LYMPHADENECTOMY, PELVIC;  Surgeon: Raynelle Bring, MD;  Location: [facility];  Service: Urology;  Laterality: Bilateral;   PROSTATE BIOPSY     QUADRICEPS TENDON REPAIR Left 09/26/2013   Procedure: REPAIR QUADRICEP TENDON;  Surgeon: Lorn Junes, MD;  Location: [place];  Service: Orthopedics;  Laterality: Left;   ROBOT ASSISTED LAPAROSCOPIC RADICAL PROSTATECTOMY N/A  05/05/2020   Procedure: XI ROBOTIC ASSISTED LAPAROSCOPIC RADICAL PROSTATECTOMY LEVEL 2;  Surgeon: Raynelle Bring, MD;  Location: [facility];  Service: Urology;  Laterality: N/A;   TONSILLECTOMY      Current Medications: No outpatient medications have been marked as taking for the 05/04/21 encounter (Appointment) with Belva Crome, MD.     Allergies:   Amlodipine, Atenolol, Celecoxib, Lisinopril, Nsaids, and Ranitidine hcl   Social History   Socioeconomic History   Marital status: Married    Spouse name: Elmo Rio, RN    Number of children: 2   Years of education: Not on file   Highest education level: Not on file  Occupational History    Comment: pediatric and adult endocrinologist  Tobacco Use   Smoking status: Former    Packs/day: 0.50    Years: 19.00    Pack years: 9.50    Types: Cigarettes    Quit date: 09/21/1972    Years since quitting: 48.6   Smokeless tobacco: Never  Vaping Use   Vaping Use: Never used  Substance and Sexual Activity   Alcohol use: No    Alcohol/week: 14.0 standard drinks    Types: 7 Glasses of wine, 7 Shots of liquor per week    Comment: no longer drinks   Drug use: No   Sexual activity: Not Currently  Other Topics Concern   Not on file  Social History Narrative   Not on file   Social Determinants of Health   Financial Resource Strain: Not on file  Food Insecurity: Not on file  Transportation Needs: Not on file  Physical Activity: Not on file  Stress: Not on file  Social Connections: Not on file     Family History: The patient's family history includes Breast cancer in his mother and sister; Diabetes in an other family member; Heart disease in his father and mother; Hypertension in an other family member. There is no history of Esophageal cancer, Rectal cancer, Stomach cancer, Prostate cancer, Pancreatic cancer, or Colon cancer.  ROS:   Please see the history of present illness.    *** All other systems reviewed and are  negative.  EKGs/Labs/Other Studies Reviewed:    The following studies were reviewed today: Continuous Monitor 05/03/2021: Study Highlights    NSR Tachycardia Bradycardia syndrome No atrial fibrillation No excessive pauses No ventricular tachycardia Frequent PAC's Frequent non-sustained ectopic atrial tachycard that correlates with symptoms/events     Patch Wear Time:  14 days and 0 hours (2022-11-20T12:57:18-0500 to 2022-12-04T12:57:22-0500)   Patient had a min HR of 40 bpm, max HR of 171 bpm, and avg HR of 68 bpm. Predominant underlying rhythm was Sinus Rhythm. 944 Supraventricular Tachycardia runs occurred, the run with the fastest interval lasting 10 beats with a max rate of 171 bpm, the  longest lasting 35.4 secs with an avg rate of 129 bpm. Supraventricular Tachycardia was detected within +/- 45 seconds of symptomatic patient event(s). Isolated SVEs were frequent (10.8%, [id number]), SVE Couplets were occasional (1.3%, 8796), and SVE  Triplets were rare (<1.0%, 3706). Isolated VEs were rare (<1.0%), VE Couplets were rare (<1.0%), and no VE Triplets were present.   EKG:  EKG ***  Recent Labs: 04/15/2021: ALT 22; BUN 36; Creat 1.72; Hemoglobin 16.7; Platelets 225; Potassium 4.4; Sodium 140; TSH 2.33  Recent Lipid Panel    Component Value Date/Time   CHOL 256 (H) 04/15/2021 0820   TRIG 220 (H) 04/15/2021 0820   HDL 56 04/15/2021 0820   CHOLHDL 4.6 04/15/2021 0820   LDLCALC 161 (H) 04/15/2021 0820    Physical Exam:    VS:  There were no vitals taken for this visit.    Wt Readings from Last 3 Encounters:  04/17/21 178 lb (80.7 kg)  04/17/21 179 lb 6.4 oz (81.4 kg)  03/20/21 180 lb (81.6 kg)     GEN: ***. No acute distress HEENT: Normal NECK: No JVD. LYMPHATICS: No lymphadenopathy CARDIAC: *** murmur. RRR *** gallop, or edema. VASCULAR: *** Normal Pulses. No bruits. RESPIRATORY:  Clear to auscultation without rales, wheezing or rhonchi  ABDOMEN: Soft, non-tender,  non-distended, No pulsatile mass, MUSCULOSKELETAL: No deformity  SKIN: Warm and dry NEUROLOGIC:  Alert and oriented x 3 PSYCHIATRIC:  Normal affect   ASSESSMENT:    No diagnosis found. PLAN:    In order of problems listed above:  ***   Medication Adjustments/Labs and Tests Ordered: Current medicines are reviewed at length with the patient today.  Concerns regarding medicines are outlined above.  No orders of the defined types were placed in this encounter.  No orders of the defined types were placed in this encounter.   There are no Patient Instructions on file for this visit.   Signed, Sinclair Grooms, MD  05/03/2021 9:05 AM    [place]

## 2021-05-04 ENCOUNTER — Ambulatory Visit: Payer: 59 | Admitting: Interventional Cardiology

## 2021-05-17 DIAGNOSIS — I2510 Atherosclerotic heart disease of native coronary artery without angina pectoris: Secondary | ICD-10-CM

## 2021-05-17 HISTORY — DX: Atherosclerotic heart disease of native coronary artery without angina pectoris: I25.10

## 2021-05-24 ENCOUNTER — Encounter: Payer: Self-pay | Admitting: Internal Medicine

## 2021-05-24 ENCOUNTER — Other Ambulatory Visit: Payer: Self-pay

## 2021-05-24 ENCOUNTER — Encounter (HOSPITAL_COMMUNITY): Payer: Self-pay | Admitting: Emergency Medicine

## 2021-05-24 ENCOUNTER — Emergency Department (HOSPITAL_COMMUNITY): Payer: 59

## 2021-05-24 ENCOUNTER — Emergency Department (HOSPITAL_COMMUNITY)
Admission: EM | Admit: 2021-05-24 | Discharge: 2021-05-24 | Disposition: A | Discharge status: Home / Self Care | Payer: 59 | Attending: Emergency Medicine | Admitting: Emergency Medicine

## 2021-05-24 ENCOUNTER — Other Ambulatory Visit (HOSPITAL_BASED_OUTPATIENT_CLINIC_OR_DEPARTMENT_OTHER): Payer: Self-pay | Admitting: Cardiology

## 2021-05-24 DIAGNOSIS — Z79899 Other long term (current) drug therapy: Secondary | ICD-10-CM | POA: Insufficient documentation

## 2021-05-24 DIAGNOSIS — R079 Chest pain, unspecified: Secondary | ICD-10-CM | POA: Diagnosis not present

## 2021-05-24 DIAGNOSIS — I10 Essential (primary) hypertension: Secondary | ICD-10-CM | POA: Insufficient documentation

## 2021-05-24 DIAGNOSIS — R0789 Other chest pain: Secondary | ICD-10-CM | POA: Diagnosis not present

## 2021-05-24 DIAGNOSIS — E785 Hyperlipidemia, unspecified: Secondary | ICD-10-CM | POA: Diagnosis not present

## 2021-05-24 DIAGNOSIS — E119 Type 2 diabetes mellitus without complications: Secondary | ICD-10-CM | POA: Diagnosis not present

## 2021-05-24 DIAGNOSIS — R072 Precordial pain: Secondary | ICD-10-CM

## 2021-05-24 DIAGNOSIS — Z7984 Long term (current) use of oral hypoglycemic drugs: Secondary | ICD-10-CM | POA: Diagnosis not present

## 2021-05-24 DIAGNOSIS — R002 Palpitations: Secondary | ICD-10-CM | POA: Diagnosis not present

## 2021-05-24 DIAGNOSIS — I491 Atrial premature depolarization: Secondary | ICD-10-CM | POA: Diagnosis not present

## 2021-05-24 DIAGNOSIS — J9811 Atelectasis: Secondary | ICD-10-CM | POA: Diagnosis not present

## 2021-05-24 LAB — BASIC METABOLIC PANEL
Anion gap: 8 (ref 5–15)
BUN: 29 mg/dL — ABNORMAL HIGH (ref 8–23)
CO2: 28 mmol/L (ref 22–32)
Calcium: 9.7 mg/dL (ref 8.9–10.3)
Chloride: 106 mmol/L (ref 98–111)
Creatinine, Ser: 1.78 mg/dL — ABNORMAL HIGH (ref 0.61–1.24)
GFR, Estimated: 39 mL/min — ABNORMAL LOW (ref >60)
Glucose, Bld: 104 mg/dL — ABNORMAL HIGH (ref 70–99)
Potassium: 4.3 mmol/L (ref 3.5–5.1)
Sodium: 142 mmol/L (ref 135–145)

## 2021-05-24 LAB — CBC
HCT: 46.9 % (ref 39.0–52.0)
Hemoglobin: 15.5 g/dL (ref 13.0–17.0)
MCH: 31.1 pg (ref 26.0–34.0)
MCHC: 33 g/dL (ref 30.0–36.0)
MCV: 94 fL (ref 80.0–100.0)
Platelets: 204 10*3/uL (ref 150–400)
RBC: 4.99 MIL/uL (ref 4.22–5.81)
RDW: 14.6 % (ref 11.5–15.5)
WBC: 7.5 10*3/uL (ref 4.0–10.5)
nRBC: 0 % (ref 0.0–0.2)

## 2021-05-24 LAB — TROPONIN I (HIGH SENSITIVITY)
Troponin I (High Sensitivity): 10 ng/L (ref <18)
Troponin I (High Sensitivity): 10 ng/L (ref <18)

## 2021-05-24 NOTE — ED Notes (Signed)
Pt verbalized understanding of d/c instructions, meds and followup care. Denies questions. VSS, no distress noted. Steady gait to exit with all belongings.  ?

## 2021-05-24 NOTE — Consult Note (Signed)
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 Darl Householder.  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 He was seen by Dr. Tamala Julian and recently wore a monitor, which we reviewed. This morning, he was awoken from sleep around 4 AM with left sided chest pain and tachycardia. He felt that his pulse was around 120 bpm and felt irregular. He took gaviscon, not sure how much it helped, and after the symptoms resolved he tried to go back to bed. Unfortunately, he had similar symptoms several more times, and he proceeded to the ER for further evaluation.  The pain is focal, left sided, dull/squeezing. Not like prior episodes of costochondritis. Not exertional, related to food or position. No clear aggravating/alleviating factors.  No loss of consciousness since grand mal seizure years ago.  He has a history of type II diabetes. Hypertension, and hyperlipidemia. This has overall been well controlled. He restarted atorvastatin 6-8 weeks ago after having issues with express scripts for several months.   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  On review his workup thus far, initial hsTn 10. Repeat just returned, also 10.  ECG sinus bradycardia with PACs Telemetry sinus rhythm/sinus bradycardia with PACs  He had several brief episodes of chest pain while I was in the room. No changes on telemetry during that time.   Past Medical History:  Diagnosis Date   Allergy     Arthritis    Diabetes mellitus (HCC)    GERD (gastroesophageal reflux disease)    Gout    HOH (hard of hearing)    Hx of skin cancer, basal cell    Hyperlipemia    Hypertension    Hypothyroidism    Obstructive sleep apnea 11/07/2015   Pneumonia    Prostate cancer ([place])    Renal disorder    mild renal insufficiency   Rupture quadriceps tendon 09-21-13   left   Seizures ([place]) 2000   only one time-never dx why-no meds   Skin cancer    Sleep apnea    Thyroid disease    hypothyroidism   Wears glasses     Past Surgical History:  Procedure Laterality Date   COLONOSCOPY     FACIAL FRACTURE SURGERY  1965   KNEE ARTHROSCOPY Left 09/26/2013   Procedure: LEFT KNEE ARTHROSCOPY WITH DEBRIDEMENT/SHAVING (CHONDROPLASTY), SUTURE REPAIR QUADRICEPS/HAMSTRING MUSCLE RUPTURE PRIMARY;  Surgeon: Lorn Junes, MD;  Location: [place];  Service: Orthopedics;  Laterality: Left;   KNEE SURGERY     left   LYMPHADENECTOMY Bilateral 05/05/2020   Procedure: LYMPHADENECTOMY, PELVIC;  Surgeon: Raynelle Bring, MD;  [facility];  Service: Urology;  Laterality: Bilateral;   PROSTATE BIOPSY     QUADRICEPS TENDON REPAIR Left 09/26/2013   Procedure: REPAIR QUADRICEP TENDON;  Surgeon: Lorn Junes, MD;  Location: [place];  Service: Orthopedics;  Laterality: Left;   ROBOT ASSISTED LAPAROSCOPIC RADICAL PROSTATECTOMY N/A  05/05/2020   Procedure: XI ROBOTIC ASSISTED LAPAROSCOPIC RADICAL PROSTATECTOMY LEVEL 2;  Surgeon: Raynelle Bring, MD;  [facility];  Service: Urology;  Laterality: N/A;   TONSILLECTOMY       Home Medications:  Prior to Admission medications   Medication Sig Start Date End Date Taking? Authorizing Provider  acetaminophen (TYLENOL) 500 MG tablet Take 1,000 mg by mouth at bedtime.    Yes [provider]  albuterol (VENTOLIN HFA) 108 (90 Base) MCG/ACT inhaler Inhale 2 puffs into the lungs every 6 (six) hours as needed for wheezing or shortness  of breath.  06/28/19  Yes [provider]  allopurinol (ZYLOPRIM) 300 MG tablet Take 1 tablet (300 mg total) by mouth daily. 03/20/21  Yes Baxley, Cresenciano Lick, MD  atorvastatin (LIPITOR) 80 MG tablet Take 1 tablet (80 mg total) by mouth daily. 04/17/21  Yes Baxley, Cresenciano Lick, MD  azelastine (ASTELIN) 0.1 % nasal spray 1-2 puffs each nostril once or twice daily as needed Patient taking differently: Place 2 sprays into both nostrils daily as needed for rhinitis. 03/19/16  Yes Young, Tarri Fuller D, MD  benzonatate (TESSALON) 100 MG capsule Take 1 capsule (100 mg total) by mouth every 8 (eight) hours. Patient taking differently: Take 100 mg by mouth 3 (three) times daily as needed for cough. 04/24/21  Yes Baxley, Cresenciano Lick, MD  calcium carbonate (TUMS EX) 750 MG chewable tablet Chew 2 tablets by mouth at bedtime.    Yes [provider]  Cholecalciferol 1.25 MG (50000 UT) TABS Take 50,000 Units by mouth once a week. Patient taking differently: Take 50,000 Units by mouth once a week. Take on Sunday 03/20/21  Yes Baxley, Cresenciano Lick, MD  ezetimibe (ZETIA) 10 MG tablet Take 1 tablet (10 mg total) by mouth daily. 03/20/21  Yes Elby Showers, MD  FORTESTA 10 MG/ACT (2%) GEL Apply 2 application topically daily. 01/22/16  Yes [provider]  hydrOXYzine (ATARAX/VISTARIL) 25 MG tablet Take 1 tablet (25 mg total) by mouth at bedtime. 03/20/21  Yes Baxley, Cresenciano Lick, MD  ketoconazole (NIZORAL) 2 % cream Apply 1 application topically daily as needed for irritation. 03/20/21  Yes Baxley, Cresenciano Lick, MD  minoxidil (ROGAINE) 2 % external solution Apply 1 application topically daily. 03/20/21  Yes Baxley, Cresenciano Lick, MD  Multiple Vitamins-Minerals (MULTIVITAMIN ADULTS) TABS Take 1 tablet by mouth daily.   Yes [provider]  RABEprazole (ACIPHEX) 20 MG tablet Take 20 mg by mouth in the morning and at bedtime.    Yes [provider]  simethicone (MYLICON) 161 MG chewable tablet Chew 375 mg by mouth at bedtime. Take  3 tablets (375 mg) at bedtime   Yes [provider]  sitaGLIPtin-metformin (JANUMET) 50-500 MG tablet Take 2 tablets by mouth daily. 03/20/21  Yes Baxley, Cresenciano Lick, MD  SYNTHROID 137 MCG tablet Take 1 tablet (137 mcg total) by mouth daily before breakfast. Patient taking differently: Take 137 mcg by mouth daily before breakfast. 6 days a week but NOT ON SUNDAYS 03/23/21  Yes Baxley, Cresenciano Lick, MD  tadalafil (CIALIS) 20 MG tablet Take 1 tablet (20 mg total) by mouth daily as needed for erectile dysfunction. 03/20/21  Yes Baxley, Cresenciano Lick, MD  telmisartan-hydrochlorothiazide (MICARDIS HCT) 40-12.5 MG tablet Take 0.5 tablets by mouth daily. Please discard remaining portion of pill. Patient taking differently: Take 0.25 tablets by mouth daily. Pt takes (0.25 mg) daily. Please discard remaining portion of pill. 03/24/21  Yes Baxley, Cresenciano Lick, MD  VASCEPA  1 g capsule Take 2 capsules (2 g total) by mouth at bedtime. 03/20/21  Yes Baxley, Cresenciano Lick, MD  VICTOZA 18 MG/3ML SOPN Inject 1.2 mg into the skin daily. 03/20/21  Yes Baxley, Cresenciano Lick, MD  Accu-Chek FastClix Lancets MISC Apply topically. 12/24/19   [provider]  atorvastatin (LIPITOR) 80 MG tablet Take 1 tablet (80 mg total) by mouth daily. Patient not taking: Reported on 05/24/2021 03/20/21   Elby Showers, MD  bacitracin ointment Apply 1 application topically 2 (two) times daily. Patient not taking: Reported on 05/24/2021 10/04/20   Varney Biles, MD  Blood Glucose Monitoring Suppl (FIFTY50 GLUCOSE METER 2.0) w/Device KIT See admin instructions. 05/17/19   [provider]  FREESTYLE LITE test strip  12/24/19   [provider]  ondansetron (ZOFRAN ODT) 4 MG disintegrating tablet 4mg  ODT q4 hours prn nausea/vomit Patient not taking: Reported on 05/24/2021 11/26/20   Deno Etienne, DO  traZODone (DESYREL) 50 MG tablet Take 50 mg by mouth at bedtime. 04/20/21   [provider]  UNIFINE PENTIPS 32G X 4 MM MISC  03/06/20   [provider]    Inpatient Medications: Scheduled Meds:  Continuous Infusions:  PRN Meds:   Allergies:    Allergies  Allergen Reactions   Amlodipine    Atenolol    Celecoxib    Lisinopril Hypertension   Nsaids Other (See Comments)    Kidney damage   Ranitidine Hcl Itching    IV Zantac    Social History:   Social History   Socioeconomic History   Marital status: Married    Spouse name: Otoniel Myhand, RN    Number of children: 2   Years of education: Not on file   Highest education level: Not on file  Occupational History    Comment: pediatric and adult endocrinologist  Tobacco Use   Smoking status: Former    Packs/day: 0.50    Years: 19.00    Pack years: 9.50    Types: Cigarettes    Quit date: 09/21/1972    Years since quitting: 48.7   Smokeless tobacco: Never  Vaping Use   Vaping Use: Never used  Substance and Sexual Activity   Alcohol use: No    Alcohol/week: 14.0 standard drinks    Types: 7 Glasses of wine, 7 Shots of liquor per week    Comment: no longer drinks   Drug use: No   Sexual activity: Not Currently  Other Topics Concern   Not on file  Social History Narrative   Not on file   Social Determinants of Health   Financial Resource Strain: Not on file  Food Insecurity: Not on file  Transportation Needs: Not on file  Physical Activity: Not on file  Stress: Not on file  Social Connections: Not on file  Intimate Partner Violence: Not on file    Family History:    Family History  Problem Relation Age of Onset   Breast cancer Mother    Heart disease Mother    Heart disease Father    Breast cancer Sister    Hypertension Other    Diabetes Other    Esophageal cancer Neg Hx    Rectal cancer Neg Hx    Stomach cancer Neg Hx    Prostate cancer Neg Hx    Pancreatic cancer Neg Hx    Colon cancer Neg Hx      ROS:  Please see the history of present illness.  All other ROS reviewed and  negative.     Physical Exam/Data:   Vitals:    05/24/21 1530 05/24/21 1545 05/24/21 1645 05/24/21 1715  BP: 134/68 (!) 153/72 (!) 146/81 131/75  Pulse: (!) 55 (!) 44 60 (!) 45  Resp: (!) 22 (!) 26 (!) 22 (!) 22  Temp:      TempSrc:      SpO2: 97% 97% 98% 97%  Weight:      Height:       No intake or output data in the 24 hours ending 05/24/21 1819 Last 3 Weights 05/24/2021 04/17/2021 04/17/2021  Weight (lbs) 173 lb 178 lb 179 lb 6.4 oz  Weight (kg) 78.472 kg 80.74 kg 81.375 kg  Some encounter information is confidential and restricted. Go to Review Flowsheets activity to see all data.     Body mass index is 28.79 kg/m.  General:  Well nourished, well developed, in no acute distress HEENT: normal Neck: no JVD Vascular: No carotid bruits; Distal pulses 2+ bilaterally Cardiac:  regular S1, S2 with occasional premature beat no murmur  Lungs:  clear to auscultation bilaterally, no wheezing, rhonchi or rales  Abd: soft, nontender, no hepatomegaly  Ext: no edema Musculoskeletal:  No deformities, moves all 4 limbs independently Skin: warm and dry  Neuro:  CNs 2-12 intact, no focal abnormalities noted Psych:  Normal affect   EKG:  The EKG was personally reviewed and demonstrates:  sinus bradycardia with PACs Telemetry:  Telemetry was personally reviewed and demonstrates:  sinus bradycardia/sinus rhythm with PACs  Relevant CV Studies: Monitor results: NSR Tachycardia Bradycardia syndrome No atrial fibrillation No excessive pauses No ventricular tachycardia Frequent PAC's Frequent non-sustained ectopic atrial tachycard that correlates with symptoms/events     Patch Wear Time:  14 days and 0 hours (2022-11-20T12:57:18-0500 to 2022-12-04T12:57:22-0500)   Patient had a min HR of 40 bpm, max HR of 171 bpm, and avg HR of 68 bpm. Predominant underlying rhythm was Sinus Rhythm. 944 Supraventricular Tachycardia runs occurred, the run with the fastest interval lasting 10 beats with a max rate of 171 bpm, the  longest lasting 35.4 secs with  an avg rate of 129 bpm. Supraventricular Tachycardia was detected within +/- 45 seconds of symptomatic patient event(s). Isolated SVEs were frequent (10.8%, [id number]), SVE Couplets were occasional (1.3%, 8796), and SVE  Triplets were rare (<1.0%, 3706). Isolated VEs were rare (<1.0%), VE Couplets were rare (<1.0%), and no VE Triplets were present.   Laboratory Data:  High Sensitivity Troponin:   Recent Labs  Lab 05/24/21 1158 05/24/21 1354  TROPONINIHS 10 10     Chemistry Recent Labs  Lab 05/24/21 1158  NA 142  K 4.3  CL 106  CO2 28  GLUCOSE 104*  BUN 29*  CREATININE 1.78*  CALCIUM 9.7  GFRNONAA 39*  ANIONGAP 8    No results for input(s): PROT, ALBUMIN, AST, ALT, ALKPHOS, BILITOT in the last 168 hours. Lipids No results for input(s): CHOL, TRIG, HDL, LABVLDL, LDLCALC, CHOLHDL in the last 168 hours.  Hematology Recent Labs  Lab 05/24/21 1158  WBC 7.5  RBC 4.99  HGB 15.5  HCT 46.9  MCV 94.0  MCH 31.1  MCHC 33.0  RDW 14.6  PLT 204   Thyroid No results for input(s): TSH, FREET4 in the last 168 hours.  BNPNo results for input(s): BNP, PROBNP in the last 168 hours.  DDimer No results for input(s): DDIMER in the last 168 hours.   Radiology/Studies:  DG Chest 2 View  Result Date: 05/24/2021 CLINICAL DATA:  Chest pain EXAM: CHEST - 2 VIEW COMPARISON:  None. FINDINGS: Mild atelectasis in the bases. The cardiomediastinal silhouette is stable. No pneumothorax. No nodules or masses. No focal infiltrates. IMPRESSION: No active cardiopulmonary disease. Electronically Signed   By: Dorise Bullion III M.D.   On: 05/24/2021 12:16     Assessment and Plan:   Chest pain Palpitations PACs Risk factors including hypertension, hyperlipidemia, type II diabetes -we discussed events at length today. He has significant risk factors for CAD, but thankfully his ECG and hsTn have been unremarkable -we reviewed potential etiologies for his symptoms -we reviewed red flag warning signs that  need immediate medical attention -discussed cardiac catheterization, nuclear stress/lexiscan, and CT coronary angiography. Discussed pros and cons of each, including but not limited to false positive/false negative risk, radiation risk, and risk of IV contrast dye. Based on shared decision making, decision was made to pursue CT coronary angiography. -will not premedicate with metoprolol, as it has previously dropped his BP. He intermittently has PACs, may need to use IV metoprolol or scan retrospectively if PACs are frequent the day of the test -counseled on use of sublingual nitroglycerin and its importance to a good test   We also discussed [facility] today for monitoring heart rate/rhythm.  Overall we spent time together discussing evaluation as an inpatient vs. As an outpatient. He would prefer to go home, which I believe is reasonable as second troponin was also unremarkable. I gave strict return instructions for symptoms. I also recommend that he try to avoid heavy workload/being on call until we can assess him for coronary disease.  He understands and agrees with the plan.  Time Spent with Patient: I have spent a total of 80 minutes with patient reviewing hospital notes, telemetry, EKGs, labs and examining the patient as well as establishing an assessment and plan that was discussed with the patient.  > 50% of time was spent in direct patient care.    Risk Assessment/Risk Scores:     HEAR Score (for undifferentiated chest pain):  HEAR Score: 4      [facility] will sign off.   Medication Recommendations:  no changes Other recommendations (labs, testing, etc):  we will arrange for outpatient CT coronary angiography Follow up as an outpatient:  he has follow up with Dr. Tamala Julian on 06/05/21  For questions or updates, please contact [facility] Please consult [email or web address] for contact info under    Signed, Buford Dresser, MD  05/24/2021 6:19 PM

## 2021-05-24 NOTE — ED Provider Notes (Signed)
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  Patient had palpitations several weeks ago and had a Holter monitor that showed SVT.  Patient states that since yesterday he has been having some palpitations.  He checked his pulse and was in the 120s occasionally.  Patient also has some chest pressure.  He took 4 aspirins prior to arrival.  Patient follows up with Dr. Tamala Julian from cardiology.  Patient states that he has follow-up with cardiology on January 20.  Patient never had a heart catheterization.  He has no known CAD or stents.  He however he states that he has family history of MI and his father died of MI age 87s  The history is provided by the patient.      Home Medications Prior to Admission medications   Medication Sig Start Date End Date Taking? Authorizing Provider  acetaminophen (TYLENOL) 500 MG tablet Take 1,000 mg by mouth at bedtime.    Yes [provider]  albuterol (VENTOLIN HFA) 108 (90 Base) MCG/ACT inhaler Inhale 2 puffs into the lungs every 6 (six) hours as needed for wheezing or shortness of breath.  06/28/19  Yes [provider]  allopurinol (ZYLOPRIM) 300 MG tablet Take 1 tablet (300 mg total) by mouth daily. 03/20/21  Yes Baxley, Cresenciano Lick, MD  atorvastatin (LIPITOR) 80 MG tablet Take 1 tablet (80 mg total) by mouth daily. 04/17/21  Yes Baxley, Cresenciano Lick, MD  azelastine (ASTELIN) 0.1 % nasal spray 1-2 puffs each nostril once or twice daily as needed Patient taking differently: Place 2 sprays into both nostrils daily as needed for rhinitis. 03/19/16  Yes Young, Tarri Fuller D, MD  benzonatate (TESSALON) 100 MG capsule Take 1 capsule (100 mg total) by mouth every 8 (eight) hours. Patient taking  differently: Take 100 mg by mouth 3 (three) times daily as needed for cough. 04/24/21  Yes Baxley, Cresenciano Lick, MD  calcium carbonate (TUMS EX) 750 MG chewable tablet Chew 2 tablets by mouth at bedtime.    Yes [provider]  Cholecalciferol 1.25 MG (50000 UT) TABS Take 50,000 Units by mouth once a week. Patient taking differently: Take 50,000 Units by mouth once a week. Take on Sunday 03/20/21  Yes Baxley, Cresenciano Lick, MD  ezetimibe (ZETIA) 10 MG tablet Take 1 tablet (10 mg total) by mouth daily. 03/20/21  Yes Elby Showers, MD  FORTESTA 10 MG/ACT (2%) GEL Apply 2 application topically daily. 01/22/16  Yes [provider]  hydrOXYzine (ATARAX/VISTARIL) 25 MG tablet Take 1 tablet (25 mg total) by mouth at bedtime. 03/20/21  Yes Baxley, Cresenciano Lick, MD  ketoconazole (NIZORAL) 2 % cream Apply 1 application topically daily as needed for irritation. 03/20/21  Yes Baxley, Cresenciano Lick, MD  minoxidil (ROGAINE) 2 % external solution Apply 1 application topically daily. 03/20/21  Yes Baxley, Cresenciano Lick, MD  Multiple Vitamins-Minerals (MULTIVITAMIN ADULTS) TABS Take 1 tablet by mouth daily.   Yes [provider]  RABEprazole (ACIPHEX) 20 MG tablet Take 20 mg by mouth in the morning and at bedtime.    Yes [provider]  simethicone (MYLICON) 976 MG chewable tablet Chew 375 mg by mouth at bedtime. Take 3 tablets (  375 mg) at bedtime   Yes [provider]  sitaGLIPtin-metformin (JANUMET) 50-500 MG tablet Take 2 tablets by mouth daily. 03/20/21  Yes Baxley, Cresenciano Lick, MD  SYNTHROID 137 MCG tablet Take 1 tablet (137 mcg total) by mouth daily before breakfast. Patient taking differently: Take 137 mcg by mouth daily before breakfast. 6 days a week but NOT ON SUNDAYS 03/23/21  Yes Baxley, Cresenciano Lick, MD  tadalafil (CIALIS) 20 MG tablet Take 1 tablet (20 mg total) by mouth daily as needed for erectile dysfunction. 03/20/21  Yes Baxley, Cresenciano Lick, MD  telmisartan-hydrochlorothiazide (MICARDIS HCT) 40-12.5 MG tablet  Take 0.5 tablets by mouth daily. Please discard remaining portion of pill. Patient taking differently: Take 0.25 tablets by mouth daily. Pt takes (0.25 mg) daily. Please discard remaining portion of pill. 03/24/21  Yes Baxley, Cresenciano Lick, MD  VASCEPA 1 g capsule Take 2 capsules (2 g total) by mouth at bedtime. 03/20/21  Yes Baxley, Cresenciano Lick, MD  VICTOZA 18 MG/3ML SOPN Inject 1.2 mg into the skin daily. 03/20/21  Yes Baxley, Cresenciano Lick, MD  Accu-Chek FastClix Lancets MISC Apply topically. 12/24/19   [provider]  atorvastatin (LIPITOR) 80 MG tablet Take 1 tablet (80 mg total) by mouth daily. Patient not taking: Reported on 05/24/2021 03/20/21   Elby Showers, MD  bacitracin ointment Apply 1 application topically 2 (two) times daily. Patient not taking: Reported on 05/24/2021 10/04/20   Varney Biles, MD  Blood Glucose Monitoring Suppl (FIFTY50 GLUCOSE METER 2.0) w/Device KIT See admin instructions. 05/17/19   [provider]  FREESTYLE LITE test strip  12/24/19   [provider]  ondansetron (ZOFRAN ODT) 4 MG disintegrating tablet 36m ODT q4 hours prn nausea/vomit Patient not taking: Reported on 05/24/2021 11/26/20   FDeno Etienne DO  traZODone (DESYREL) 50 MG tablet Take 50 mg by mouth at bedtime. 04/20/21   [provider]  UNIFINE PENTIPS 32G X 4 MM MISC  03/06/20   [provider]      Allergies    Amlodipine, Atenolol, Celecoxib, Lisinopril, Nsaids, and Ranitidine hcl    Review of Systems   Review of Systems  Cardiovascular:  Positive for chest pain.  All other systems reviewed and are negative.  Physical Exam Updated Vital Signs BP 134/68    Pulse (!) 55    Temp 97.6 F (36.4 C)    Resp (!) 22    Ht 5' 5" (1.651 m)    Wt 78.5 kg    SpO2 97%    BMI 28.79 kg/m  Physical Exam Vitals and nursing note reviewed.  HENT:     Head: Normocephalic.  Eyes:     Extraocular Movements: Extraocular movements intact.     Pupils: Pupils are equal, round, and reactive to  light.  Cardiovascular:     Rate and Rhythm: Normal rate. Rhythm irregular.     Heart sounds: Normal heart sounds.     Comments: Slightly irregular heart rate Pulmonary:     Effort: Pulmonary effort is normal.     Breath sounds: Normal breath sounds.  Abdominal:     General: Bowel sounds are normal.     Palpations: Abdomen is soft.  Musculoskeletal:        General: Normal range of motion.     Cervical back: Normal range of motion and neck supple.  Skin:    General: Skin is warm.     Capillary Refill: Capillary refill takes less than 2 seconds.  Neurological:  General: No focal deficit present.     Mental Status: He is alert and oriented to person, place, and time.  Psychiatric:        Mood and Affect: Mood normal.        Behavior: Behavior normal.    ED Results / Procedures / Treatments   Labs (all labs ordered are listed, but only abnormal results are displayed) Labs Reviewed  BASIC METABOLIC PANEL - Abnormal; Notable for the following components:      Result Value   Glucose, Bld 104 (*)    BUN 29 (*)    Creatinine, Ser 1.78 (*)    GFR, Estimated 39 (*)    All other components within normal limits  CBC  TROPONIN I (HIGH SENSITIVITY)  TROPONIN I (HIGH SENSITIVITY)    EKG None  Radiology DG Chest 2 View  Result Date: 05/24/2021 CLINICAL DATA:  Chest pain EXAM: CHEST - 2 VIEW COMPARISON:  None. FINDINGS: Mild atelectasis in the bases. The cardiomediastinal silhouette is stable. No pneumothorax. No nodules or masses. No focal infiltrates. IMPRESSION: No active cardiopulmonary disease. Electronically Signed   By: Dorise Bullion III M.D.   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  Patient states that his heart rate goes up to the 120s today.  Patient has a history of SVT.  Patient took aspirin and still has some chest pressure.  Given his age and risk factors, will consult cardiology  4:03 PM Initial trop negative. Talked to Dr. Harrell Gave from cardiology, who will see patient   6:27 PM Second trop negative.  Dr. Harrell Gave saw patient and states that since the second troponin is negative, we will set up patient for outpatient CT coronary this week.  Gave strict return precautions    Amount and/or Complexity of Data Reviewed Independent Historian: spouse External Data Reviewed: labs, radiology and ECG. Labs: ordered. Radiology: ordered. ECG/medicine tests: ordered and independent interpretation performed.  Risk Decision regarding hospitalization.    Final Clinical Impression(s) / ED Diagnoses Final diagnoses:  None    Rx / DC Orders ED Discharge Orders     None         Drenda Freeze, MD 05/24/21 [phone]

## 2021-05-24 NOTE — Discharge Instructions (Signed)
Your troponins are normal right now   Cardiology office will call you this week to get you in for CT coronaries   Return to ER if you have worse chest pain, shortness of breath, palpitations

## 2021-05-24 NOTE — Patient Instructions (Addendum)
He can see me q 6-12 months and as needed. Tolerating PACs.  No chest pain. To see Dr. Pernell Dupre in January. Diabetes under excellent control and will be see by Dr. Cruzita Lederer for that. Atorvastatin refilled as requested.

## 2021-05-24 NOTE — ED Triage Notes (Signed)
Pt endorses chest pain starting at 4 am. Pt states he had to wear a holter monitor recently and had SVT at times. States it feels like afib today.

## 2021-05-24 NOTE — ED Provider Triage Note (Signed)
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  Pt complains of chest pain.  Patient states that he has had intermittent chest pain for about a month, with significant worsening starting about 4 AM this morning.  He recently had to wear 2-week Holter monitor per his cardiologist, that showed some rounds of SVT.  He states h he feels like his heart is irregular, and he may be in A. fib.  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  Appropriate orders placed.  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  Will obtains labs to r/o ACS   Jannette Cotham T, PA-C 05/24/21 1155

## 2021-05-29 ENCOUNTER — Telehealth (HOSPITAL_COMMUNITY): Payer: Self-pay | Admitting: *Deleted

## 2021-05-29 NOTE — Telephone Encounter (Signed)
Reaching out to patient to offer assistance regarding upcoming cardiac imaging study; pt verbalizes understanding of appt date/time, parking situation and where to check in, pre-test NPO status and verified current allergies; name and call back number provided for further questions should they arise  Gordy Clement RN Navigator Cardiac Imaging [facility] [phone] office [phone] cell  Patient aware to hold cialis for 72 hours prior to cardiac CT and to arrive at 3:30pm for his 4pm scan.

## 2021-06-02 ENCOUNTER — Other Ambulatory Visit: Payer: Self-pay

## 2021-06-02 ENCOUNTER — Ambulatory Visit (HOSPITAL_COMMUNITY)
Admission: RE | Admit: 2021-06-02 | Discharge: 2021-06-02 | Disposition: A | Discharge status: Home / Self Care | Payer: 59 | Source: Ambulatory Visit | Attending: Cardiology | Admitting: Cardiology

## 2021-06-02 DIAGNOSIS — I2510 Atherosclerotic heart disease of native coronary artery without angina pectoris: Secondary | ICD-10-CM

## 2021-06-02 DIAGNOSIS — R072 Precordial pain: Secondary | ICD-10-CM | POA: Diagnosis not present

## 2021-06-02 DIAGNOSIS — R931 Abnormal findings on diagnostic imaging of heart and coronary circulation: Secondary | ICD-10-CM | POA: Diagnosis not present

## 2021-06-02 MED ORDER — IOHEXOL 350 MG/ML SOLN
80.0000 mL | Freq: Once | INTRAVENOUS | Status: AC | PRN
  Administered 2021-06-02: 80 mL via INTRAVENOUS

## 2021-06-02 MED ORDER — NITROGLYCERIN 0.4 MG SL SUBL: 0.8000 mg | SUBLINGUAL_TABLET | Freq: Once | SUBLINGUAL | Status: AC

## 2021-06-02 MED ORDER — NITROGLYCERIN 0.4 MG SL SUBL
SUBLINGUAL_TABLET | SUBLINGUAL | Status: AC
  Administered 2021-06-02: 0.8 mg via SUBLINGUAL
  Filled 2021-06-02: qty 2

## 2021-06-03 ENCOUNTER — Ambulatory Visit (HOSPITAL_COMMUNITY)
Admission: RE | Admit: 2021-06-03 | Discharge: 2021-06-03 | Disposition: A | Discharge status: Home / Self Care | Payer: 59 | Source: Ambulatory Visit | Attending: Cardiology | Admitting: Cardiology

## 2021-06-03 ENCOUNTER — Other Ambulatory Visit (HOSPITAL_BASED_OUTPATIENT_CLINIC_OR_DEPARTMENT_OTHER): Payer: Self-pay | Admitting: Cardiology

## 2021-06-03 DIAGNOSIS — R072 Precordial pain: Secondary | ICD-10-CM | POA: Diagnosis not present

## 2021-06-03 DIAGNOSIS — I2510 Atherosclerotic heart disease of native coronary artery without angina pectoris: Secondary | ICD-10-CM | POA: Diagnosis not present

## 2021-06-03 DIAGNOSIS — R931 Abnormal findings on diagnostic imaging of heart and coronary circulation: Secondary | ICD-10-CM | POA: Insufficient documentation

## 2021-06-03 NOTE — H&P (View-Only) (Signed)
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 Did not keep office appointment for same in November 2022.  ER visit for prolonged CP 05/24/2021. C/o heart rate spontaneously > 120 bpm. Troponin I negative x 2. Set up for OP Coronary CTA 06/02/2021 with results below.  Gibril has a history as noted above.  The CTA is reassuring however he continues to have intermittent episodes of left subclavicular and parasternal discomfort, even during our conversation today.  There is no exertional related discomfort.  No prior history of coronary disease prior to this 2 to 3-week timeframe.  Does have a history of pericarditis in the past.  He has CKD, diabetes mellitus for 20 years, obstructive sleep apnea, and hypertension.  Past Medical History:  Diagnosis Date   Allergy    Arthritis    Diabetes mellitus (HCC)    GERD (gastroesophageal reflux disease)    Gout    HOH (hard of hearing)    Hx of skin cancer, basal cell    Hyperlipemia    Hypertension    Hypothyroidism    Obstructive sleep apnea 11/07/2015   Pneumonia    Prostate cancer ([place])    Renal disorder    mild renal insufficiency   Rupture quadriceps tendon 09-21-13   left   Seizures ([place]) 2000   only one time-never dx why-no meds   Skin cancer    Sleep apnea    Thyroid disease    hypothyroidism   Wears glasses     Past Surgical History:  Procedure Laterality Date   COLONOSCOPY     FACIAL FRACTURE SURGERY  1965   KNEE ARTHROSCOPY Left 09/26/2013   Procedure: LEFT KNEE  ARTHROSCOPY WITH DEBRIDEMENT/SHAVING (CHONDROPLASTY), SUTURE REPAIR QUADRICEPS/HAMSTRING MUSCLE RUPTURE PRIMARY;  Surgeon: Lorn Junes, MD;  Location: [facility];  Service: Orthopedics;  Laterality: Left;   KNEE SURGERY     left   LYMPHADENECTOMY Bilateral 05/05/2020   Procedure: LYMPHADENECTOMY, PELVIC;  Surgeon: Raynelle Bring, MD;  Location: [facility];  Service: Urology;  Laterality: Bilateral;   PROSTATE BIOPSY     QUADRICEPS TENDON REPAIR Left 09/26/2013   Procedure: REPAIR QUADRICEP TENDON;  Surgeon: Lorn Junes, MD;  Location: [facility];  Service: Orthopedics;  Laterality: Left;   ROBOT ASSISTED LAPAROSCOPIC RADICAL PROSTATECTOMY N/A 05/05/2020   Procedure: XI ROBOTIC ASSISTED LAPAROSCOPIC RADICAL PROSTATECTOMY LEVEL 2;  Surgeon: Raynelle Bring, MD;  Location: [facility];  Service: Urology;  Laterality: N/A;   TONSILLECTOMY      Current Medications: Current Meds  Medication Sig   Accu-Chek FastClix Lancets MISC Apply topically.   acetaminophen (TYLENOL) 500 MG tablet Take 1,000 mg by mouth at bedtime.    albuterol (VENTOLIN HFA) 108 (90 Base) MCG/ACT inhaler Inhale 2 puffs into the lungs every 6 (six) hours as needed for wheezing or shortness of breath.    allopurinol (ZYLOPRIM) 300 MG tablet Take 1 tablet (300 mg total) by  mouth daily.   atorvastatin (LIPITOR) 80 MG tablet Take 1 tablet (80 mg total) by mouth daily.   atorvastatin (LIPITOR) 80 MG tablet Take 1 tablet (80 mg total) by mouth daily.   azelastine (ASTELIN) 0.1 % nasal spray 1-2 puffs each nostril once or twice daily as needed (Patient taking differently: Place 2 sprays into both nostrils daily as needed for rhinitis.)   bacitracin ointment Apply 1 application topically 2 (two) times daily.   benzonatate (TESSALON) 100 MG capsule Take 1 capsule (100 mg total) by mouth every 8 (eight) hours. (Patient taking differently: Take 100 mg by mouth 3 (three) times daily as needed for cough.)   Blood  Glucose Monitoring Suppl (FIFTY50 GLUCOSE METER 2.0) w/Device KIT See admin instructions.   calcium carbonate (TUMS EX) 750 MG chewable tablet Chew 2 tablets by mouth at bedtime.    Cholecalciferol 1.25 MG (50000 UT) TABS Take 50,000 Units by mouth once a week. (Patient taking differently: Take 50,000 Units by mouth once a week. Take on Sunday)   ezetimibe (ZETIA) 10 MG tablet Take 1 tablet (10 mg total) by mouth daily.   FORTESTA 10 MG/ACT (2%) GEL Apply 2 application topically daily.   FREESTYLE LITE test strip    hydrOXYzine (ATARAX/VISTARIL) 25 MG tablet Take 1 tablet (25 mg total) by mouth at bedtime.   ketoconazole (NIZORAL) 2 % cream Apply 1 application topically daily as needed for irritation.   minoxidil (ROGAINE) 2 % external solution Apply 1 application topically daily.   Multiple Vitamins-Minerals (MULTIVITAMIN ADULTS) TABS Take 1 tablet by mouth daily.   ondansetron (ZOFRAN ODT) 4 MG disintegrating tablet 80m ODT q4 hours prn nausea/vomit   RABEprazole (ACIPHEX) 20 MG tablet Take 20 mg by mouth in the morning and at bedtime.    simethicone (MYLICON) 1035MG chewable tablet Chew 375 mg by mouth at bedtime. Take 3 tablets (375 mg) at bedtime   sitaGLIPtin-metformin (JANUMET) 50-500 MG tablet Take 2 tablets by mouth daily.   SYNTHROID 137 MCG tablet Take 1 tablet (137 mcg total) by mouth daily before breakfast. (Patient taking differently: Take 137 mcg by mouth daily before breakfast. 6 days a week but NOT ON SUNDAYS)   tadalafil (CIALIS) 20 MG tablet Take 1 tablet (20 mg total) by mouth daily as needed for erectile dysfunction.   telmisartan-hydrochlorothiazide (MICARDIS HCT) 40-12.5 MG tablet Take 0.5 tablets by mouth daily. Please discard remaining portion of pill. (Patient taking differently: Take 0.25 tablets by mouth daily. Pt takes (0.25 mg) daily. Please discard remaining portion of pill.)   UNIFINE PENTIPS 32G X 4 MM MISC    VASCEPA 1 g capsule Take 2 capsules (2 g total) by mouth  at bedtime.   VICTOZA 18 MG/3ML SOPN Inject 1.2 mg into the skin daily.   [DISCONTINUED] metoprolol succinate (TOPROL XL) 25 MG 24 hr tablet Take 0.5 tablets (12.5 mg total) by mouth daily.   [DISCONTINUED] nitroGLYCERIN (NITROSTAT) 0.4 MG SL tablet Place 1 tablet (0.4 mg total) under the tongue every 5 (five) minutes as needed for chest pain.     Allergies:   Amlodipine, Atenolol, Celecoxib, Lisinopril, Nsaids, and Ranitidine hcl   Social History   Socioeconomic History   Marital status: Married    Spouse name: BJaggar Benko RN    Number of children: 2   Years of education: Not on file   Highest education level: Not on file  Occupational History    Comment: pediatric and adult endocrinologist  Tobacco Use  status: Former    Packs/day: 0.50    Years: 19.00    Pack years: 9.50    Types: Cigarettes    Quit date: 09/21/1972    Years since quitting: 48.7   Smokeless tobacco: Never  Vaping Use   Vaping Use: Never used  Substance and Sexual Activity   Alcohol use: No    Alcohol/week: 14.0 standard drinks    Types: 7 Glasses of wine, 7 Shots of liquor per week    Comment: no longer drinks   Drug use: No   Sexual activity: Not Currently  Other Topics Concern   Not on file  Social History Narrative   Not on file   Social Determinants of Health   Financial Resource Strain: Not on file  Food Insecurity: Not on file  Transportation Needs: Not on file  Physical Activity: Not on file  Stress: Not on file  Social Connections: Not on file     Family History: The patient's family history includes Breast cancer in his mother and sister; Diabetes in an other family member; Heart disease in his father and mother; Hypertension in an other family member. There is no history of Esophageal cancer, Rectal cancer, Stomach cancer, Prostate cancer, Pancreatic cancer, or Colon cancer.  ROS:   Please see the history of present illness.    He has multiple issues including prior  history of Hashimoto's thyrotoxicosis, diabetes mellitus type 2, hyperlipidemia, and CKD.  All other systems reviewed and are negative.  EKGs/Labs/Other Studies Reviewed:    The following studies were reviewed today:  Coronary CTA 06/02/2021: IMPRESSION: 1. Severe CAD, CADRADS = 4. Ostial stenosis of D1 appears 70-99%, but small caliber vessel. Otherwise there appears to be nonobstructive CAD in the main coronary vessels. CT FFR will be performed and reported separately.   2. Coronary calcium score of 128. This was 36th percentile for age and sex matched control.   3. Normal coronary origin with left dominance.  CARDIAC MONITOR 04/28/2022: Study Highlights    NSR Tachycardia Bradycardia syndrome No atrial fibrillation No excessive pauses No ventricular tachycardia Frequent PAC's Frequent non-sustained ectopic atrial tachycard that correlates with symptoms/events     Patch Wear Time:  14 days and 0 hours (2022-11-20T12:57:18-0500 to 2022-12-04T12:57:22-0500)   Patient had a min HR of 40 bpm, max HR of 171 bpm, and avg HR of 68 bpm. Predominant underlying rhythm was Sinus Rhythm. 944 Supraventricular Tachycardia runs occurred, the run with the fastest interval lasting 10 beats with a max rate of 171 bpm, the  longest lasting 35.4 secs with an avg rate of 129 bpm. Supraventricular Tachycardia was detected within +/- 45 seconds of symptomatic patient event(s). Isolated SVEs were frequent (10.8%, [id number]), SVE Couplets were occasional (1.3%, 8796), and SVE  Triplets were rare (<1.0%, 3706). Isolated VEs were rare (<1.0%), VE Couplets were rare (<1.0%), and no VE Triplets were present.    EKG:  EKG not performed today.  Recent Labs: 04/15/2021: ALT 22; TSH 2.33 05/24/2021: BUN 29; Creatinine, Ser 1.78; Hemoglobin 15.5; Platelets 204; Potassium 4.3; Sodium 142  Recent Lipid Panel    Component Value Date/Time   CHOL 256 (H) 04/15/2021 0820   TRIG 220 (H) 04/15/2021 0820   HDL 56  04/15/2021 0820   CHOLHDL 4.6 04/15/2021 0820   LDLCALC 161 (H) 04/15/2021 0820    Physical Exam:    VS:  BP (!) 128/58    Pulse 68    Ht _0  (1.651 m)    Wt 179  179 lb 9.6 oz (81.5 kg)    SpO2 98%    BMI 29.89 kg/m     Wt Readings from Last 3 Encounters:  06/05/21 179 lb 9.6 oz (81.5 kg)  05/24/21 173 lb (78.5 kg)  04/17/21 178 lb (80.7 kg)     GEN: Slightly overweight. No acute distress HEENT: Normal NECK: No JVD. LYMPHATICS: No lymphadenopathy CARDIAC: 2/6 left upper sternal systolic murmur. Irregular RR no gallop, or edema. VASCULAR:  Normal Pulses. No bruits. RESPIRATORY:  Clear to auscultation without rales, wheezing or rhonchi  ABDOMEN: Soft, non-tender, non-distended, No pulsatile mass, MUSCULOSKELETAL: No deformity  SKIN: Warm and dry NEUROLOGIC:  Alert and oriented x 3 PSYCHIATRIC:  Normal affect   ASSESSMENT:    1. Palpitations   2. Chest pain of uncertain etiology   3. Hyperlipidemia, unspecified hyperlipidemia type   4. Primary hypertension   5. Type 2 diabetes mellitus with stage 3a chronic kidney disease, without long-term current use of insulin ([place])   6. Obstructive sleep apnea   7. Coronary artery disease of native artery of native heart with stable angina pectoris ([place])    PLAN:    In order of problems listed above:  Documentation of PACs and nonsustained atrial tachycardia on recent monitor.  No sustained episodes. Recent prolonged discomfort with negative cardiac markers in the setting of intermittent tachycardia and palpitations.  Complains of having brief episodes of focal left subclavicular discomfort this morning.  None with physical activity.  We do know of coronary disease by the recent CTA which demonstrated ostial second diagonal disease.  Given symptoms I feel coronary angiography should be performed.  Risks and benefits discussed in detail with patient and wife.  Start metoprolol succinate 12.5 mg daily and prescription for sublingual  nitroglycerin is given. Continue aggressive lipid-lowering. Consider SGLT2 therapy for renal and cardiac protection Continue current therapy Encourage compliant with CPAP See coronary CTA result.  The patient was counseled to undergo left heart catheterization, coronary angiography, and possible percutaneous coronary intervention with stent implantation. The procedural risks and benefits were discussed in detail. The risks discussed included death, stroke, myocardial infarction, life-threatening bleeding, limb ischemia, kidney injury, allergy, and possible emergency cardiac surgery. The risk of these significant complications were estimated to occur less than 1% of the time. After discussion, the patient has agreed to proceed.  Scheduled for Tuesday with Dr. [name]  Medication Adjustments/Labs and Tests Ordered: Current medicines are reviewed at length with the patient today.  Concerns regarding medicines are outlined above.  Orders Placed This Encounter  Procedures   Basic metabolic panel   CBC   Meds ordered this encounter  Medications   DISCONTD: metoprolol succinate (TOPROL XL) 25 MG 24 hr tablet    Sig: Take 0.5 tablets (12.5 mg total) by mouth daily.    Dispense:  45 tablet    Refill:  3   DISCONTD: nitroGLYCERIN (NITROSTAT) 0.4 MG SL tablet    Sig: Place 1 tablet (0.4 mg total) under the tongue every 5 (five) minutes as needed for chest pain.    Dispense:  25 tablet    Refill:  3   DISCONTD: nitroGLYCERIN (NITROSTAT) 0.4 MG SL tablet    Sig: Place 1 tablet (0.4 mg total) under the tongue every 5 (five) minutes as needed for chest pain.    Dispense:  25 tablet    Refill:  3    Please place on hold.  Patient picking up locally.   DISCONTD: metoprolol succinate (TOPROL XL)  25 MG 24 hr tablet    Sig: Take 0.5 tablets (12.5 mg total) by mouth daily.    Dispense:  45 tablet    Refill:  3    Please place on hold.  Patient picking up locally.   nitroGLYCERIN (NITROSTAT) 0.4  MG SL tablet    Sig: Place 1 tablet (0.4 mg total) under the tongue every 5 (five) minutes as needed for chest pain.    Dispense:  25 tablet    Refill:  3    Please place on hold.  Patient picking up locally.   metoprolol succinate (TOPROL XL) 25 MG 24 hr tablet    Sig: Take 0.5 tablets (12.5 mg total) by mouth daily.    Dispense:  45 tablet    Refill:  3    Please place on hold.  Patient picking up locally.    Patient Instructions  Medication Instructions:  1) START Metoprolol Succinate 12.88m once daily 2)  A prescription has been sent in for Nitroglycerin.  If you have chest pain that doesn't relieve quickly, place one tablet under your tongue and allow it to dissolve.  If no relief after 5 minutes, you may take another pill.  If no relief after 5 minutes, you may take a 3rd dose but you need to call 911 and report to ER immediately.  *If you need a refill on your cardiac medications before your next appointment, please call your pharmacy*   Lab Work: BMET and CBC today  If you have labs (blood work) drawn today and your tests are completely normal, you will receive your results only by: [facility](if you have MyChart) OR A paper copy in the mail If you have any lab test that is abnormal or we need to change your treatment, we will call you to review the results.   Testing/Procedures: Your physician has requested that you have a cardiac catheterization. Cardiac catheterization is used to diagnose and/or treat various heart conditions. Doctors may recommend this procedure for a number of different reasons. The most common reason is to evaluate chest pain. Chest pain can be a symptom of coronary artery disease (CAD), and cardiac catheterization can show whether plaque is narrowing or blocking your hearts arteries. This procedure is also used to evaluate the valves, as well as measure the blood flow and oxygen levels in different parts of your heart. For further information  please visit [email or web address] Please follow instruction sheet, as given.   Follow-Up: [facility] and your [facility] needs are our [facility].  As part of our continuing mission to provide you with exceptional heart care, we have created designated Provider Care Teams.  These Care Teams include your primary Cardiologist (physician) and Advanced Practice Providers (APPs -  Physician Assistants and Nurse Practitioners) who all work together to provide you with the care you need, when you need it.  We recommend signing up for the patient portal called "MyChart".  Sign up information is provided on this After Visit Summary.  MyChart is used to connect with patients for Virtual Visits (Telemedicine).  Patients are able to view lab/test results, encounter notes, upcoming appointments, etc.  Non-urgent messages can be sent to your provider as well.   To learn more about what you can do with MyChart, go to [email or web address]    Your next appointment:   2-3 week(s)  The format for your next appointment:   In Person  Provider:   HSinclair Grooms MD  Other Instructions   [facility] OFFICE [place], [place] [address] Dept: [phone] Loc: [phone]  Peter Hock, MD  06/05/2021  You are scheduled for a Cardiac Catheterization on Tuesday, January 24 with Dr. [name].  1. Please arrive at the [facility] (Main Entrance A) at [facility]: [address] [place], [place] [address] at 5:30 AM (This time is two hours before your procedure to ensure your preparation). Free valet parking service is available.   Special note: Every effort is made to have your procedure done on time. Please understand that emergencies sometimes delay scheduled procedures.  2. Diet: Do not eat solid foods after midnight.  The patient may have clear liquids until 5am upon the day  of the procedure.  3. Labs: You will have labs drawn today.  4. Medication instructions in preparation for your procedure:   Contrast Allergy: No  You will need to hold your Telmisartan/HCTZ the day before and the morning of your procedure.  You will need to hold your Victoza the morning of your procedure.  Do not take your Janumet the morning of your procedure and continue to hold for 48 hours after.   On the morning of your procedure, take your Aspirin and any morning medicines NOT listed above.  You may use sips of water.  5. Plan for one night stay--bring personal belongings. 6. Bring a current list of your medications and current insurance cards. 7. You MUST have a responsible person to drive you home. 8. Someone MUST be with you the first 24 hours after you arrive home or your discharge will be delayed. 9. Please wear clothes that are easy to get on and off and wear slip-on shoes.  Thank you for allowing [place] to care for you!   -- [facility]     Signed, Sinclair Grooms, MD  06/05/2021 8:57 AM    [place]

## 2021-06-03 NOTE — Progress Notes (Signed)
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 Did not keep office appointment for same in November 2022. ° °ER visit for prolonged CP 05/24/2021. C/o heart rate spontaneously > 120 bpm. Troponin I negative x 2. Set up for OP Coronary CTA 06/02/2021 with results below. ° °Emani has a history as noted above.  The CTA is reassuring however he continues to have intermittent episodes of left subclavicular and parasternal discomfort, even during our conversation today.  There is no exertional related discomfort.  No prior history of coronary disease prior to this 2 to 3-week timeframe.  Does have a history of pericarditis in the past. ° °He has CKD, diabetes mellitus for 20 years, obstructive sleep apnea, and hypertension. ° °Past Medical History:  °Diagnosis Date  ° Allergy   ° Arthritis   ° Diabetes mellitus (HCC)   ° GERD (gastroesophageal reflux disease)   ° Gout   ° HOH (hard of hearing)   ° Hx of skin cancer, basal cell   ° Hyperlipemia   ° Hypertension   ° Hypothyroidism   ° Obstructive sleep apnea 11/07/2015  ° Pneumonia   ° Prostate cancer (HCC)   ° Renal disorder   ° mild renal insufficiency  ° Rupture quadriceps tendon 09-21-13  ° left  ° Seizures (HCC) 2000  ° only one time-never dx why-no meds  ° Skin cancer   ° Sleep apnea   ° Thyroid disease   ° hypothyroidism  ° Wears glasses   ° ° °Past Surgical History:  °Procedure Laterality Date  ° COLONOSCOPY    ° FACIAL FRACTURE SURGERY  1965  ° KNEE ARTHROSCOPY Left 09/26/2013  ° Procedure: LEFT KNEE  ARTHROSCOPY WITH DEBRIDEMENT/SHAVING (CHONDROPLASTY), SUTURE REPAIR QUADRICEPS/HAMSTRING MUSCLE RUPTURE PRIMARY;  Surgeon: Robert A Wainer, MD;  Location: [facility];  Service: Orthopedics;  Laterality: Left;  ° KNEE SURGERY    ° left  ° LYMPHADENECTOMY Bilateral 05/05/2020  ° Procedure: LYMPHADENECTOMY, PELVIC;  Surgeon: Borden, Lester, MD;  Location: [facility];  Service: Urology;  Laterality: Bilateral;  ° PROSTATE BIOPSY    ° QUADRICEPS TENDON REPAIR Left 09/26/2013  ° Procedure: REPAIR QUADRICEP TENDON;  Surgeon: Robert A Wainer, MD;  Location: [facility];  Service: Orthopedics;  Laterality: Left;  ° ROBOT ASSISTED LAPAROSCOPIC RADICAL PROSTATECTOMY N/A 05/05/2020  ° Procedure: XI ROBOTIC ASSISTED LAPAROSCOPIC RADICAL PROSTATECTOMY LEVEL 2;  Surgeon: Borden, Lester, MD;  Location: [facility];  Service: Urology;  Laterality: N/A;  ° TONSILLECTOMY    ° ° °Current Medications: °Current Meds  °Medication Sig  ° Accu-Chek FastClix Lancets MISC Apply topically.  ° acetaminophen (TYLENOL) 500 MG tablet Take 1,000 mg by mouth at bedtime.   ° albuterol (VENTOLIN HFA) 108 (90 Base) MCG/ACT inhaler Inhale 2 puffs into the lungs every 6 (six) hours as needed for wheezing or shortness of breath.   ° allopurinol (ZYLOPRIM) 300 MG tablet Take 1 tablet (300 mg total) by mouth   mouth daily.   atorvastatin (LIPITOR) 80 MG tablet Take 1 tablet (80 mg total) by mouth daily.   atorvastatin (LIPITOR) 80 MG tablet Take 1 tablet (80 mg total) by mouth daily.   azelastine (ASTELIN) 0.1 % nasal spray 1-2 puffs each nostril once or twice daily as needed (Patient taking differently: Place 2 sprays into both nostrils daily as needed for rhinitis.)   bacitracin ointment Apply 1 application topically 2 (two) times daily.   benzonatate (TESSALON) 100 MG capsule Take 1 capsule (100 mg total) by mouth every 8 (eight) hours. (Patient taking differently: Take 100 mg by mouth 3 (three) times daily as needed for cough.)   Blood  Glucose Monitoring Suppl (FIFTY50 GLUCOSE METER 2.0) w/Device KIT See admin instructions.   calcium carbonate (TUMS EX) 750 MG chewable tablet Chew 2 tablets by mouth at bedtime.    Cholecalciferol 1.25 MG (50000 UT) TABS Take 50,000 Units by mouth once a week. (Patient taking differently: Take 50,000 Units by mouth once a week. Take on Sunday)   ezetimibe (ZETIA) 10 MG tablet Take 1 tablet (10 mg total) by mouth daily.   FORTESTA 10 MG/ACT (2%) GEL Apply 2 application topically daily.   FREESTYLE LITE test strip    hydrOXYzine (ATARAX/VISTARIL) 25 MG tablet Take 1 tablet (25 mg total) by mouth at bedtime.   ketoconazole (NIZORAL) 2 % cream Apply 1 application topically daily as needed for irritation.   minoxidil (ROGAINE) 2 % external solution Apply 1 application topically daily.   Multiple Vitamins-Minerals (MULTIVITAMIN ADULTS) TABS Take 1 tablet by mouth daily.   ondansetron (ZOFRAN ODT) 4 MG disintegrating tablet 80m ODT q4 hours prn nausea/vomit   RABEprazole (ACIPHEX) 20 MG tablet Take 20 mg by mouth in the morning and at bedtime.    simethicone (MYLICON) 1035MG chewable tablet Chew 375 mg by mouth at bedtime. Take 3 tablets (375 mg) at bedtime   sitaGLIPtin-metformin (JANUMET) 50-500 MG tablet Take 2 tablets by mouth daily.   SYNTHROID 137 MCG tablet Take 1 tablet (137 mcg total) by mouth daily before breakfast. (Patient taking differently: Take 137 mcg by mouth daily before breakfast. 6 days a week but NOT ON SUNDAYS)   tadalafil (CIALIS) 20 MG tablet Take 1 tablet (20 mg total) by mouth daily as needed for erectile dysfunction.   telmisartan-hydrochlorothiazide (MICARDIS HCT) 40-12.5 MG tablet Take 0.5 tablets by mouth daily. Please discard remaining portion of pill. (Patient taking differently: Take 0.25 tablets by mouth daily. Pt takes (0.25 mg) daily. Please discard remaining portion of pill.)   UNIFINE PENTIPS 32G X 4 MM MISC    VASCEPA 1 g capsule Take 2 capsules (2 g total) by mouth  at bedtime.   VICTOZA 18 MG/3ML SOPN Inject 1.2 mg into the skin daily.   [DISCONTINUED] metoprolol succinate (TOPROL XL) 25 MG 24 hr tablet Take 0.5 tablets (12.5 mg total) by mouth daily.   [DISCONTINUED] nitroGLYCERIN (NITROSTAT) 0.4 MG SL tablet Place 1 tablet (0.4 mg total) under the tongue every 5 (five) minutes as needed for chest pain.     Allergies:   Amlodipine, Atenolol, Celecoxib, Lisinopril, Nsaids, and Ranitidine hcl   Social History   Socioeconomic History   Marital status: Married    Spouse name: BJaggar Benko RN    Number of children: 2   Years of education: Not on file   Highest education level: Not on file  Occupational History    Comment: pediatric and adult endocrinologist  Tobacco Use  status: Former    Packs/day: 0.50    Years: 19.00    Pack years: 9.50    Types: Cigarettes    Quit date: 09/21/1972    Years since quitting: 48.7   Smokeless tobacco: Never  Vaping Use   Vaping Use: Never used  Substance and Sexual Activity   Alcohol use: No    Alcohol/week: 14.0 standard drinks    Types: 7 Glasses of wine, 7 Shots of liquor per week    Comment: no longer drinks   Drug use: No   Sexual activity: Not Currently  Other Topics Concern   Not on file  Social History Narrative   Not on file   Social Determinants of Health   Financial Resource Strain: Not on file  Food Insecurity: Not on file  Transportation Needs: Not on file  Physical Activity: Not on file  Stress: Not on file  Social Connections: Not on file     Family History: The patient's family history includes Breast cancer in his mother and sister; Diabetes in an other family member; Heart disease in his father and mother; Hypertension in an other family member. There is no history of Esophageal cancer, Rectal cancer, Stomach cancer, Prostate cancer, Pancreatic cancer, or Colon cancer.  ROS:   Please see the history of present illness.    He has multiple issues including prior  history of Hashimoto's thyrotoxicosis, diabetes mellitus type 2, hyperlipidemia, and CKD.  All other systems reviewed and are negative.  EKGs/Labs/Other Studies Reviewed:    The following studies were reviewed today:  Coronary CTA 06/02/2021: IMPRESSION: 1. Severe CAD, CADRADS = 4. Ostial stenosis of D1 appears 70-99%, but small caliber vessel. Otherwise there appears to be nonobstructive CAD in the main coronary vessels. CT FFR will be performed and reported separately.   2. Coronary calcium score of 128. This was 36th percentile for age and sex matched control.   3. Normal coronary origin with left dominance.  CARDIAC MONITOR 04/28/2022: Study Highlights    NSR Tachycardia Bradycardia syndrome No atrial fibrillation No excessive pauses No ventricular tachycardia Frequent PAC's Frequent non-sustained ectopic atrial tachycard that correlates with symptoms/events     Patch Wear Time:  14 days and 0 hours (2022-11-20T12:57:18-0500 to 2022-12-04T12:57:22-0500)   Patient had a min HR of 40 bpm, max HR of 171 bpm, and avg HR of 68 bpm. Predominant underlying rhythm was Sinus Rhythm. 944 Supraventricular Tachycardia runs occurred, the run with the fastest interval lasting 10 beats with a max rate of 171 bpm, the  longest lasting 35.4 secs with an avg rate of 129 bpm. Supraventricular Tachycardia was detected within +/- 45 seconds of symptomatic patient event(s). Isolated SVEs were frequent (10.8%, [id number]), SVE Couplets were occasional (1.3%, 8796), and SVE  Triplets were rare (<1.0%, 3706). Isolated VEs were rare (<1.0%), VE Couplets were rare (<1.0%), and no VE Triplets were present.    EKG:  EKG not performed today.  Recent Labs: 04/15/2021: ALT 22; TSH 2.33 05/24/2021: BUN 29; Creatinine, Ser 1.78; Hemoglobin 15.5; Platelets 204; Potassium 4.3; Sodium 142  Recent Lipid Panel    Component Value Date/Time   CHOL 256 (H) 04/15/2021 0820   TRIG 220 (H) 04/15/2021 0820   HDL 56  04/15/2021 0820   CHOLHDL 4.6 04/15/2021 0820   LDLCALC 161 (H) 04/15/2021 0820    Physical Exam:    VS:  BP (!) 128/58    Pulse 68    Ht _0  (1.651 m)    Wt 179  lb 9.6 oz (81.5 kg)    SpO2 98%    BMI 29.89 kg/m²    ° °Wt Readings from Last 3 Encounters:  °06/05/21 179 lb 9.6 oz (81.5 kg)  °05/24/21 173 lb (78.5 kg)  °04/17/21 178 lb (80.7 kg)  °  ° °GEN: Slightly overweight. No acute distress °HEENT: Normal °NECK: No JVD. °LYMPHATICS: No lymphadenopathy °CARDIAC: 2/6 left upper sternal systolic murmur. Irregular RR no gallop, or edema. °VASCULAR:  Normal Pulses. No bruits. °RESPIRATORY:  Clear to auscultation without rales, wheezing or rhonchi  °ABDOMEN: Soft, non-tender, non-distended, No pulsatile mass, °MUSCULOSKELETAL: No deformity  °SKIN: Warm and dry °NEUROLOGIC:  Alert and oriented x 3 °PSYCHIATRIC:  Normal affect  ° °ASSESSMENT:   ° °1. Palpitations   °2. Chest pain of uncertain etiology   °3. Hyperlipidemia, unspecified hyperlipidemia type   °4. Primary hypertension   °5. Type 2 diabetes mellitus with stage 3a chronic kidney disease, without long-term current use of insulin (HCC)   °6. Obstructive sleep apnea   °7. Coronary artery disease of native artery of native heart with stable angina pectoris (HCC)   ° °PLAN:   ° °In order of problems listed above: ° °Documentation of PACs and nonsustained atrial tachycardia on recent monitor.  No sustained episodes. °Recent prolonged discomfort with negative cardiac markers in the setting of intermittent tachycardia and palpitations.  Complains of having brief episodes of focal left subclavicular discomfort this morning.  None with physical activity.  We do know of coronary disease by the recent CTA which demonstrated ostial second diagonal disease.  Given symptoms I feel coronary angiography should be performed.  Risks and benefits discussed in detail with patient and wife.  Start metoprolol succinate 12.5 mg daily and prescription for sublingual  nitroglycerin is given. °Continue aggressive lipid-lowering. °Consider SGLT2 therapy for renal and cardiac protection °Continue current therapy °Encourage compliant with CPAP °See coronary CTA result. ° °The patient was counseled to undergo left heart catheterization, coronary angiography, and possible percutaneous coronary intervention with stent implantation. The procedural risks and benefits were discussed in detail. The risks discussed included death, stroke, myocardial infarction, life-threatening bleeding, limb ischemia, kidney injury, allergy, and possible emergency cardiac surgery. The risk of these significant complications were estimated to occur less than 1% of the time. After discussion, the patient has agreed to proceed. ° °Scheduled for Tuesday with Dr. Thomas Kelly ° °Medication Adjustments/Labs and Tests Ordered: °Current medicines are reviewed at length with the patient today.  Concerns regarding medicines are outlined above.  °Orders Placed This Encounter  °Procedures  ° Basic metabolic panel  ° CBC  ° °Meds ordered this encounter  °Medications  ° DISCONTD: metoprolol succinate (TOPROL XL) 25 MG 24 hr tablet  °  Sig: Take 0.5 tablets (12.5 mg total) by mouth daily.  °  Dispense:  45 tablet  °  Refill:  3  ° DISCONTD: nitroGLYCERIN (NITROSTAT) 0.4 MG SL tablet  °  Sig: Place 1 tablet (0.4 mg total) under the tongue every 5 (five) minutes as needed for chest pain.  °  Dispense:  25 tablet  °  Refill:  3  ° DISCONTD: nitroGLYCERIN (NITROSTAT) 0.4 MG SL tablet  °  Sig: Place 1 tablet (0.4 mg total) under the tongue every 5 (five) minutes as needed for chest pain.  °  Dispense:  25 tablet  °  Refill:  3  °  Please place on hold.  Patient picking up locally.  ° DISCONTD: metoprolol succinate (TOPROL XL) 25   25 MG 24 hr tablet    Sig: Take 0.5 tablets (12.5 mg total) by mouth daily.    Dispense:  45 tablet    Refill:  3    Please place on hold.  Patient picking up locally.   nitroGLYCERIN (NITROSTAT) 0.4  MG SL tablet    Sig: Place 1 tablet (0.4 mg total) under the tongue every 5 (five) minutes as needed for chest pain.    Dispense:  25 tablet    Refill:  3    Please place on hold.  Patient picking up locally.   metoprolol succinate (TOPROL XL) 25 MG 24 hr tablet    Sig: Take 0.5 tablets (12.5 mg total) by mouth daily.    Dispense:  45 tablet    Refill:  3    Please place on hold.  Patient picking up locally.    Patient Instructions  Medication Instructions:  1) START Metoprolol Succinate 12.88m once daily 2)  A prescription has been sent in for Nitroglycerin.  If you have chest pain that doesn't relieve quickly, place one tablet under your tongue and allow it to dissolve.  If no relief after 5 minutes, you may take another pill.  If no relief after 5 minutes, you may take a 3rd dose but you need to call 911 and report to ER immediately.  *If you need a refill on your cardiac medications before your next appointment, please call your pharmacy*   Lab Work: BMET and CBC today  If you have labs (blood work) drawn today and your tests are completely normal, you will receive your results only by: [place](if you have MyChart) OR A paper copy in the mail If you have any lab test that is abnormal or we need to change your treatment, we will call you to review the results.   Testing/Procedures: Your physician has requested that you have a cardiac catheterization. Cardiac catheterization is used to diagnose and/or treat various heart conditions. Doctors may recommend this procedure for a number of different reasons. The most common reason is to evaluate chest pain. Chest pain can be a symptom of coronary artery disease (CAD), and cardiac catheterization can show whether plaque is narrowing or blocking your hearts arteries. This procedure is also used to evaluate the valves, as well as measure the blood flow and oxygen levels in different parts of your heart. For further information  please visit [email or web address] Please follow instruction sheet, as given.   Follow-Up: [facility] and your [facility] needs are our [facility].  As part of our continuing mission to provide you with exceptional heart care, we have created designated Provider Care Teams.  These Care Teams include your primary Cardiologist (physician) and Advanced Practice Providers (APPs -  Physician Assistants and Nurse Practitioners) who all work together to provide you with the care you need, when you need it.  We recommend signing up for the patient portal called "MyChart".  Sign up information is provided on this After Visit Summary.  MyChart is used to connect with patients for Virtual Visits (Telemedicine).  Patients are able to view lab/test results, encounter notes, upcoming appointments, etc.  Non-urgent messages can be sent to your provider as well.   To learn more about what you can do with MyChart, go to [email or web address]    Your next appointment:   2-3 week(s)  The format for your next appointment:   In Person  Provider:   HSinclair Grooms MD  Other Instructions   [facility] OFFICE [place], [place] [address] Dept: [phone] Loc: [phone]  Sherrlyn Hock, MD  06/05/2021  You are scheduled for a Cardiac Catheterization on Tuesday, January 24 with Dr. [name].  1. Please arrive at the [facility] (Main Entrance A) at [facility]: [address] [place], [place] [address] at 5:30 AM (This time is two hours before your procedure to ensure your preparation). Free valet parking service is available.   Special note: Every effort is made to have your procedure done on time. Please understand that emergencies sometimes delay scheduled procedures.  2. Diet: Do not eat solid foods after midnight.  The patient may have clear liquids until 5am upon the day  of the procedure.  3. Labs: You will have labs drawn today.  4. Medication instructions in preparation for your procedure:   Contrast Allergy: No  You will need to hold your Telmisartan/HCTZ the day before and the morning of your procedure.  You will need to hold your Victoza the morning of your procedure.  Do not take your Janumet the morning of your procedure and continue to hold for 48 hours after.   On the morning of your procedure, take your Aspirin and any morning medicines NOT listed above.  You may use sips of water.  5. Plan for one night stay--bring personal belongings. 6. Bring a current list of your medications and current insurance cards. 7. You MUST have a responsible person to drive you home. 8. Someone MUST be with you the first 24 hours after you arrive home or your discharge will be delayed. 9. Please wear clothes that are easy to get on and off and wear slip-on shoes.  Thank you for allowing [place] to care for you!   -- [facility]     Signed, Sinclair Grooms, MD  06/05/2021 8:57 AM    [place]

## 2021-06-05 ENCOUNTER — Encounter: Payer: Self-pay | Admitting: Interventional Cardiology

## 2021-06-05 ENCOUNTER — Ambulatory Visit (INDEPENDENT_AMBULATORY_CARE_PROVIDER_SITE_OTHER): Payer: 59 | Admitting: Interventional Cardiology

## 2021-06-05 ENCOUNTER — Other Ambulatory Visit: Payer: Self-pay

## 2021-06-05 ENCOUNTER — Other Ambulatory Visit (HOSPITAL_COMMUNITY): Payer: Self-pay

## 2021-06-05 VITALS — BP 128/58 | HR 68 | Ht 65.0 in | Wt 179.6 lb

## 2021-06-05 DIAGNOSIS — G4733 Obstructive sleep apnea (adult) (pediatric): Secondary | ICD-10-CM

## 2021-06-05 DIAGNOSIS — I25118 Atherosclerotic heart disease of native coronary artery with other forms of angina pectoris: Secondary | ICD-10-CM

## 2021-06-05 DIAGNOSIS — R002 Palpitations: Secondary | ICD-10-CM | POA: Diagnosis not present

## 2021-06-05 DIAGNOSIS — I10 Essential (primary) hypertension: Secondary | ICD-10-CM | POA: Diagnosis not present

## 2021-06-05 DIAGNOSIS — E1122 Type 2 diabetes mellitus with diabetic chronic kidney disease: Secondary | ICD-10-CM | POA: Diagnosis not present

## 2021-06-05 DIAGNOSIS — R079 Chest pain, unspecified: Secondary | ICD-10-CM

## 2021-06-05 DIAGNOSIS — E785 Hyperlipidemia, unspecified: Secondary | ICD-10-CM

## 2021-06-05 DIAGNOSIS — N1831 Chronic kidney disease, stage 3a: Secondary | ICD-10-CM

## 2021-06-05 LAB — BASIC METABOLIC PANEL
BUN/Creatinine Ratio: 20 (ref 10–24)
BUN: 37 mg/dL — ABNORMAL HIGH (ref 8–27)
CO2: 23 mmol/L (ref 20–29)
Calcium: 9.7 mg/dL (ref 8.6–10.2)
Chloride: 102 mmol/L (ref 96–106)
Creatinine, Ser: 1.81 mg/dL — ABNORMAL HIGH (ref 0.76–1.27)
Glucose: 98 mg/dL (ref 70–99)
Potassium: 4.5 mmol/L (ref 3.5–5.2)
Sodium: 139 mmol/L (ref 134–144)
eGFR: 38 mL/min/{1.73_m2} — ABNORMAL LOW (ref >59)

## 2021-06-05 LAB — CBC
Hematocrit: 46.5 % (ref 37.5–51.0)
Hemoglobin: 16.1 g/dL (ref 13.0–17.7)
MCH: 31.3 pg (ref 26.6–33.0)
MCHC: 34.6 g/dL (ref 31.5–35.7)
MCV: 90 fL (ref 79–97)
Platelets: 241 10*3/uL (ref 150–450)
RBC: 5.15 x10E6/uL (ref 4.14–5.80)
RDW: 13.6 % (ref 11.6–15.4)
WBC: 8.2 10*3/uL (ref 3.4–10.8)

## 2021-06-05 MED ORDER — NITROGLYCERIN 0.4 MG SL SUBL: 0.4000 mg | SUBLINGUAL_TABLET | SUBLINGUAL | 3 refills | Status: DC | PRN

## 2021-06-05 MED ORDER — METOPROLOL SUCCINATE ER 25 MG PO TB24: 12.5000 mg | ORAL_TABLET | Freq: Every day | ORAL | 3 refills | Status: DC

## 2021-06-05 MED ORDER — CARESTART COVID-19 HOME TEST VI KIT
PACK | 0 refills | Status: DC
  Filled 2021-06-05: qty 4, 4d supply, fill #0

## 2021-06-05 NOTE — Patient Instructions (Signed)
Medication Instructions:  1) START Metoprolol Succinate 12.5mg  once daily 2)  A prescription has been sent in for Nitroglycerin.  If you have chest pain that doesn't relieve quickly, place one tablet under your tongue and allow it to dissolve.  If no relief after 5 minutes, you may take another pill.  If no relief after 5 minutes, you may take a 3rd dose but you need to call 911 and report to ER immediately.  *If you need a refill on your cardiac medications before your next appointment, please call your pharmacy*   Lab Work: BMET and CBC today  If you have labs (blood work) drawn today and your tests are completely normal, you will receive your results only by: [place] (if you have MyChart) OR A paper copy in the mail If you have any lab test that is abnormal or we need to change your treatment, we will call you to review the results.   Testing/Procedures: Your physician has requested that you have a cardiac catheterization. Cardiac catheterization is used to diagnose and/or treat various heart conditions. Doctors may recommend this procedure for a number of different reasons. The most common reason is to evaluate chest pain. Chest pain can be a symptom of coronary artery disease (CAD), and cardiac catheterization can show whether plaque is narrowing or blocking your hearts arteries. This procedure is also used to evaluate the valves, as well as measure the blood flow and oxygen levels in different parts of your heart. For further information please visit [email or web address]. Please follow instruction sheet, as given.   Follow-Up: At [facility], you and your health needs are our priority.  As part of our continuing mission to provide you with exceptional heart care, we have created designated Provider Care Teams.  These Care Teams include your primary Cardiologist (physician) and Advanced Practice Providers (APPs -  Physician Assistants and Nurse Practitioners) who all work  together to provide you with the care you need, when you need it.  We recommend signing up for the patient portal called "MyChart".  Sign up information is provided on this After Visit Summary.  MyChart is used to connect with patients for Virtual Visits (Telemedicine).  Patients are able to view lab/test results, encounter notes, upcoming appointments, etc.  Non-urgent messages can be sent to your provider as well.   To learn more about what you can do with MyChart, go to [email or web address].    Your next appointment:   2-3 week(s)  The format for your next appointment:   In Person  Provider:   Sinclair Grooms, MD     Other Instructions   [place], [place] [address] Dept: [phone] Loc: [phone]  Sherrlyn Hock, MD  06/05/2021  You are scheduled for a Cardiac Catheterization on Tuesday, January 24 with Dr. [name].  1. Please arrive at the [facility] (Main Entrance A) at [facility]: [address] [place], [place] [address] at 5:30 AM (This time is two hours before your procedure to ensure your preparation). Free valet parking service is available.   Special note: Every effort is made to have your procedure done on time. Please understand that emergencies sometimes delay scheduled procedures.  2. Diet: Do not eat solid foods after midnight.  The patient may have clear liquids until 5am upon the day of the procedure.  3. Labs: You will have labs drawn today.  4. Medication instructions in preparation for your procedure:   Contrast Allergy: No  You will need to hold your Telmisartan/HCTZ the day before and the morning of your procedure.  You will need to hold your Victoza the morning of your procedure.  Do not take your Janumet the morning of your procedure and continue to hold for 48 hours after.   On the morning of your  procedure, take your Aspirin and any morning medicines NOT listed above.  You may use sips of water.  5. Plan for one night stay--bring personal belongings. 6. Bring a current list of your medications and current insurance cards. 7. You MUST have a responsible person to drive you home. 8. Someone MUST be with you the first 24 hours after you arrive home or your discharge will be delayed. 9. Please wear clothes that are easy to get on and off and wear slip-on shoes.  Thank you for allowing [place] to care for you!   -- [facility]

## 2021-06-08 ENCOUNTER — Telehealth: Payer: Self-pay | Admitting: *Deleted

## 2021-06-08 NOTE — Telephone Encounter (Addendum)
Cardiac catheterization scheduled at [facility] for: Tuesday June 09, 2021 10:30 AM [facility] Main Entrance [facility]) at: 5:30 AM-pre-procedure hydration   Diet-no solid food after midnight prior to cath, clear liquids until 5 AM day of procedure.  Medication instructions for procedure: -Hold  Micardis-HCT-day before, day of, and 48 hours post procedure-Per Dr Tamala Julian -see 06/05/21 BMP results  Janumet-day of procedure and 48 hours post procedure  Victoza-AM of procedure -Except hold medications usual morning medications can be taken pre-cath with sips of water including aspirin 81 mg.    Confirmed patient has responsible adult to drive home post procedure and be with patient first 24 hours after arriving home.  [facility] does allow one visitor to accompany you and wait in the hospital waiting room while you are there for your procedure. You and your visitor will be asked to wear a mask once you enter the hospital.   Patient reports does not currently have any new symptoms concerning for COVID-19 and no household members with COVID-19 like illness.        Reviewed procedure/mask/visitor instructions with patient.                        See 06/05/21 BMP-discussed results and Dr Thompson Caul recommendations with patient.

## 2021-06-09 ENCOUNTER — Other Ambulatory Visit: Payer: Self-pay

## 2021-06-09 ENCOUNTER — Encounter (HOSPITAL_COMMUNITY)
Admission: RE | Disposition: A | Discharge status: Home / Self Care | Payer: Self-pay | Source: Home / Self Care | Attending: Cardiovascular Disease

## 2021-06-09 ENCOUNTER — Ambulatory Visit (HOSPITAL_COMMUNITY)
Admission: RE | Admit: 2021-06-09 | Discharge: 2021-06-09 | Disposition: A | Discharge status: Home / Self Care | Payer: 59 | Attending: Cardiovascular Disease | Admitting: Cardiovascular Disease

## 2021-06-09 DIAGNOSIS — Z8249 Family history of ischemic heart disease and other diseases of the circulatory system: Secondary | ICD-10-CM | POA: Diagnosis not present

## 2021-06-09 DIAGNOSIS — E785 Hyperlipidemia, unspecified: Secondary | ICD-10-CM | POA: Insufficient documentation

## 2021-06-09 DIAGNOSIS — Z87891 Personal history of nicotine dependence: Secondary | ICD-10-CM | POA: Diagnosis not present

## 2021-06-09 DIAGNOSIS — N1831 Chronic kidney disease, stage 3a: Secondary | ICD-10-CM | POA: Diagnosis not present

## 2021-06-09 DIAGNOSIS — Z8546 Personal history of malignant neoplasm of prostate: Secondary | ICD-10-CM | POA: Insufficient documentation

## 2021-06-09 DIAGNOSIS — I2510 Atherosclerotic heart disease of native coronary artery without angina pectoris: Secondary | ICD-10-CM

## 2021-06-09 DIAGNOSIS — I25118 Atherosclerotic heart disease of native coronary artery with other forms of angina pectoris: Secondary | ICD-10-CM | POA: Insufficient documentation

## 2021-06-09 DIAGNOSIS — E1122 Type 2 diabetes mellitus with diabetic chronic kidney disease: Secondary | ICD-10-CM | POA: Insufficient documentation

## 2021-06-09 DIAGNOSIS — G4733 Obstructive sleep apnea (adult) (pediatric): Secondary | ICD-10-CM | POA: Insufficient documentation

## 2021-06-09 DIAGNOSIS — R079 Chest pain, unspecified: Secondary | ICD-10-CM

## 2021-06-09 DIAGNOSIS — R002 Palpitations: Secondary | ICD-10-CM | POA: Diagnosis not present

## 2021-06-09 DIAGNOSIS — Z7984 Long term (current) use of oral hypoglycemic drugs: Secondary | ICD-10-CM | POA: Diagnosis not present

## 2021-06-09 DIAGNOSIS — I129 Hypertensive chronic kidney disease with stage 1 through stage 4 chronic kidney disease, or unspecified chronic kidney disease: Secondary | ICD-10-CM | POA: Diagnosis not present

## 2021-06-09 DIAGNOSIS — R931 Abnormal findings on diagnostic imaging of heart and coronary circulation: Secondary | ICD-10-CM

## 2021-06-09 DIAGNOSIS — Z7985 Long-term (current) use of injectable non-insulin antidiabetic drugs: Secondary | ICD-10-CM | POA: Diagnosis not present

## 2021-06-09 HISTORY — PX: LEFT HEART CATH AND CORONARY ANGIOGRAPHY: CATH118249

## 2021-06-09 LAB — GLUCOSE, CAPILLARY
Glucose-Capillary: 101 mg/dL — ABNORMAL HIGH (ref 70–99)
Glucose-Capillary: 104 mg/dL — ABNORMAL HIGH (ref 70–99)

## 2021-06-09 SURGERY — LEFT HEART CATH AND CORONARY ANGIOGRAPHY
Anesthesia: LOCAL

## 2021-06-09 MED ORDER — ASPIRIN 81 MG PO CHEW: 81.0000 mg | CHEWABLE_TABLET | ORAL | Status: DC

## 2021-06-09 MED ORDER — SODIUM CHLORIDE 0.9 % WEIGHT BASED INFUSION
3.0000 mL/kg/h | INTRAVENOUS | Status: AC
  Administered 2021-06-09: 06:00:00 3 mL/kg/h via INTRAVENOUS

## 2021-06-09 MED ORDER — SODIUM CHLORIDE 0.9 % WEIGHT BASED INFUSION
1.0000 mL/kg/h | INTRAVENOUS | Status: DC
  Administered 2021-06-09: 07:00:00 1 mL/kg/h via INTRAVENOUS

## 2021-06-09 MED ORDER — IOHEXOL 350 MG/ML SOLN
INTRAVENOUS | Status: DC | PRN
  Administered 2021-06-09: 14:00:00 35 mL via INTRA_ARTERIAL

## 2021-06-09 MED ORDER — SODIUM CHLORIDE 0.9 % IV SOLN: 250.0000 mL | INTRAVENOUS | Status: DC | PRN

## 2021-06-09 MED ORDER — VERAPAMIL HCL 2.5 MG/ML IV SOLN
INTRAVENOUS | Status: AC
  Filled 2021-06-09: qty 2

## 2021-06-09 MED ORDER — ACETAMINOPHEN 325 MG PO TABS: 650.0000 mg | ORAL_TABLET | ORAL | Status: DC | PRN

## 2021-06-09 MED ORDER — FENTANYL CITRATE (PF) 100 MCG/2ML IJ SOLN
INTRAMUSCULAR | Status: DC | PRN
  Administered 2021-06-09: 25 ug via INTRAVENOUS

## 2021-06-09 MED ORDER — LIDOCAINE HCL (PF) 1 % IJ SOLN
INTRAMUSCULAR | Status: AC
  Filled 2021-06-09: qty 30

## 2021-06-09 MED ORDER — VERAPAMIL HCL 2.5 MG/ML IV SOLN
INTRAVENOUS | Status: DC | PRN
  Administered 2021-06-09: 13:00:00 10 mL via INTRA_ARTERIAL

## 2021-06-09 MED ORDER — MIDAZOLAM HCL 2 MG/2ML IJ SOLN
INTRAMUSCULAR | Status: AC
  Filled 2021-06-09: qty 2

## 2021-06-09 MED ORDER — ATORVASTATIN CALCIUM 80 MG PO TABS: 80.0000 mg | ORAL_TABLET | Freq: Every day | ORAL | Status: DC

## 2021-06-09 MED ORDER — METOPROLOL SUCCINATE ER 25 MG PO TB24: 25.0000 mg | ORAL_TABLET | Freq: Every day | ORAL | 3 refills | Status: DC

## 2021-06-09 MED ORDER — LIDOCAINE HCL (PF) 1 % IJ SOLN
INTRAMUSCULAR | Status: DC | PRN
  Administered 2021-06-09: 2 mL

## 2021-06-09 MED ORDER — ASPIRIN 81 MG PO CHEW: 81.0000 mg | CHEWABLE_TABLET | Freq: Every day | ORAL | Status: DC

## 2021-06-09 MED ORDER — SODIUM CHLORIDE 0.9% FLUSH: 3.0000 mL | Freq: Two times a day (BID) | INTRAVENOUS | Status: DC

## 2021-06-09 MED ORDER — SODIUM CHLORIDE 0.9 % IV SOLN: INTRAVENOUS | Status: DC

## 2021-06-09 MED ORDER — ONDANSETRON HCL 4 MG/2ML IJ SOLN: 4.0000 mg | Freq: Four times a day (QID) | INTRAMUSCULAR | Status: DC | PRN

## 2021-06-09 MED ORDER — CLOPIDOGREL BISULFATE 75 MG PO TABS
75.0000 mg | ORAL_TABLET | Freq: Every day | ORAL | 11 refills | Status: DC
Start: 2021-06-09 — End: 2021-06-12

## 2021-06-09 MED ORDER — FENTANYL CITRATE (PF) 100 MCG/2ML IJ SOLN
INTRAMUSCULAR | Status: AC
  Filled 2021-06-09: qty 2

## 2021-06-09 MED ORDER — HEPARIN SODIUM (PORCINE) 1000 UNIT/ML IJ SOLN
INTRAMUSCULAR | Status: AC
  Filled 2021-06-09: qty 10

## 2021-06-09 MED ORDER — LABETALOL HCL 5 MG/ML IV SOLN: 10.0000 mg | INTRAVENOUS | Status: DC | PRN

## 2021-06-09 MED ORDER — SODIUM CHLORIDE 0.9% FLUSH: 3.0000 mL | INTRAVENOUS | Status: DC | PRN

## 2021-06-09 MED ORDER — HEPARIN SODIUM (PORCINE) 1000 UNIT/ML IJ SOLN
INTRAMUSCULAR | Status: DC | PRN
  Administered 2021-06-09: 4000 [IU] via INTRAVENOUS

## 2021-06-09 MED ORDER — HYDRALAZINE HCL 20 MG/ML IJ SOLN: 10.0000 mg | INTRAMUSCULAR | Status: DC | PRN

## 2021-06-09 MED ORDER — MIDAZOLAM HCL 2 MG/2ML IJ SOLN
INTRAMUSCULAR | Status: DC | PRN
  Administered 2021-06-09: 1 mg via INTRAVENOUS

## 2021-06-09 MED ORDER — HEPARIN (PORCINE) IN NACL 1000-0.9 UT/500ML-% IV SOLN
INTRAVENOUS | Status: DC | PRN
  Administered 2021-06-09 (×2): 500 mL

## 2021-06-09 MED ORDER — HEPARIN (PORCINE) IN NACL 1000-0.9 UT/500ML-% IV SOLN
INTRAVENOUS | Status: AC
  Filled 2021-06-09: qty 1000

## 2021-06-09 SURGICAL SUPPLY — 10 items
CATH OPTITORQUE TIG 4.0 5F (CATHETERS) ×2 IMPLANT
DEVICE RAD COMP TR BAND LRG (VASCULAR PRODUCTS) ×2 IMPLANT
GLIDESHEATH SLEND SS 6F .021 (SHEATH) ×2 IMPLANT
GUIDEWIRE INQWIRE 1.5J.035X260 (WIRE) IMPLANT
INQWIRE 1.5J .035X260CM (WIRE) ×3
KIT HEART LEFT (KITS) ×3 IMPLANT
PACK CARDIAC CATHETERIZATION (CUSTOM PROCEDURE TRAY) ×3 IMPLANT
SHEATH PROBE COVER 6X72 (BAG) ×2 IMPLANT
TRANSDUCER W/STOPCOCK (MISCELLANEOUS) ×3 IMPLANT
TUBING CIL FLEX 10 FLL-RA (TUBING) ×3 IMPLANT

## 2021-06-09 NOTE — Discharge Instructions (Signed)
NO JANUMET FOR 2 DAYS

## 2021-06-09 NOTE — Interval H&P Note (Signed)
Cath Lab Visit (complete for each Cath Lab visit)  Clinical Evaluation Leading to the Procedure:   ACS: No.  Non-ACS:    Anginal Classification: CCS II  Anti-ischemic medical therapy: No Therapy  Non-Invasive Test Results: No non-invasive testing performed  Prior CABG: No previous CABG      History and Physical Interval Note:  06/09/2021 1:00 PM  Peter Hock, MD  has presented today for surgery, with the diagnosis of Chest pain, CAD.  The various methods of treatment have been discussed with the patient and family. After consideration of risks, benefits and other options for treatment, the patient has consented to  Procedure(s): LEFT HEART CATH AND CORONARY ANGIOGRAPHY (N/A) as a surgical intervention.  The patient's history has been reviewed, patient examined, no change in status, stable for surgery.  I have reviewed the patient's chart and labs.  Questions were answered to the patient's satisfaction.     [name]

## 2021-06-10 ENCOUNTER — Encounter (HOSPITAL_COMMUNITY): Payer: Self-pay | Admitting: Cardiovascular Disease

## 2021-06-11 ENCOUNTER — Other Ambulatory Visit: Payer: Self-pay

## 2021-06-11 ENCOUNTER — Telehealth: Payer: Self-pay | Admitting: Interventional Cardiology

## 2021-06-11 MED ORDER — TADALAFIL 20 MG PO TABS: 20.0000 mg | ORAL_TABLET | Freq: Every day | ORAL | 3 refills | Status: DC | PRN

## 2021-06-11 MED ORDER — NITROGLYCERIN 0.4 MG SL SUBL: 0.4000 mg | SUBLINGUAL_TABLET | SUBLINGUAL | 3 refills | Status: DC | PRN

## 2021-06-11 NOTE — Telephone Encounter (Signed)
°*  STAT* If patient is at the pharmacy, call can be transferred to refill team.   1. Which medications need to be refilled? (please list name of each medication and dose if known) new prescription for Clopidogrel  2. Which pharmacy/location (including street and city if local pharmacy) is medication to be sent to? [facility] [place], [place]-  3. Do they need a 30 day or 90 day supply? 90 days

## 2021-06-12 ENCOUNTER — Other Ambulatory Visit: Payer: Self-pay | Admitting: *Deleted

## 2021-06-12 MED ORDER — CLOPIDOGREL BISULFATE 75 MG PO TABS: 75.0000 mg | ORAL_TABLET | Freq: Every day | ORAL | 11 refills | Status: DC

## 2021-06-12 NOTE — Telephone Encounter (Signed)
Follow Up:     Patient is calling back, he say he  needs to have this Clopidogrel asap. He was supposed to start on this immediately    *STAT* If patient is at the pharmacy, call can be transferred to refill team.   1. Which medications need to be refilled? (please list name of each medication and dose if known) new prescription for Clopidogrel  2. Which pharmacy/location (including street and city if local pharmacy) is medication to be sent to? [facility] [place],  3. Do they need a 30 day or 90 day supply? 90 days- patient need this today pleaset

## 2021-06-23 DIAGNOSIS — N183 Chronic kidney disease, stage 3 unspecified: Secondary | ICD-10-CM | POA: Diagnosis not present

## 2021-06-23 DIAGNOSIS — E063 Autoimmune thyroiditis: Secondary | ICD-10-CM | POA: Diagnosis not present

## 2021-06-23 DIAGNOSIS — E038 Other specified hypothyroidism: Secondary | ICD-10-CM | POA: Diagnosis not present

## 2021-06-23 DIAGNOSIS — N2581 Secondary hyperparathyroidism of renal origin: Secondary | ICD-10-CM | POA: Diagnosis not present

## 2021-06-23 DIAGNOSIS — E291 Testicular hypofunction: Secondary | ICD-10-CM | POA: Diagnosis not present

## 2021-06-23 DIAGNOSIS — I2510 Atherosclerotic heart disease of native coronary artery without angina pectoris: Secondary | ICD-10-CM | POA: Diagnosis not present

## 2021-06-23 DIAGNOSIS — E7800 Pure hypercholesterolemia, unspecified: Secondary | ICD-10-CM | POA: Diagnosis not present

## 2021-06-23 DIAGNOSIS — C61 Malignant neoplasm of prostate: Secondary | ICD-10-CM | POA: Diagnosis not present

## 2021-06-23 DIAGNOSIS — E1129 Type 2 diabetes mellitus with other diabetic kidney complication: Secondary | ICD-10-CM | POA: Diagnosis not present

## 2021-06-23 DIAGNOSIS — N411 Chronic prostatitis: Secondary | ICD-10-CM | POA: Diagnosis not present

## 2021-06-23 DIAGNOSIS — Z6827 Body mass index (BMI) 27.0-27.9, adult: Secondary | ICD-10-CM | POA: Diagnosis not present

## 2021-06-25 ENCOUNTER — Other Ambulatory Visit (HOSPITAL_COMMUNITY): Payer: Self-pay

## 2021-06-25 MED ORDER — CARESTART COVID-19 HOME TEST VI KIT
PACK | 0 refills | Status: DC
  Filled 2021-06-25: qty 4, 4d supply, fill #0

## 2021-06-28 NOTE — Progress Notes (Signed)
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   He was in the ER for prolonged chest pain 05/24/2021.  He stated his heart rate spontaneously was greater than 120 bpm.  Troponin 1 negative x2.  Set up for outpatient coronary CTA 06/02/2021.  The CTA was reassuring however he continued to have intermittent episodes of left subclavicular and parasternal discomfort even when he was seen in the office 06/05/2021.  He did not have any previous history of coronary disease.  He did have a history of pericarditis in the past.  He was referred for cardiac catheterization.  He was also started on metoprolol succinate for palpitations.  On cardiac catheterization he was found to have mild to moderate CAD and medical management was recommended.  Today, he states that he occasionally has some chest wall pain.  Nothing like he experienced before his catheterization which was more of a deep chest pain.  We discussed his most recent lipid panel which was done back in November 2022.  His LDL and triglycerides were both not at goal.  He states that he was off his Zetia for a while and then recently restarted.  We will get another lipid panel today.  His heart rate is also 49 bpm today.  He admits to taking 2 of his metoprolol tabs which would be a total of 100 mg.  Usually his heart rate is 60s to 70s.  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  For this reason he is not able to participate in cardiac rehab.  I advised him to  continue all cardiovascular activity including biking, elliptical, and walking.  He can start to do light weight lifting with higher reps.  He is advised against strenuous heavy lifting at this time.  Reports no shortness of breath nor dyspnea on exertion. Reports no chest pain, pressure, or tightness. No edema, orthopnea, PND. Reports no palpitations.    Past Medical History    Past Medical History:  Diagnosis Date   Allergy    Arthritis    Diabetes mellitus (HCC)    GERD (gastroesophageal reflux disease)    Gout    HOH (hard of hearing)    Hx of skin cancer, basal cell    Hyperlipemia    Hypertension    Hypothyroidism    Obstructive sleep apnea 11/07/2015   Pneumonia    Prostate cancer ([place])    Renal disorder    mild renal insufficiency   Rupture quadriceps tendon 09-21-13   left   Seizures ([place]) 2000   only one time-never dx why-no meds   Skin cancer    Sleep apnea    Thyroid disease    hypothyroidism   Wears glasses    Past Surgical History:  Procedure Laterality Date   COLONOSCOPY     FACIAL FRACTURE SURGERY  1965   KNEE ARTHROSCOPY Left 09/26/2013   Procedure: LEFT KNEE ARTHROSCOPY WITH DEBRIDEMENT/SHAVING (CHONDROPLASTY), SUTURE REPAIR QUADRICEPS/HAMSTRING MUSCLE RUPTURE PRIMARY;  Surgeon: Herbie Baltimore  Venetia Maxon, MD;  Location: [place];  Service: Orthopedics;  Laterality: Left;   KNEE SURGERY     left   LEFT HEART CATH AND CORONARY ANGIOGRAPHY N/A 06/09/2021   Procedure: LEFT HEART CATH AND CORONARY ANGIOGRAPHY;  Surgeon: Troy Sine, MD;  Location: [place] [facility];  Service: Cardiovascular;  Laterality: N/A;   LYMPHADENECTOMY Bilateral 05/05/2020   Procedure: LYMPHADENECTOMY, PELVIC;  Surgeon: Raynelle Bring, MD;  Location: [facility];  Service: Urology;  Laterality: Bilateral;   PROSTATE BIOPSY     QUADRICEPS TENDON REPAIR Left 09/26/2013   Procedure: REPAIR QUADRICEP TENDON;  Surgeon: Lorn Junes, MD;  Location: [place];  Service:  Orthopedics;  Laterality: Left;   ROBOT ASSISTED LAPAROSCOPIC RADICAL PROSTATECTOMY N/A 05/05/2020   Procedure: XI ROBOTIC ASSISTED LAPAROSCOPIC RADICAL PROSTATECTOMY LEVEL 2;  Surgeon: Raynelle Bring, MD;  Location: [facility];  Service: Urology;  Laterality: N/A;   TONSILLECTOMY      Allergies  Allergies  Allergen Reactions   Amlodipine    Atenolol    Celecoxib    Lisinopril Hypertension   Nsaids Other (See Comments)    Kidney damage   Ranitidine Hcl Itching    IV Zantac     EKGs/Labs/Other Studies Reviewed:   The following studies were reviewed today:  Cardiac catheterization 06/09/2021    Prox Cx to Mid Cx lesion is 10% stenosed.   Mid Cx to Dist Cx lesion is 20% stenosed.   1st Diag lesion is 70% stenosed.   Mid LAD lesion is 70% stenosed.   Mild to moderate CAD with very small first diagonal vessel having 70% ostial stenosis, and 65 to 70% LAD stenosis after the second diagonal vessel; mild nonobstructive plaque in the dominant left circumflex vessel and small nondominant RCA.   There is a 10 to 15 mm to peak aortic gradient on LV to AO pullback which was variable due to the presence of occasional PACs.   LVEDP 13 mmHg.   RECOMMENDATION: The catheterization findings were discussed with Dr. Tamala Julian.  Plan for initial increased medical therapy trial.   Aggressive lipid-lowering therapy with target LDL less than 55.  Hydrate post catheterization with CKD stage IIIb.   CARDIAC MONITOR 04/28/2022: Study Highlights     NSR Tachycardia Bradycardia syndrome No atrial fibrillation No excessive pauses No ventricular tachycardia Frequent PAC's Frequent non-sustained ectopic atrial tachycard that correlates with symptoms/events     Patch Wear Time:  14 days and 0 hours (2022-11-20T12:57:18-0500 to 2022-12-04T12:57:22-0500)   Patient had a min HR of 40 bpm, max HR of 171 bpm, and avg HR of 68 bpm. Predominant underlying rhythm was Sinus Rhythm. 944 Supraventricular  Tachycardia runs occurred, the run with the fastest interval lasting 10 beats with a max rate of 171 bpm, the  longest lasting 35.4 secs with an avg rate of 129 bpm. Supraventricular Tachycardia was detected within +/- 45 seconds of symptomatic patient event(s). Isolated SVEs were frequent (10.8%, [id number]), SVE Couplets were occasional (1.3%, 8796), and SVE  Triplets were rare (<1.0%, 3706). Isolated VEs were rare (<1.0%), VE Couplets were rare (<1.0%), and no VE Triplets were present.   EKG:  EKG is  ordered today.  The ekg ordered today demonstrates sinus bradycardia, rate 49 bpm.  Occasional PAC.   Recent Labs: 04/15/2021: ALT 22; TSH 2.33 06/05/2021: BUN 37; Creatinine, Ser 1.81; Hemoglobin 16.1; Platelets 241; Potassium 4.5; Sodium 139  Recent Lipid Panel    Component Value Date/Time   CHOL 256 (  H) 04/15/2021 0820   TRIG 220 (H) 04/15/2021 0820   HDL 56 04/15/2021 0820   CHOLHDL 4.6 04/15/2021 0820   LDLCALC 161 (H) 04/15/2021 0820     Home Medications   Current Meds  Medication Sig   Accu-Chek FastClix Lancets MISC Apply topically.   acetaminophen (TYLENOL) 500 MG tablet Take 1,000 mg by mouth at bedtime.    albuterol (VENTOLIN HFA) 108 (90 Base) MCG/ACT inhaler Inhale 2 puffs into the lungs every 6 (six) hours as needed for wheezing or shortness of breath.    allopurinol (ZYLOPRIM) 300 MG tablet Take 1 tablet (300 mg total) by mouth daily.   aspirin EC 81 MG tablet Take 81 mg by mouth daily. One tablet by mouth daily   atorvastatin (LIPITOR) 80 MG tablet Take 1 tablet (80 mg total) by mouth daily.   atorvastatin (LIPITOR) 80 MG tablet Take 1 tablet (80 mg total) by mouth daily.   azelastine (ASTELIN) 0.1 % nasal spray 1-2 puffs each nostril once or twice daily as needed   bacitracin ointment Apply 1 application topically 2 (two) times daily.   benzonatate (TESSALON) 100 MG capsule Take 1 capsule (100 mg total) by mouth every 8 (eight) hours.   Blood Glucose Monitoring Suppl  (FIFTY50 GLUCOSE METER 2.0) w/Device KIT See admin instructions.   calcium carbonate (TUMS EX) 750 MG chewable tablet Chew 2 tablets by mouth at bedtime.    Cholecalciferol 1.25 MG (50000 UT) TABS Take 50,000 Units by mouth once a week.   clopidogrel (PLAVIX) 75 MG tablet Take 1 tablet (75 mg total) by mouth daily.   COVID-19 At Home Antigen Test [facility] COVID-19 HOME TEST) KIT Use as directed   ezetimibe (ZETIA) 10 MG tablet Take 1 tablet (10 mg total) by mouth daily.   FORTESTA 10 MG/ACT (2%) GEL Apply 2 application topically daily.   FREESTYLE LITE test strip    hydrOXYzine (ATARAX/VISTARIL) 25 MG tablet Take 1 tablet (25 mg total) by mouth at bedtime.   ketoconazole (NIZORAL) 2 % cream Apply 1 application topically daily as needed for irritation.   metoprolol succinate (TOPROL XL) 25 MG 24 hr tablet Take 1 tablet (25 mg total) by mouth daily. (Patient taking differently: Take 50 mg by mouth daily.)   minoxidil (ROGAINE) 2 % external solution Apply 1 application topically daily.   Multiple Vitamins-Minerals (MULTIVITAMIN ADULTS) TABS Take 1 tablet by mouth daily.   nitroGLYCERIN (NITROSTAT) 0.4 MG SL tablet Place 1 tablet (0.4 mg total) under the tongue every 5 (five) minutes as needed for chest pain.   ondansetron (ZOFRAN ODT) 4 MG disintegrating tablet 88m ODT q4 hours prn nausea/vomit   RABEprazole (ACIPHEX) 20 MG tablet Take 20 mg by mouth in the morning and at bedtime.    simethicone (MYLICON) 1945MG chewable tablet Chew 375 mg by mouth at bedtime. Take 3 tablets (375 mg) at bedtime   sitaGLIPtin-metformin (JANUMET) 50-500 MG tablet Take 2 tablets by mouth daily.   SYNTHROID 137 MCG tablet Take 1 tablet (137 mcg total) by mouth daily before breakfast.   tadalafil (CIALIS) 20 MG tablet Take 1 tablet (20 mg total) by mouth daily as needed for erectile dysfunction.   telmisartan-hydrochlorothiazide (MICARDIS HCT) 40-12.5 MG tablet Take 0.5 tablets by mouth daily. Please discard remaining  portion of pill.   UNIFINE PENTIPS 32G X 4 MM MISC    VASCEPA 1 g capsule Take 2 capsules (2 g total) by mouth at bedtime.   VICTOZA 18 MG/3ML SOPN Inject  1.2 mg into the skin daily.     Review of Systems      All other systems reviewed and are otherwise negative except as noted above.  Physical Exam    VS:  BP (!) 110/58 (BP Location: Right Arm, Patient Position: Sitting)    Pulse (!) 49    Ht '5\' 5"'  (1.651 m)    Wt 177 lb (80.3 kg)    SpO2 98%    BMI 29.45 kg/m  , BMI Body mass index is 29.45 kg/m.  Wt Readings from Last 3 Encounters:  06/29/21 177 lb (80.3 kg)  06/09/21 173 lb (78.5 kg)  06/05/21 179 lb 9.6 oz (81.5 kg)     GEN: Well nourished, well developed, in no acute distress. HEENT: normal. Neck: Supple, no JVD, carotid bruits, or masses. Cardiac: sinus bradycardia,  no murmurs, rubs, or gallops. No clubbing, cyanosis, edema.  Radials/PT 2+ and equal bilaterally.  Respiratory:  Respirations regular and unlabored, clear to auscultation bilaterally. GI: Soft, nontender, nondistended. MS: No deformity or atrophy. Skin: Warm and dry, no rash. Neuro:  Strength and sensation are intact. Psych: Normal affect.  Assessment & Plan    Mild to moderate CAD -Continue GDMT: asa, statin, metoprolol, plavix, and zetia -he is not on any anti-anginal medication. Not having chest pain.   Palpitations -Recently started on metoprolol -He states that he is not particularly symptomatic at this time with his PACs -He did take an extra dose of metoprolol unintentionally this morning and this is why his heart rate is 49 today.  Chest pain -More musculoskeletal per patient report -He has not had any "deep" chest pain like he experienced when he had his cardiac cath -continue current medical therapy  Hyperlipidemia -last lipid panel was 03/2021 which showed LDL 161, HDL 56, total cholesterol 256, and triglycerides 220.  -goal LDL is < 70 with his CAD -Will order lipid panel -If LDL  and triglycerides remain above goal will refer to lipid clinic for further therapy  Hypertension -well controlled today in the clinic -Continue current medication regimen  Type 2 diabetes mellitus with stage IIIa chronic kidney disease -recent A1C 5.7 -per PCP    Disposition: Follow up 3 months with Sinclair Grooms, MD or APP.  Signed, Elgie Collard, PA-C 06/29/2021, 9:37 AM [place]

## 2021-06-29 ENCOUNTER — Other Ambulatory Visit: Payer: Self-pay

## 2021-06-29 ENCOUNTER — Encounter: Payer: Self-pay | Admitting: Physician Assistant

## 2021-06-29 ENCOUNTER — Ambulatory Visit (INDEPENDENT_AMBULATORY_CARE_PROVIDER_SITE_OTHER): Payer: 59 | Admitting: Physician Assistant

## 2021-06-29 VITALS — BP 110/58 | HR 49 | Ht 65.0 in | Wt 177.0 lb

## 2021-06-29 DIAGNOSIS — R079 Chest pain, unspecified: Secondary | ICD-10-CM | POA: Diagnosis not present

## 2021-06-29 DIAGNOSIS — N1831 Chronic kidney disease, stage 3a: Secondary | ICD-10-CM

## 2021-06-29 DIAGNOSIS — I10 Essential (primary) hypertension: Secondary | ICD-10-CM | POA: Diagnosis not present

## 2021-06-29 DIAGNOSIS — E1122 Type 2 diabetes mellitus with diabetic chronic kidney disease: Secondary | ICD-10-CM

## 2021-06-29 DIAGNOSIS — I25119 Atherosclerotic heart disease of native coronary artery with unspecified angina pectoris: Secondary | ICD-10-CM | POA: Diagnosis not present

## 2021-06-29 DIAGNOSIS — R002 Palpitations: Secondary | ICD-10-CM

## 2021-06-29 DIAGNOSIS — G4733 Obstructive sleep apnea (adult) (pediatric): Secondary | ICD-10-CM

## 2021-06-29 DIAGNOSIS — I25118 Atherosclerotic heart disease of native coronary artery with other forms of angina pectoris: Secondary | ICD-10-CM

## 2021-06-29 DIAGNOSIS — E785 Hyperlipidemia, unspecified: Secondary | ICD-10-CM

## 2021-06-29 LAB — LIPID PANEL
Chol/HDL Ratio: 2.5 ratio (ref 0.0–5.0)
Cholesterol, Total: 114 mg/dL (ref 100–199)
HDL: 45 mg/dL (ref >39)
LDL Chol Calc (NIH): 50 mg/dL (ref 0–99)
Triglycerides: 100 mg/dL (ref 0–149)
VLDL Cholesterol Cal: 19 mg/dL (ref 5–40)

## 2021-06-29 LAB — COMPREHENSIVE METABOLIC PANEL
ALT: 32 IU/L (ref 0–44)
AST: 28 IU/L (ref 0–40)
Albumin/Globulin Ratio: 2.2 (ref 1.2–2.2)
Albumin: 4.4 g/dL (ref 3.7–4.7)
Alkaline Phosphatase: 99 IU/L (ref 44–121)
BUN/Creatinine Ratio: 22 (ref 10–24)
BUN: 35 mg/dL — ABNORMAL HIGH (ref 8–27)
Bilirubin Total: 0.3 mg/dL (ref 0.0–1.2)
CO2: 23 mmol/L (ref 20–29)
Calcium: 9.5 mg/dL (ref 8.6–10.2)
Chloride: 105 mmol/L (ref 96–106)
Creatinine, Ser: 1.62 mg/dL — ABNORMAL HIGH (ref 0.76–1.27)
Globulin, Total: 2 g/dL (ref 1.5–4.5)
Glucose: 96 mg/dL (ref 70–99)
Potassium: 4 mmol/L (ref 3.5–5.2)
Sodium: 143 mmol/L (ref 134–144)
Total Protein: 6.4 g/dL (ref 6.0–8.5)
eGFR: 44 mL/min/{1.73_m2} — ABNORMAL LOW (ref >59)

## 2021-06-29 NOTE — Patient Instructions (Signed)
Medication Instructions:   Your physician recommends that you continue on your current medications as directed. Please refer to the Current Medication list given to you today.   *If you need a refill on your cardiac medications before your next appointment, please call your pharmacy*   Lab Work:  TODAY!!!!!  CMET/LIPID  If you have labs (blood work) drawn today and your tests are completely normal, you will receive your results only by: [place] (if you have MyChart) OR A paper copy in the mail If you have any lab test that is abnormal or we need to change your treatment, we will call you to review the results.   Testing/Procedures:  None ordered.   Follow-Up: At [facility], you and your health needs are our priority.  As part of our continuing mission to provide you with exceptional heart care, we have created designated Provider Care Teams.  These Care Teams include your primary Cardiologist (physician) and Advanced Practice Providers (APPs -  Physician Assistants and Nurse Practitioners) who all work together to provide you with the care you need, when you need it.  We recommend signing up for the patient portal called "MyChart".  Sign up information is provided on this After Visit Summary.  MyChart is used to connect with patients for Virtual Visits (Telemedicine).  Patients are able to view lab/test results, encounter notes, upcoming appointments, etc.  Non-urgent messages can be sent to your provider as well.   To learn more about what you can do with MyChart, go to [email or web address].    Your next appointment:   3 month(s)  The format for your next appointment:   In Person  Provider:   Sinclair Grooms, MD

## 2021-07-03 DIAGNOSIS — C61 Malignant neoplasm of prostate: Secondary | ICD-10-CM | POA: Diagnosis not present

## 2021-07-17 DIAGNOSIS — C61 Malignant neoplasm of prostate: Secondary | ICD-10-CM | POA: Diagnosis not present

## 2021-07-17 DIAGNOSIS — E291 Testicular hypofunction: Secondary | ICD-10-CM | POA: Diagnosis not present

## 2021-07-17 DIAGNOSIS — N3941 Urge incontinence: Secondary | ICD-10-CM | POA: Diagnosis not present

## 2021-07-23 ENCOUNTER — Other Ambulatory Visit: Payer: Self-pay

## 2021-07-23 ENCOUNTER — Encounter: Payer: Self-pay | Admitting: Internal Medicine

## 2021-07-23 MED ORDER — RABEPRAZOLE SODIUM 20 MG PO TBEC: 20.0000 mg | DELAYED_RELEASE_TABLET | Freq: Two times a day (BID) | ORAL | 3 refills | Status: DC

## 2021-07-23 MED ORDER — FORTESTA 10 MG/ACT (2%) TD GEL: 2.0000 "application " | Freq: Every day | TRANSDERMAL | 3 refills | Status: DC

## 2021-07-24 ENCOUNTER — Encounter: Payer: Self-pay | Admitting: Internal Medicine

## 2021-07-24 DIAGNOSIS — H349 Unspecified retinal vascular occlusion: Secondary | ICD-10-CM | POA: Diagnosis not present

## 2021-07-24 DIAGNOSIS — H5203 Hypermetropia, bilateral: Secondary | ICD-10-CM | POA: Diagnosis not present

## 2021-07-24 DIAGNOSIS — H40013 Open angle with borderline findings, low risk, bilateral: Secondary | ICD-10-CM | POA: Diagnosis not present

## 2021-07-24 LAB — HM DIABETES FOOT EXAM

## 2021-07-24 LAB — HM DIABETES EYE EXAM

## 2021-07-27 ENCOUNTER — Encounter: Payer: Self-pay | Admitting: Internal Medicine

## 2021-08-05 ENCOUNTER — Telehealth: Payer: Self-pay | Admitting: Interventional Cardiology

## 2021-08-05 ENCOUNTER — Other Ambulatory Visit: Payer: Self-pay | Admitting: *Deleted

## 2021-08-05 DIAGNOSIS — H348192 Central retinal vein occlusion, unspecified eye, stable: Secondary | ICD-10-CM

## 2021-08-05 NOTE — Telephone Encounter (Signed)
Fax received from pt's Opthalmologist, Dr. Luberta Mutter at [facility] requesting a carotid doppler be ordered for retinal vein occlusion.  Dr. Tamala Julian agreeable.  Order placed.  ?

## 2021-08-21 ENCOUNTER — Ambulatory Visit (HOSPITAL_COMMUNITY)
Admission: RE | Admit: 2021-08-21 | Discharge: 2021-08-21 | Disposition: A | Discharge status: Home / Self Care | Payer: 59 | Source: Ambulatory Visit | Attending: Cardiology | Admitting: Cardiology

## 2021-08-21 DIAGNOSIS — H348192 Central retinal vein occlusion, unspecified eye, stable: Secondary | ICD-10-CM | POA: Diagnosis not present

## 2021-08-21 DIAGNOSIS — H348112 Central retinal vein occlusion, right eye, stable: Secondary | ICD-10-CM | POA: Diagnosis not present

## 2021-09-02 DIAGNOSIS — Z20822 Contact with and (suspected) exposure to covid-19: Secondary | ICD-10-CM | POA: Diagnosis not present

## 2021-09-11 DIAGNOSIS — Z20822 Contact with and (suspected) exposure to covid-19: Secondary | ICD-10-CM | POA: Diagnosis not present

## 2021-09-14 DIAGNOSIS — Z20822 Contact with and (suspected) exposure to covid-19: Secondary | ICD-10-CM | POA: Diagnosis not present

## 2021-09-17 DIAGNOSIS — Z20822 Contact with and (suspected) exposure to covid-19: Secondary | ICD-10-CM | POA: Diagnosis not present

## 2021-09-21 ENCOUNTER — Ambulatory Visit: Payer: 59 | Admitting: Internal Medicine

## 2021-09-22 ENCOUNTER — Other Ambulatory Visit: Payer: Self-pay

## 2021-09-22 DIAGNOSIS — E782 Mixed hyperlipidemia: Secondary | ICD-10-CM

## 2021-09-25 ENCOUNTER — Encounter: Payer: Self-pay | Admitting: Internal Medicine

## 2021-09-25 ENCOUNTER — Ambulatory Visit (INDEPENDENT_AMBULATORY_CARE_PROVIDER_SITE_OTHER): Payer: 59 | Admitting: Internal Medicine

## 2021-09-25 VITALS — BP 130/72 | HR 67 | Temp 97.5°F | Ht 65.0 in | Wt 182.8 lb

## 2021-09-25 DIAGNOSIS — M7918 Myalgia, other site: Secondary | ICD-10-CM | POA: Diagnosis not present

## 2021-09-25 DIAGNOSIS — E782 Mixed hyperlipidemia: Secondary | ICD-10-CM

## 2021-09-25 DIAGNOSIS — Z8719 Personal history of other diseases of the digestive system: Secondary | ICD-10-CM | POA: Diagnosis not present

## 2021-09-25 DIAGNOSIS — E038 Other specified hypothyroidism: Secondary | ICD-10-CM

## 2021-09-25 DIAGNOSIS — E063 Autoimmune thyroiditis: Secondary | ICD-10-CM | POA: Diagnosis not present

## 2021-09-25 DIAGNOSIS — I491 Atrial premature depolarization: Secondary | ICD-10-CM

## 2021-09-25 DIAGNOSIS — E1122 Type 2 diabetes mellitus with diabetic chronic kidney disease: Secondary | ICD-10-CM | POA: Diagnosis not present

## 2021-09-25 DIAGNOSIS — I10 Essential (primary) hypertension: Secondary | ICD-10-CM | POA: Diagnosis not present

## 2021-09-25 DIAGNOSIS — I25118 Atherosclerotic heart disease of native coronary artery with other forms of angina pectoris: Secondary | ICD-10-CM

## 2021-09-25 DIAGNOSIS — E039 Hypothyroidism, unspecified: Secondary | ICD-10-CM

## 2021-09-25 DIAGNOSIS — N1831 Chronic kidney disease, stage 3a: Secondary | ICD-10-CM

## 2021-09-25 MED ORDER — BENZONATATE 100 MG PO CAPS: 100.0000 mg | ORAL_CAPSULE | Freq: Two times a day (BID) | ORAL | 1 refills | Status: DC | PRN

## 2021-09-25 NOTE — Progress Notes (Signed)
   Subjective:    Patient ID: Peter Hock, MD, male    DOB: May 20, 1945, 76 y.o.   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  He has a history of type 2 diabetes mellitus diagnosed in 2002.  He is treated with Victoza and Janumet.  History of hypertension and stage IIIa chronic kidney disease followed by Dr. Marval Regal.  History of vitamin D deficiency treated with high-dose vitamin D weekly.  History of GE reflux.  History of paroxysmal atrial tachycardia diagnosed in 1976.  History of insomnia due to PTSD and chronic pain.  Left knee arthroscopic surgery 1999.  Patient says Cox 2 inhibitors led to his chronic kidney disease.  He now takes Tylenol for pain.  History of hypothyroidism due to Hashimoto's thyroiditis.  He is on thyroid replacement medication.  History of allergic rhinitis, BPH, recurrent prostatitis.  Has mixed hyperlipidemia treated with Zetia and Vascepa.  High-frequency hearing loss diagnosed in 1999.  History of recurrent vertigo.  History of sleep apnea treated with CPAP diagnosed in 1999.  Had a grand mal seizure in December 2000 with normal MRI.  No specific cause of the seizure was diagnosed.  History of multilevel spinal stenosis.  History of skin urticaria and chronic pruritus that started in 2021 treated with steroid creams, Atarax Benadryl and famotidine.  Stop smoking in 1974 with an approximate 19-pack-year history.  In November 2022 he was seen by Dr. Pernell Dupre for palpitations.  He presented to the emergency department in January 2023 with chest pain.  MI was ruled out.  He underwent cardiac catheterization.  He had a coronary calcium score of 128.  An ostial stenosis of D1 between 70 and 99% with small caliber vessel.  Proximal circumflex to mid circumflex was 10% stenosed, mid circumflex to distal circumflex lesion 20% stenosed, first diagonal lesion was 70% stenosed and mid LAD lesion was 70% stenosed.  Aggressive medical therapy was  recommended.  Goal is to keep LDL less than 55.  He wore a Zio patch and had SVT and frequent SVE's in January 2023 as well.  He is now on Plavix 75 mg daily and metoprolol XL 25 mg daily as well as atorvastatin 80 mg daily and Micardis HCTZ 40/12.5 1/2 tablet daily    Review of Systems denies chest pain or shortness of breath     Objective:   Physical Exam Blood pressure 130/72 pulse 67 pulse oximetry 96% weight 182 pounds 12 ounces BMI 30.41 Neck is supple without JVD thyromegaly or carotid bruits.  Chest is clear to auscultation without rales or wheezing.  Cardiac exam: Regular rate and rhythm today.      Assessment & Plan:  Coronary artery disease followed by [facility] cardiology, Dr. Pernell Dupre.  Currently on Plavix.  Hyperlipidemia-lipid panel was normal in February 2023.  Hypertension treated with metoprolol and myocarditis  History of palpitations-PACs  Type 2 diabetes mellitus- stable  Hypothyroidism treated with Synthroid  Hyperlipidemia treated with Zetia and atorvastatin  Chronic kidney disease stage IIIa followed by Nephrology  Allergic rhinitis  BPH  History of sleep apnea  GE reflux  Chronic anticoagulation with Plavix  Plan: He will continue with current medications.  Lipid panel was normal in mid February.  Follow-up in 6 months.

## 2021-09-29 ENCOUNTER — Other Ambulatory Visit (HOSPITAL_COMMUNITY): Payer: Self-pay

## 2021-09-29 MED ORDER — INSULIN PEN NEEDLE 32G X 4 MM MISC
0 refills | Status: DC
  Filled 2021-09-29: qty 100, 90d supply, fill #0

## 2021-10-06 ENCOUNTER — Ambulatory Visit: Payer: 59 | Admitting: Interventional Cardiology

## 2021-10-09 ENCOUNTER — Ambulatory Visit
Admission: RE | Admit: 2021-10-09 | Discharge: 2021-10-09 | Disposition: A | Discharge status: Home / Self Care | Payer: 59 | Source: Ambulatory Visit | Attending: Urgent Care | Admitting: Urgent Care

## 2021-10-09 ENCOUNTER — Telehealth: Payer: Self-pay

## 2021-10-09 VITALS — BP 109/61 | HR 81 | Temp 100.6°F | Resp 20

## 2021-10-09 DIAGNOSIS — R509 Fever, unspecified: Secondary | ICD-10-CM

## 2021-10-09 DIAGNOSIS — U071 COVID-19: Secondary | ICD-10-CM | POA: Diagnosis not present

## 2021-10-09 DIAGNOSIS — B349 Viral infection, unspecified: Secondary | ICD-10-CM

## 2021-10-09 MED ORDER — NIRMATRELVIR&RITONAVIR 150/100 10 X 150 MG & 10 X 100MG PO TBPK: ORAL_TABLET | ORAL | 0 refills | Status: DC

## 2021-10-09 MED ORDER — PROMETHAZINE-DM 6.25-15 MG/5ML PO SYRP: 5.0000 mL | ORAL_SOLUTION | Freq: Every evening | ORAL | 0 refills | Status: DC | PRN

## 2021-10-09 MED ORDER — ACETAMINOPHEN 325 MG PO TABS
650.0000 mg | ORAL_TABLET | Freq: Once | ORAL | Status: AC
  Administered 2021-10-09: 650 mg via ORAL

## 2021-10-09 MED ORDER — BENZONATATE 100 MG PO CAPS: 100.0000 mg | ORAL_CAPSULE | Freq: Three times a day (TID) | ORAL | 0 refills | Status: DC | PRN

## 2021-10-09 NOTE — Telephone Encounter (Signed)
Patient made aware that we are unable to complete a flu test today.

## 2021-10-09 NOTE — ED Triage Notes (Signed)
Pt states he had an exposure to covid, he c/o fever (101.74F), body aches, dizziness and fatigue. Started: 10/07/21

## 2021-10-09 NOTE — ED Provider Notes (Addendum)
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  Patient had exposure to COVID-19 when he was in [place] for funeral.  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  Last creatinine level was in February of this year.  No smoking, asthma.  Has allergies.  No current facility-administered medications for this encounter.  Current Outpatient Medications:    Accu-Chek FastClix Lancets MISC, Apply topically., Disp: , Rfl:    acetaminophen (TYLENOL) 500 MG tablet, Take 1,000 mg by mouth at bedtime. , Disp: , Rfl:    albuterol (VENTOLIN HFA) 108 (90 Base) MCG/ACT inhaler, Inhale 2 puffs into the lungs every 6 (six) hours as needed for wheezing or shortness of breath. , Disp: , Rfl:    allopurinol (ZYLOPRIM) 300 MG tablet, Take 1 tablet (300 mg total) by mouth daily., Disp: 90 tablet, Rfl: 1   aspirin EC 81 MG tablet, Take 81 mg by mouth daily. One tablet by mouth daily, Disp: , Rfl:    atorvastatin (LIPITOR) 80 MG tablet, Take 1 tablet (80 mg total) by mouth daily., Disp: 90 tablet, Rfl: 3   atorvastatin (LIPITOR) 80 MG tablet, Take 1 tablet (80 mg total) by mouth daily., Disp: 90 tablet, Rfl: 3   azelastine (ASTELIN) 0.1 % nasal spray, 1-2 puffs each nostril once or twice daily as needed, Disp: 30 mL, Rfl: 12   bacitracin ointment, Apply 1 application topically 2 (two) times daily., Disp: 14 g, Rfl: 0   benzonatate (TESSALON) 100 MG capsule, Take 1 capsule (100 mg total) by mouth 2 (two) times daily as needed for cough., Disp: 90 capsule, Rfl: 1   Blood Glucose Monitoring Suppl (FIFTY50 GLUCOSE METER 2.0) w/Device KIT, See admin instructions., Disp: , Rfl:    calcium carbonate (TUMS EX) 750 MG chewable tablet, Chew 2 tablets by mouth at bedtime. , Disp: , Rfl:    Cholecalciferol 1.25 MG (50000 UT) TABS, Take 50,000 Units by mouth once a week.,  Disp: 12 tablet, Rfl: 3   clopidogrel (PLAVIX) 75 MG tablet, Take 1 tablet (75 mg total) by mouth daily., Disp: 30 tablet, Rfl: 11   COVID-19 At Home Antigen Test [facility] COVID-19 HOME TEST) KIT, Use as directed, Disp: 4 each, Rfl: 0   ezetimibe (ZETIA) 10 MG tablet, Take 1 tablet (10 mg total) by mouth daily., Disp: 90 tablet, Rfl: 3   FORTESTA 10 MG/ACT (2%) GEL, Apply 2 application. topically daily., Disp: 60 g, Rfl: 3   FREESTYLE LITE test strip, , Disp: , Rfl:    hydrOXYzine (ATARAX/VISTARIL) 25 MG tablet, Take 1 tablet (25 mg total) by mouth at bedtime., Disp: 90 tablet, Rfl: 3   Insulin Pen Needle 32G X 4 MM MISC, Use as directed daily, Disp: 100 each, Rfl: 0   ketoconazole (NIZORAL) 2 % cream, Apply 1 application topically daily as needed for irritation., Disp: 15 g, Rfl: 3   metoprolol succinate (TOPROL XL) 25 MG 24 hr tablet, Take 1 tablet (25 mg total) by mouth daily. (Patient taking differently: Take 50 mg by mouth daily.), Disp: 45 tablet, Rfl: 3   minoxidil (ROGAINE) 2 % external solution, Apply 1 application topically daily., Disp: 60 mL, Rfl: 3   Multiple Vitamins-Minerals (MULTIVITAMIN ADULTS) TABS, Take 1 tablet by mouth daily., Disp: , Rfl:    nitroGLYCERIN (NITROSTAT) 0.4 MG SL tablet, Place 1  tablet (0.4 mg total) under the tongue every 5 (five) minutes as needed for chest pain., Disp: 25 tablet, Rfl: 3   ondansetron (ZOFRAN ODT) 4 MG disintegrating tablet, 41m ODT q4 hours prn nausea/vomit, Disp: 20 tablet, Rfl: 0   RABEprazole (ACIPHEX) 20 MG tablet, Take 1 tablet (20 mg total) by mouth in the morning and at bedtime., Disp: 180 tablet, Rfl: 3   simethicone (MYLICON) 1818MG chewable tablet, Chew 375 mg by mouth at bedtime. Take 3 tablets (375 mg) at bedtime, Disp: , Rfl:    sitaGLIPtin-metformin (JANUMET) 50-500 MG tablet, Take 2 tablets by mouth daily., Disp: 180 tablet, Rfl: 3   SYNTHROID 137 MCG tablet, Take 1 tablet (137 mcg total) by mouth daily before breakfast., Disp:  90 tablet, Rfl: 3   tadalafil (CIALIS) 20 MG tablet, Take 1 tablet (20 mg total) by mouth daily as needed for erectile dysfunction., Disp: 10 tablet, Rfl: 3   telmisartan-hydrochlorothiazide (MICARDIS HCT) 40-12.5 MG tablet, Take 0.5 tablets by mouth daily. Please discard remaining portion of pill., Disp: 90 tablet, Rfl: 3   UNIFINE PENTIPS 32G X 4 MM MISC, , Disp: , Rfl:    VASCEPA 1 g capsule, Take 2 capsules (2 g total) by mouth at bedtime., Disp: 180 capsule, Rfl: 3   VICTOZA 18 MG/3ML SOPN, Inject 1.2 mg into the skin daily., Disp: 9 mL, Rfl: 3   Allergies  Allergen Reactions   Amlodipine    Atenolol    Celecoxib    Lisinopril Hypertension   Nsaids Other (See Comments)    Kidney damage   Ranitidine Hcl Itching    IV Zantac    Past Medical History:  Diagnosis Date   Allergy    Arthritis    Diabetes mellitus (HCC)    GERD (gastroesophageal reflux disease)    Gout    HOH (hard of hearing)    Hx of skin cancer, basal cell    Hyperlipemia    Hypertension    Hypothyroidism    Obstructive sleep apnea 11/07/2015   Pneumonia    Prostate cancer (HCC)    Renal disorder    mild renal insufficiency   Rupture quadriceps tendon 09-21-13   left   [place]-never dx why-no meds   Skin cancer    Sleep apnea    Thyroid disease    hypothyroidism   Wears glasses      Past Surgical History:  Procedure Laterality Date   COLONOSCOPY     FACIAL FRACTURE SURGERY  1965   KNEE ARTHROSCOPY Left 09/26/2013   Procedure: LEFT KNEE ARTHROSCOPY WITH DEBRIDEMENT/SHAVING (CHONDROPLASTY), SUTURE REPAIR QUADRICEPS/HAMSTRING MUSCLE RUPTURE PRIMARY;  Surgeon: RLorn Junes MD;  [facility]  Service: Orthopedics;  Laterality: Left;   KNEE SURGERY     left   LEFT HEART CATH AND CORONARY ANGIOGRAPHY N/A 06/09/2021   Procedure: LEFT HEART CATH AND CORONARY ANGIOGRAPHY;  Surgeon: KTroy Sine MD;  [facility];  Service:  Cardiovascular;  Laterality: N/A;   LYMPHADENECTOMY Bilateral 05/05/2020   Procedure: LYMPHADENECTOMY, PELVIC;  Surgeon: BRaynelle Bring MD;  Location: [facility];  Service: Urology;  Laterality: Bilateral;   PROSTATE BIOPSY     QUADRICEPS TENDON REPAIR Left 09/26/2013   Procedure: REPAIR QUADRICEP TENDON;  Surgeon: RLorn Junes MD;  [facility]  Service: Orthopedics;  Laterality: Left;   ROBOT ASSISTED LAPAROSCOPIC RADICAL PROSTATECTOMY N/A 05/05/2020   Procedure: XI ROBOTIC ASSISTED  LAPAROSCOPIC RADICAL PROSTATECTOMY LEVEL 2;  Surgeon: Raynelle Bring, MD;  Location: [facility];  Service: Urology;  Laterality: N/A;   TONSILLECTOMY      Family History  Problem Relation Age of Onset   Breast cancer Mother    Heart disease Mother    Heart disease Father    Breast cancer Sister    Hypertension Other    Diabetes Other    Esophageal cancer Neg Hx    Rectal cancer Neg Hx    Stomach cancer Neg Hx    Prostate cancer Neg Hx    Pancreatic cancer Neg Hx    Colon cancer Neg Hx     Social History   Tobacco Use   Smoking status: Former    Packs/day: 0.50    Years: 19.00    Pack years: 9.50    Types: Cigarettes    Quit date: 09/21/1972    Years since quitting: 49.0   Smokeless tobacco: Never  Vaping Use   Vaping Use: Never used  Substance Use Topics   Alcohol use: No    Alcohol/week: 14.0 standard drinks    Types: 7 Glasses of wine, 7 Shots of liquor per week    Comment: no longer drinks   Drug use: No    ROS   Objective:   Vitals: BP 109/61 (BP Location: Right Arm)   Pulse 81   Temp (!) 100.6 F (38.1 C) (Oral)   Resp 20   SpO2 95%   Physical Exam Constitutional:      General: He is not in acute distress.    Appearance: Normal appearance. He is well-developed. He is not ill-appearing, toxic-appearing or diaphoretic.  HENT:     Head: Normocephalic and atraumatic.     Right Ear: External ear normal.     Left Ear: External ear normal.     Nose:  Nose normal.     Mouth/Throat:     Mouth: Mucous membranes are moist.  Eyes:     General: No scleral icterus.       Right eye: No discharge.        Left eye: No discharge.     Extraocular Movements: Extraocular movements intact.  Cardiovascular:     Rate and Rhythm: Normal rate and regular rhythm.     Heart sounds: Normal heart sounds. No murmur heard.   No friction rub. No gallop.  Pulmonary:     Effort: Pulmonary effort is normal. No respiratory distress.     Breath sounds: Normal breath sounds. No stridor. No wheezing, rhonchi or rales.  Neurological:     Mental Status: He is alert and oriented to person, place, and time.  Psychiatric:        Mood and Affect: Mood normal.        Behavior: Behavior normal.        Thought Content: Thought content normal.    Assessment and Plan :   PDMP not reviewed this encounter.  1. Clinical diagnosis of COVID-19   2. Acute viral syndrome   3. Fever, unspecified    Patient had concerns about having flu and therefore ordered a test to include COVID and flu testing. Deferred imaging given clear cardiopulmonary exam, hemodynamically stable vital signs.  Patient given Tylenol in clinic for his fever.  We will have him start Paxlovid at the renal dosing as his creatinine clearance was 44 mL/min.  Recommend supportive care otherwise. I did a thorough review of medication interactions with Paxlovid and discussed them with  patient. Counseled patient on potential for adverse effects with medications prescribed/recommended today, ER and return-to-clinic precautions discussed, patient verbalized understanding.    Jaynee Eagles, PA-C 10/09/21 1504

## 2021-10-10 LAB — NOVEL CORONAVIRUS, NAA: SARS-CoV-2, NAA: DETECTED — AB

## 2021-10-14 NOTE — Patient Instructions (Addendum)
It was a pleasure as always to see you today.  We will plan to follow-up in 6 months for annual physical exam and fasting labs.

## 2021-10-19 ENCOUNTER — Other Ambulatory Visit: Payer: Self-pay | Admitting: Internal Medicine

## 2021-10-20 ENCOUNTER — Telehealth: Payer: Self-pay

## 2021-10-20 ENCOUNTER — Telehealth (INDEPENDENT_AMBULATORY_CARE_PROVIDER_SITE_OTHER): Payer: 59 | Admitting: Internal Medicine

## 2021-10-20 ENCOUNTER — Encounter: Payer: Self-pay | Admitting: Internal Medicine

## 2021-10-20 DIAGNOSIS — I2510 Atherosclerotic heart disease of native coronary artery without angina pectoris: Secondary | ICD-10-CM

## 2021-10-20 DIAGNOSIS — E063 Autoimmune thyroiditis: Secondary | ICD-10-CM | POA: Diagnosis not present

## 2021-10-20 DIAGNOSIS — U071 COVID-19: Secondary | ICD-10-CM

## 2021-10-20 DIAGNOSIS — N1831 Chronic kidney disease, stage 3a: Secondary | ICD-10-CM

## 2021-10-20 DIAGNOSIS — E038 Other specified hypothyroidism: Secondary | ICD-10-CM | POA: Diagnosis not present

## 2021-10-20 DIAGNOSIS — E782 Mixed hyperlipidemia: Secondary | ICD-10-CM | POA: Diagnosis not present

## 2021-10-20 DIAGNOSIS — E1122 Type 2 diabetes mellitus with diabetic chronic kidney disease: Secondary | ICD-10-CM

## 2021-10-20 DIAGNOSIS — I10 Essential (primary) hypertension: Secondary | ICD-10-CM

## 2021-10-20 MED ORDER — AZITHROMYCIN 250 MG PO TABS: ORAL_TABLET | ORAL | 0 refills | Status: AC

## 2021-10-20 MED ORDER — HYDROCODONE BIT-HOMATROP MBR 5-1.5 MG/5ML PO SOLN: 5.0000 mL | Freq: Three times a day (TID) | ORAL | 0 refills | Status: DC | PRN

## 2021-10-20 NOTE — Progress Notes (Signed)
   Subjective:    Patient ID: Peter Hock, MD, male    DOB: [birth year], 76 y.o.   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  He was seen at [facility] health urgent care at [facility] on May 26 and tested positive for COVID-19.  He was treated with renal dose of Paxlovid.  He had temp of 100.6 degrees with a respiratory rate of 20.  Pulse oximetry was 95% on room air.  His chest was clear at the time.  He called today saying that initially he felt better after taking Paxlovid but then this week began having symptoms again with temperature of 101 degrees, cough, and sneezing.  He is asking about another round of Paxlovid but I do not think this is indicated at this point in time.  He may have Paxlovid rebound.  He has listen to his lungs with his own stethoscope and feels that his chest is clear.  A chest x-ray was offered but he has declined at this time.  He has malaise and fatigue.  He is able to stay hydrated.  He does not have a pulse oximeter at home at the present time.  His wife also has similar symptoms.  He has a history of coronary artery disease, type 2 diabetes mellitus, mixed hyperlipidemia, history of PACs, chronic kidney disease stage IIIa.  History of sleep apnea treated with CPAP.  History of hypothyroidism, BPH, allergic rhinitis, hypertension.  He was seen today via interactive audio and video telecommunications.  Initially video was enabled and working well but subsequently video disappeared and we continued with audio only.  He is agreeable to visit in this format today.  He is at his home and I am at my office.  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    Review of Systems see above regarding his symptoms     Objective:   Physical Exam  Does not appear to be tachypneic today on video visit.      Assessment &  Plan:  Recurrent symptoms of COVID-19 with malaise and fatigue-?  Paxlovid rebound  Plan: He still has coughing and congestion.  We discussed options and we decided on Zithromax Z-PAK 2 tabs day 1 followed by 1 tab days 2 through 5.  He will take Hycodan sparingly 1 teaspoon every 8 hours as needed for cough.  He will monitor pulse oximetry.  He will rest at home and walk some to prevent atelectasis.  I think you needs to remain out of work for the remainder of the week.  We will be happy to complete FMLA form if necessary.  Time spent with patient, chart reviewe and medical decision making is 25 minutes

## 2021-10-20 NOTE — Telephone Encounter (Signed)
Patient states that he had covid over memorial day. He took a course of paxlovid and was feeling better. Starting yesterday he got sick again. Fever of 101, cough, sneezing. He is asking if he should do another round of paxlovid. Please advise.

## 2021-10-20 NOTE — Telephone Encounter (Signed)
Scheduled video visit 

## 2021-10-20 NOTE — Patient Instructions (Signed)
Take Zithromax Z-PAK 2 tabs day 1 followed by 1 tab days 2 through 5.  Take Hycodan sparingly 1 teaspoon every 8 hours as needed for cough.  Rest and drink plenty of fluids.  Monitor pulse oximetry.  Walk some to prevent atelectasis.

## 2021-10-21 ENCOUNTER — Encounter: Payer: Self-pay | Admitting: Internal Medicine

## 2021-10-21 ENCOUNTER — Telehealth: Payer: Self-pay

## 2021-10-21 NOTE — Telephone Encounter (Signed)
[facility] mail order pharmacy is requesting clarification for pt's medication nitroglycerin and tadalafil. Pharmacy states that there is a interaction statin that cialis + organic nitrates is a potenital of hypotensive effects when cialis is used with nitrates. The combination is contraindicated and an appropriate time interval between administration of cialis and nitrates has not been established. Is Dr. Tamala Julian aware and does the doctor want the pt to continue taking both medications? Please confirm    Inv: [id number]   Ph# [phone]

## 2021-10-23 ENCOUNTER — Encounter: Payer: Self-pay | Admitting: Internal Medicine

## 2021-10-26 MED ORDER — TADALAFIL 20 MG PO TABS: 20.0000 mg | ORAL_TABLET | Freq: Every day | ORAL | 3 refills | Status: DC | PRN

## 2021-10-26 NOTE — Telephone Encounter (Signed)
Instructions for Cialis updated with parameter for taking Cialis and Nitroglycerin.

## 2021-11-29 ENCOUNTER — Other Ambulatory Visit: Payer: Self-pay | Admitting: Interventional Cardiology

## 2021-11-30 NOTE — Telephone Encounter (Signed)
Pharmacy is requesting Metoprolol 25 mg daily but pts med list states that he takes 50 mg daily. Please advise

## 2021-12-03 ENCOUNTER — Encounter: Payer: Self-pay | Admitting: Internal Medicine

## 2021-12-08 ENCOUNTER — Encounter: Payer: Self-pay | Admitting: Interventional Cardiology

## 2021-12-08 ENCOUNTER — Encounter: Payer: Self-pay | Admitting: Internal Medicine

## 2021-12-08 DIAGNOSIS — Z862 Personal history of diseases of the blood and blood-forming organs and certain disorders involving the immune mechanism: Secondary | ICD-10-CM

## 2021-12-08 DIAGNOSIS — E038 Other specified hypothyroidism: Secondary | ICD-10-CM

## 2021-12-08 DIAGNOSIS — I25119 Atherosclerotic heart disease of native coronary artery with unspecified angina pectoris: Secondary | ICD-10-CM

## 2021-12-08 DIAGNOSIS — I10 Essential (primary) hypertension: Secondary | ICD-10-CM

## 2021-12-08 DIAGNOSIS — E1122 Type 2 diabetes mellitus with diabetic chronic kidney disease: Secondary | ICD-10-CM

## 2021-12-08 DIAGNOSIS — E782 Mixed hyperlipidemia: Secondary | ICD-10-CM

## 2021-12-08 DIAGNOSIS — E559 Vitamin D deficiency, unspecified: Secondary | ICD-10-CM

## 2021-12-08 MED ORDER — RABEPRAZOLE SODIUM 20 MG PO TBEC: 20.0000 mg | DELAYED_RELEASE_TABLET | Freq: Two times a day (BID) | ORAL | 3 refills | Status: DC

## 2021-12-11 ENCOUNTER — Ambulatory Visit (INDEPENDENT_AMBULATORY_CARE_PROVIDER_SITE_OTHER): Payer: 59 | Admitting: Internal Medicine

## 2021-12-11 ENCOUNTER — Encounter: Payer: Self-pay | Admitting: Internal Medicine

## 2021-12-11 VITALS — BP 112/76 | HR 105 | Ht 65.0 in | Wt 178.9 lb

## 2021-12-11 DIAGNOSIS — E7849 Other hyperlipidemia: Secondary | ICD-10-CM | POA: Diagnosis not present

## 2021-12-11 DIAGNOSIS — E1136 Type 2 diabetes mellitus with diabetic cataract: Secondary | ICD-10-CM | POA: Diagnosis not present

## 2021-12-11 DIAGNOSIS — E038 Other specified hypothyroidism: Secondary | ICD-10-CM

## 2021-12-11 DIAGNOSIS — E063 Autoimmune thyroiditis: Secondary | ICD-10-CM

## 2021-12-11 DIAGNOSIS — I2510 Atherosclerotic heart disease of native coronary artery without angina pectoris: Secondary | ICD-10-CM

## 2021-12-11 LAB — POCT GLYCOSYLATED HEMOGLOBIN (HGB A1C): Hemoglobin A1C: 5.9 % — AB (ref 4.0–5.6)

## 2021-12-11 MED ORDER — VICTOZA 18 MG/3ML ~~LOC~~ SOPN: 1.8000 mg | PEN_INJECTOR | Freq: Every day | SUBCUTANEOUS | 3 refills | Status: DC

## 2021-12-11 MED ORDER — SITAGLIPTIN PHOS-METFORMIN HCL 50-500 MG PO TABS: 2.0000 | ORAL_TABLET | Freq: Every day | ORAL | 3 refills | Status: DC

## 2021-12-11 MED ORDER — VICTOZA 18 MG/3ML ~~LOC~~ SOPN
1.8000 mg | PEN_INJECTOR | Freq: Every day | SUBCUTANEOUS | 3 refills | Status: DC
Start: 2021-12-11 — End: 2021-12-11

## 2021-12-11 MED ORDER — FREESTYLE LIBRE 2 SENSOR MISC: 1.0000 | 3 refills | Status: DC

## 2021-12-11 MED ORDER — FREESTYLE LIBRE 2 READER DEVI: 1.0000 | Freq: Every day | 0 refills | Status: DC
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
## 2021-12-11 NOTE — Addendum Note (Signed)
Addended by: Sarina Ill on: 12/11/2021 03:08 PM   Modules accepted: Orders

## 2021-12-11 NOTE — Patient Instructions (Addendum)
Please continue: - JanuMet 50-500 mg 2x a day, with meals - Victoza 1.2-1.8 mg before b'fast  Please continue Synthroid 137 mcg 6/7 days: every day, with water, at least 30 minutes before breakfast, separated by at least 4 hours from: - acid reflux medications - calcium - iron - multivitamins  Please return in 6 months.

## 2021-12-15 DIAGNOSIS — Z862 Personal history of diseases of the blood and blood-forming organs and certain disorders involving the immune mechanism: Secondary | ICD-10-CM | POA: Diagnosis not present

## 2021-12-15 DIAGNOSIS — E1122 Type 2 diabetes mellitus with diabetic chronic kidney disease: Secondary | ICD-10-CM | POA: Diagnosis not present

## 2021-12-15 DIAGNOSIS — E782 Mixed hyperlipidemia: Secondary | ICD-10-CM | POA: Diagnosis not present

## 2021-12-15 DIAGNOSIS — I10 Essential (primary) hypertension: Secondary | ICD-10-CM | POA: Diagnosis not present

## 2021-12-15 DIAGNOSIS — E038 Other specified hypothyroidism: Secondary | ICD-10-CM | POA: Diagnosis not present

## 2021-12-15 DIAGNOSIS — E559 Vitamin D deficiency, unspecified: Secondary | ICD-10-CM | POA: Diagnosis not present

## 2021-12-15 DIAGNOSIS — E063 Autoimmune thyroiditis: Secondary | ICD-10-CM | POA: Diagnosis not present

## 2021-12-16 DIAGNOSIS — E1122 Type 2 diabetes mellitus with diabetic chronic kidney disease: Secondary | ICD-10-CM | POA: Diagnosis not present

## 2021-12-16 DIAGNOSIS — E038 Other specified hypothyroidism: Secondary | ICD-10-CM | POA: Diagnosis not present

## 2021-12-16 DIAGNOSIS — E559 Vitamin D deficiency, unspecified: Secondary | ICD-10-CM | POA: Diagnosis not present

## 2021-12-16 DIAGNOSIS — I10 Essential (primary) hypertension: Secondary | ICD-10-CM | POA: Diagnosis not present

## 2021-12-16 DIAGNOSIS — E782 Mixed hyperlipidemia: Secondary | ICD-10-CM | POA: Diagnosis not present

## 2021-12-16 DIAGNOSIS — E063 Autoimmune thyroiditis: Secondary | ICD-10-CM | POA: Diagnosis not present

## 2021-12-16 DIAGNOSIS — Z862 Personal history of diseases of the blood and blood-forming organs and certain disorders involving the immune mechanism: Secondary | ICD-10-CM | POA: Diagnosis not present

## 2021-12-18 ENCOUNTER — Encounter: Payer: Self-pay | Admitting: Internal Medicine

## 2021-12-18 DIAGNOSIS — R0781 Pleurodynia: Secondary | ICD-10-CM

## 2021-12-21 DIAGNOSIS — M542 Cervicalgia: Secondary | ICD-10-CM | POA: Diagnosis not present

## 2021-12-21 DIAGNOSIS — M2241 Chondromalacia patellae, right knee: Secondary | ICD-10-CM | POA: Diagnosis not present

## 2021-12-21 DIAGNOSIS — M7582 Other shoulder lesions, left shoulder: Secondary | ICD-10-CM | POA: Diagnosis not present

## 2021-12-21 DIAGNOSIS — M2242 Chondromalacia patellae, left knee: Secondary | ICD-10-CM | POA: Diagnosis not present

## 2021-12-21 DIAGNOSIS — M7581 Other shoulder lesions, right shoulder: Secondary | ICD-10-CM | POA: Diagnosis not present

## 2021-12-22 ENCOUNTER — Ambulatory Visit (HOSPITAL_COMMUNITY)
Admission: RE | Admit: 2021-12-22 | Discharge: 2021-12-22 | Disposition: A | Discharge status: Home / Self Care | Payer: 59 | Source: Ambulatory Visit | Attending: Interventional Cardiology | Admitting: Interventional Cardiology

## 2021-12-22 DIAGNOSIS — I10 Essential (primary) hypertension: Secondary | ICD-10-CM | POA: Diagnosis not present

## 2021-12-22 DIAGNOSIS — I25119 Atherosclerotic heart disease of native coronary artery with unspecified angina pectoris: Secondary | ICD-10-CM | POA: Diagnosis not present

## 2021-12-22 DIAGNOSIS — E785 Hyperlipidemia, unspecified: Secondary | ICD-10-CM | POA: Diagnosis not present

## 2021-12-22 DIAGNOSIS — E119 Type 2 diabetes mellitus without complications: Secondary | ICD-10-CM | POA: Diagnosis not present

## 2021-12-22 DIAGNOSIS — I350 Nonrheumatic aortic (valve) stenosis: Secondary | ICD-10-CM | POA: Diagnosis not present

## 2021-12-22 LAB — ECHOCARDIOGRAM COMPLETE
AR max vel: 1.07 cm2
AV Area VTI: 0.88 cm2
AV Area mean vel: 1.17 cm2
AV Mean grad: 18.5 mmHg
AV Peak grad: 36.2 mmHg
Ao pk vel: 3.01 m/s
Area-P 1/2: 3.34 cm2
S' Lateral: 3.4 cm

## 2021-12-23 ENCOUNTER — Other Ambulatory Visit (HOSPITAL_COMMUNITY): Payer: Self-pay

## 2021-12-23 DIAGNOSIS — Z85828 Personal history of other malignant neoplasm of skin: Secondary | ICD-10-CM | POA: Diagnosis not present

## 2021-12-23 DIAGNOSIS — L821 Other seborrheic keratosis: Secondary | ICD-10-CM | POA: Diagnosis not present

## 2021-12-23 DIAGNOSIS — L920 Granuloma annulare: Secondary | ICD-10-CM | POA: Diagnosis not present

## 2021-12-23 DIAGNOSIS — L720 Epidermal cyst: Secondary | ICD-10-CM | POA: Diagnosis not present

## 2021-12-23 DIAGNOSIS — D225 Melanocytic nevi of trunk: Secondary | ICD-10-CM | POA: Diagnosis not present

## 2021-12-23 DIAGNOSIS — D2372 Other benign neoplasm of skin of left lower limb, including hip: Secondary | ICD-10-CM | POA: Diagnosis not present

## 2021-12-23 DIAGNOSIS — L570 Actinic keratosis: Secondary | ICD-10-CM | POA: Diagnosis not present

## 2021-12-23 DIAGNOSIS — L738 Other specified follicular disorders: Secondary | ICD-10-CM | POA: Diagnosis not present

## 2021-12-24 ENCOUNTER — Ambulatory Visit (HOSPITAL_COMMUNITY)
Admission: RE | Admit: 2021-12-24 | Discharge: 2021-12-24 | Disposition: A | Discharge status: Home / Self Care | Payer: 59 | Source: Ambulatory Visit | Attending: Internal Medicine | Admitting: Internal Medicine

## 2021-12-24 DIAGNOSIS — E038 Other specified hypothyroidism: Secondary | ICD-10-CM | POA: Diagnosis not present

## 2021-12-24 DIAGNOSIS — E1122 Type 2 diabetes mellitus with diabetic chronic kidney disease: Secondary | ICD-10-CM | POA: Diagnosis not present

## 2021-12-24 DIAGNOSIS — E063 Autoimmune thyroiditis: Secondary | ICD-10-CM | POA: Diagnosis not present

## 2021-12-24 DIAGNOSIS — R0781 Pleurodynia: Secondary | ICD-10-CM | POA: Diagnosis not present

## 2021-12-24 DIAGNOSIS — N411 Chronic prostatitis: Secondary | ICD-10-CM | POA: Diagnosis not present

## 2021-12-24 DIAGNOSIS — E7800 Pure hypercholesterolemia, unspecified: Secondary | ICD-10-CM | POA: Diagnosis not present

## 2021-12-24 DIAGNOSIS — I2510 Atherosclerotic heart disease of native coronary artery without angina pectoris: Secondary | ICD-10-CM | POA: Diagnosis not present

## 2021-12-24 DIAGNOSIS — E291 Testicular hypofunction: Secondary | ICD-10-CM | POA: Diagnosis not present

## 2021-12-24 DIAGNOSIS — Z6827 Body mass index (BMI) 27.0-27.9, adult: Secondary | ICD-10-CM | POA: Diagnosis not present

## 2021-12-24 DIAGNOSIS — N183 Chronic kidney disease, stage 3 unspecified: Secondary | ICD-10-CM | POA: Diagnosis not present

## 2021-12-24 DIAGNOSIS — N2581 Secondary hyperparathyroidism of renal origin: Secondary | ICD-10-CM | POA: Diagnosis not present

## 2021-12-24 DIAGNOSIS — C61 Malignant neoplasm of prostate: Secondary | ICD-10-CM | POA: Diagnosis not present

## 2021-12-25 DIAGNOSIS — R42 Dizziness and giddiness: Secondary | ICD-10-CM | POA: Diagnosis not present

## 2021-12-25 DIAGNOSIS — R490 Dysphonia: Secondary | ICD-10-CM | POA: Diagnosis not present

## 2021-12-25 DIAGNOSIS — J342 Deviated nasal septum: Secondary | ICD-10-CM | POA: Diagnosis not present

## 2021-12-25 DIAGNOSIS — Z87891 Personal history of nicotine dependence: Secondary | ICD-10-CM | POA: Diagnosis not present

## 2021-12-28 ENCOUNTER — Other Ambulatory Visit (HOSPITAL_COMMUNITY): Payer: 59

## 2022-01-12 NOTE — Progress Notes (Unsigned)
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      Dr. 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 later this summer and transitioning to the [facility] in [place].  He has had minimal angina after we added low-dose beta-blocker therapy.  He has noted some heart rates in the 40s.  He has had 2 occasions when he developed orthostatic dizziness, 1 of which caused him to fall.  He has been trying to sort out if this is related to vertigo which she has is a chronic problem or 2 orthostasis.  He had another episode of falling coming down a flight of stairs which he believes was related to loss of balance.  He denies orthopnea, PND, peripheral edema, and transient neurological symptoms.  Past Medical History:  Diagnosis Date   Allergy    Arthritis    Diabetes mellitus (HCC)    GERD (gastroesophageal reflux disease)    Gout    HOH (hard of hearing)    Hx of skin cancer, basal cell    Hyperlipemia    Hypertension    Hypothyroidism    Obstructive sleep apnea 11/07/2015   Pneumonia    Prostate cancer ([place])    Renal disorder    mild renal insufficiency   Rupture quadriceps tendon 09-21-13   left   Seizures ([place]) 2000   only one time-never dx why-no meds   Skin cancer    Sleep apnea    Thyroid disease    hypothyroidism   Wears glasses     Past  Surgical History:  Procedure Laterality Date   COLONOSCOPY     FACIAL FRACTURE SURGERY  1965   KNEE ARTHROSCOPY Left 09/26/2013   Procedure: LEFT KNEE ARTHROSCOPY WITH DEBRIDEMENT/SHAVING (CHONDROPLASTY), SUTURE REPAIR QUADRICEPS/HAMSTRING MUSCLE RUPTURE PRIMARY;  Surgeon: Lorn Junes, MD;  Location: [place];  Service: Orthopedics;  Laterality: Left;   KNEE SURGERY     left   LEFT HEART CATH AND CORONARY ANGIOGRAPHY N/A 06/09/2021   Procedure: LEFT HEART CATH AND CORONARY ANGIOGRAPHY;  Surgeon: Troy Sine, MD;  Location: [place] [facility];  Service: Cardiovascular;  Laterality: N/A;   LYMPHADENECTOMY Bilateral 05/05/2020   Procedure: LYMPHADENECTOMY, PELVIC;  Surgeon: Raynelle Bring, MD;  Location: [facility];  Service: Urology;  Laterality: Bilateral;   PROSTATE BIOPSY     QUADRICEPS TENDON REPAIR Left 09/26/2013   Procedure: REPAIR QUADRICEP TENDON;  Surgeon: Lorn Junes, MD;  Location: [facility];  Service: Orthopedics;  Laterality: Left;   ROBOT ASSISTED LAPAROSCOPIC RADICAL PROSTATECTOMY N/A 05/05/2020   Procedure: XI ROBOTIC ASSISTED LAPAROSCOPIC RADICAL PROSTATECTOMY LEVEL 2;  Surgeon: Raynelle Bring, MD;  Location: [facility];  Service: Urology;  Laterality: N/A;   TONSILLECTOMY  Current Medications: Current Meds  Medication Sig   Accu-Chek FastClix Lancets MISC Apply topically.   acetaminophen (TYLENOL) 500 MG tablet Take 1,000 mg by mouth at bedtime.    albuterol (VENTOLIN HFA) 108 (90 Base) MCG/ACT inhaler Inhale 2 puffs into the lungs every 6 (six) hours as needed for wheezing or shortness of breath.    allopurinol (ZYLOPRIM) 300 MG tablet TAKE 1 TABLET DAILY   aspirin EC 81 MG tablet Take 81 mg by mouth daily. One tablet by mouth daily   atorvastatin (LIPITOR) 80 MG tablet Take 1 tablet (80 mg total) by mouth daily.   atorvastatin (LIPITOR) 80 MG tablet Take 1 tablet (80 mg total) by mouth daily.   bacitracin ointment Apply 1  application topically 2 (two) times daily.   benzonatate (TESSALON) 100 MG capsule Take 1-2 capsules (100-200 mg total) by mouth 3 (three) times daily as needed for cough.   Blood Glucose Monitoring Suppl (FIFTY50 GLUCOSE METER 2.0) w/Device KIT See admin instructions.   calcium carbonate (TUMS EX) 750 MG chewable tablet Chew 2 tablets by mouth at bedtime.    Cholecalciferol 1.25 MG (50000 UT) TABS Take 50,000 Units by mouth once a week.   Continuous Blood Gluc Receiver (FREESTYLE LIBRE 2 READER) DEVI 1 each by Does not apply route daily.   Continuous Blood Gluc Sensor (FREESTYLE LIBRE 2 SENSOR) MISC 1 each by Does not apply route every 14 (fourteen) days.   COVID-19 At Home Antigen Test [facility] COVID-19 HOME TEST) KIT Use as directed   ezetimibe (ZETIA) 10 MG tablet Take 1 tablet (10 mg total) by mouth daily.   FORTESTA 10 MG/ACT (2%) GEL Apply 2 application. topically daily.   FREESTYLE LITE test strip    hydrOXYzine (ATARAX/VISTARIL) 25 MG tablet Take 1 tablet (25 mg total) by mouth at bedtime.   Insulin Pen Needle 32G X 4 MM MISC Use as directed daily   ketoconazole (NIZORAL) 2 % cream Apply 1 application topically daily as needed for irritation.   metoprolol succinate (TOPROL XL) 25 MG 24 hr tablet Take 0.5 tablets (12.5 mg total) by mouth daily.   minoxidil (ROGAINE) 2 % external solution Apply 1 application topically daily.   Multiple Vitamins-Minerals (MULTIVITAMIN ADULTS) TABS Take 1 tablet by mouth daily.   ondansetron (ZOFRAN ODT) 4 MG disintegrating tablet 24m ODT q4 hours prn nausea/vomit   RABEprazole (ACIPHEX) 20 MG tablet Take 1 tablet (20 mg total) by mouth in the morning and at bedtime.   simethicone (MYLICON) 1161MG chewable tablet Chew 375 mg by mouth at bedtime. Take 3 tablets (375 mg) at bedtime   sitaGLIPtin-metformin (JANUMET) 50-500 MG tablet Take 2 tablets by mouth daily.   SYNTHROID 137 MCG tablet Take 1 tablet (137 mcg total) by mouth daily before breakfast.    telmisartan-hydrochlorothiazide (MICARDIS HCT) 40-12.5 MG tablet Take 0.5 tablets by mouth daily. Please discard remaining portion of pill.   UNIFINE PENTIPS 32G X 4 MM MISC    VASCEPA 1 g capsule Take 2 capsules (2 g total) by mouth at bedtime.   VICTOZA 18 MG/3ML SOPN Inject 1.8 mg into the skin daily.   [DISCONTINUED] clopidogrel (PLAVIX) 75 MG tablet Take 1 tablet (75 mg total) by mouth daily.   [DISCONTINUED] metoprolol succinate (TOPROL-XL) 25 MG 24 hr tablet TAKE 1 TABLET (25 MG TOTAL) BY MOUTH DAILY.     Allergies:   Amlodipine, Atenolol, Celecoxib, Lisinopril, Nsaids, and Ranitidine hcl   Social History   Socioeconomic History   Marital  status: Married    Spouse name: Peter Brisk, RN    Number of children: 2   Years of education: Not on file   Highest education level: Not on file  Occupational History    Comment: pediatric and adult endocrinologist  Tobacco Use   Smoking status: Former    Packs/day: 0.50    Years: 19.00    Total pack years: 9.50    Types: Cigarettes    Quit date: 09/21/1972    Years since quitting: 49.3   Smokeless tobacco: Never  Vaping Use   Vaping Use: Never used  Substance and Sexual Activity   Alcohol use: No    Alcohol/week: 14.0 standard drinks of alcohol    Types: 7 Glasses of wine, 7 Shots of liquor per week    Comment: no longer drinks   Drug use: No   Sexual activity: Not Currently  Other Topics Concern   Not on file  Social History Narrative   Not on file   Social Determinants of Health   Financial Resource Strain: Not on file  Food Insecurity: Not on file  Transportation Needs: Not on file  Physical Activity: Not on file  Stress: Not on file  Social Connections: Not on file     Family History: The patient's family history includes Breast cancer in his mother and sister; Diabetes in an other family member; Heart disease in his father and mother; Hypertension in an other family member. There is no history of Esophageal  cancer, Rectal cancer, Stomach cancer, Prostate cancer, Pancreatic cancer, or Colon cancer.  ROS:   Please see the history of present illness.    Chronic difficulty with hearing and vertigo.  Wears hearing aids.  All other systems reviewed and are negative.  EKGs/Labs/Other Studies Reviewed:    The following studies were reviewed today: 2D Doppler echocardiogram 12/22/2021: IMPRESSIONS   1. Left ventricular ejection fraction, by estimation, is 55 to 60%. The  left ventricle has normal function. The left ventricle has no regional  wall motion abnormalities. Left ventricular diastolic parameters are  consistent with Grade I diastolic  dysfunction (impaired relaxation).   2. Right ventricular systolic function is normal. The right ventricular  size is normal. There is normal pulmonary artery systolic pressure.   3. Left atrial size was mild to moderately dilated.   4. Right atrial size was mild to moderately dilated.   5. The mitral valve is normal in structure. Trivial mitral valve  regurgitation. No evidence of mitral stenosis.   6. The aortic valve is moderately calcified. The right coroanry cusp is  fused. The aortic valve is tricuspid. There is moderate calcification of  the aortic valve. Aortic valve regurgitation is trivial. Moderate aortic  valve stenosis. Aortic valve area,  by VTI measures 0.88 cm. Aortic valve mean gradient measures 18.5 mmHg.  Aortic valve Vmax measures 3.01 m/s.   7. The inferior vena cava is dilated in size with >50% respiratory  variability, suggesting right atrial pressure of 8 mmHg.   CARDIAC CATHETERIZATION 06/09/2021:  Mild to moderate CAD with very small first diagonal vessel having 70% ostial stenosis, and 65 to 70% LAD stenosis after the second diagonal vessel; mild nonobstructive plaque in the dominant left circumflex vessel and small nondominant RCA.   There is a 10 to 15 mm to peak aortic gradient on LV to AO pullback which was variable due to  the presence of occasional PACs.   LVEDP 13 mmHg.   RECOMMENDATION: The catheterization findings were  discussed with Dr. Tamala Julian.  Plan for initial increased medical therapy trial.   Aggressive lipid-lowering therapy with target LDL less than 55.  Hydrate post catheterization with CKD stage IIIb. EKG:  EKG not repeated  Recent Labs: 04/15/2021: TSH 2.33 06/05/2021: Hemoglobin 16.1; Platelets 241 06/29/2021: ALT 32; BUN 35; Creatinine, Ser 1.62; Potassium 4.0; Sodium 143  Recent Lipid Panel    Component Value Date/Time   CHOL 114 06/29/2021 0849   TRIG 100 06/29/2021 0849   HDL 45 06/29/2021 0849   CHOLHDL 2.5 06/29/2021 0849   CHOLHDL 4.6 04/15/2021 0820   LDLCALC 50 06/29/2021 0849   LDLCALC 161 (H) 04/15/2021 0820    Physical Exam:    VS:  BP 100/60   Pulse 60   Ht _0  (1.651 m)   Wt 177 lb 12.8 oz (80.6 kg)   SpO2 95%   BMI 29.59 kg/m     Wt Readings from Last 3 Encounters:  01/13/22 177 lb 12.8 oz (80.6 kg)  12/11/21 178 lb 14.4 oz (81.1 kg)  09/25/21 182 lb 12 oz (82.9 kg)    Blood pressure sitting 130/70 mmHg with heart rate 58 bpm; blood pressure standing 118/65 mmHg heart rate 66 bpm.  GEN: Slightly overweight. No acute distress HEENT: Normal NECK: No JVD. LYMPHATICS: No lymphadenopathy CARDIAC: 2/6 right upper sternal systolic without diastolic aortic valve murmur. RRR S4 gallop, or edema. VASCULAR:  Normal Pulses. No bruits. RESPIRATORY:  Clear to auscultation without rales, wheezing or rhonchi  ABDOMEN: Soft, non-tender, non-distended, No pulsatile mass, MUSCULOSKELETAL: No deformity  SKIN: Warm and dry NEUROLOGIC:  Alert and oriented x 3 PSYCHIATRIC:  Normal affect   ASSESSMENT:    1. Coronary artery disease of native artery of native heart with stable angina pectoris ([place])   2. Hyperlipidemia LDL goal <70   3. Moderate aortic stenosis   4. Primary hypertension   5. Type 2 diabetes mellitus with stage 3a chronic kidney disease, without long-term  current use of insulin (HCC)   6. Palpitations    PLAN:    In order of problems listed above:  Moderate LAD and diagonal disease treated medically.  Continue statin therapy clopidogrel therapy and aggressive lipid-lowering.  LDL was 50 in February 2023.  Recent blood work has been done but we have not received it from [facility] labs. Continue current statin dose. We will need to have an echocardiogram done again in August 2024. Orthostatic blood pressures as noted.  I would be in favor of discontinuation of telmisartan HCTZ or at the very least eliminating HCTZ and continuing telmisartan.  Would also be in favor of adding Jardiance/Farxiga for kidney protection.  We will also decrease Toprol-XL to 12.5 mg/day. Consider SGLT2 therapy. Consider long-term monitoring if palpitations continue.  This will be to rule out atrial fibrillation.  Overall education and awareness concerning primary/secondary risk prevention was discussed in detail: LDL less than 70, hemoglobin A1c less than 7, blood pressure target less than 130/80 mmHg, >150 minutes of moderate aerobic activity per week, avoidance of smoking, weight control (via diet and exercise), and continued surveillance/management of/for obstructive sleep apnea.    Medication Adjustments/Labs and Tests Ordered: Current medicines are reviewed at length with the patient today.  Concerns regarding medicines are outlined above.  No orders of the defined types were placed in this encounter.  Meds ordered this encounter  Medications   clopidogrel (PLAVIX) 75 MG tablet    Sig: Take 1 tablet (75 mg total) by mouth daily.  Dispense:  90 tablet    Refill:  3   metoprolol succinate (TOPROL XL) 25 MG 24 hr tablet    Sig: Take 0.5 tablets (12.5 mg total) by mouth daily.    Dispense:  45 tablet    Refill:  3    Dose change    Patient Instructions  Medication Instructions:  Your physician has recommended you make the following change in your medication:    1) DECREASE Metoprolol succinate (Toprol XL) to 12.69m daily  *If you need a refill on your cardiac medications before your next appointment, please call your pharmacy*  Lab Work: NONE  Testing/Procedures: NONE  Follow-Up: [facility] and [facility] needs [facility] our [facility].  As part of our continuing mission to provide you with exceptional heart care, we have created designated Provider Care Teams.  These Care Teams include your primary Cardiologist (physician) and Advanced Practice Providers (APPs -  Physician Assistants and Nurse Practitioners) who all work together to provide you with the care you need, when you need it.  Your next appointment:   4 month(s)  The format for your next appointment:   In Person  Provider:   HSinclair Grooms MD   Important Information About Sugar         Signed, HSinclair Grooms MD  01/13/2022 12:43 PM    [facility]

## 2022-01-13 ENCOUNTER — Ambulatory Visit: Payer: 59 | Attending: Interventional Cardiology | Admitting: Interventional Cardiology

## 2022-01-13 ENCOUNTER — Encounter: Payer: Self-pay | Admitting: Interventional Cardiology

## 2022-01-13 VITALS — BP 100/60 | HR 60 | Ht 65.0 in | Wt 177.8 lb

## 2022-01-13 DIAGNOSIS — I350 Nonrheumatic aortic (valve) stenosis: Secondary | ICD-10-CM | POA: Diagnosis not present

## 2022-01-13 DIAGNOSIS — I25118 Atherosclerotic heart disease of native coronary artery with other forms of angina pectoris: Secondary | ICD-10-CM | POA: Diagnosis not present

## 2022-01-13 DIAGNOSIS — E1122 Type 2 diabetes mellitus with diabetic chronic kidney disease: Secondary | ICD-10-CM | POA: Diagnosis not present

## 2022-01-13 DIAGNOSIS — E785 Hyperlipidemia, unspecified: Secondary | ICD-10-CM | POA: Diagnosis not present

## 2022-01-13 DIAGNOSIS — I2510 Atherosclerotic heart disease of native coronary artery without angina pectoris: Secondary | ICD-10-CM

## 2022-01-13 DIAGNOSIS — R002 Palpitations: Secondary | ICD-10-CM | POA: Diagnosis not present

## 2022-01-13 DIAGNOSIS — N1831 Chronic kidney disease, stage 3a: Secondary | ICD-10-CM | POA: Diagnosis not present

## 2022-01-13 DIAGNOSIS — I10 Essential (primary) hypertension: Secondary | ICD-10-CM

## 2022-01-13 MED ORDER — METOPROLOL SUCCINATE ER 25 MG PO TB24: 12.5000 mg | ORAL_TABLET | Freq: Every day | ORAL | 3 refills | Status: DC

## 2022-01-13 MED ORDER — CLOPIDOGREL BISULFATE 75 MG PO TABS: 75.0000 mg | ORAL_TABLET | Freq: Every day | ORAL | 3 refills | Status: DC

## 2022-01-13 NOTE — Patient Instructions (Signed)
Medication Instructions:  Your physician has recommended you make the following change in your medication:   1) DECREASE Metoprolol succinate (Toprol XL) to 12.'5mg'$  daily  *If you need a refill on your cardiac medications before your next appointment, please call your pharmacy*  Lab Work: NONE  Testing/Procedures: NONE  Follow-Up: At [facility], you and your health needs are our priority.  As part of our continuing mission to provide you with exceptional heart care, we have created designated Provider Care Teams.  These Care Teams include your primary Cardiologist (physician) and Advanced Practice Providers (APPs -  Physician Assistants and Nurse Practitioners) who all work together to provide you with the care you need, when you need it.  Your next appointment:   4 month(s)  The format for your next appointment:   In Person  Provider:   Sinclair Grooms, MD   Important Information About Sugar

## 2022-01-14 ENCOUNTER — Telehealth: Payer: Self-pay | Admitting: *Deleted

## 2022-01-14 NOTE — Telephone Encounter (Signed)
Dr. Tamala Julian received pt's lab results.  Per Dr. Tamala Julian, call placed to pt.  He has been made aware that we received his labs, his A1C is down 5.8 and CKD Stage 3 is stable.  He was appreciative of the call.

## 2022-01-19 ENCOUNTER — Encounter: Payer: Self-pay | Admitting: Internal Medicine

## 2022-01-19 MED ORDER — INSULIN PEN NEEDLE 32G X 4 MM MISC: 0 refills | Status: DC

## 2022-01-19 MED ORDER — ATORVASTATIN CALCIUM 80 MG PO TABS: 80.0000 mg | ORAL_TABLET | Freq: Every day | ORAL | 3 refills | Status: DC

## 2022-01-19 NOTE — Telephone Encounter (Signed)
Rx's have been sent to the pharmacy

## 2022-01-22 ENCOUNTER — Other Ambulatory Visit (HOSPITAL_COMMUNITY): Payer: Self-pay

## 2022-01-22 ENCOUNTER — Other Ambulatory Visit: Payer: Self-pay | Admitting: Internal Medicine

## 2022-02-05 DIAGNOSIS — C61 Malignant neoplasm of prostate: Secondary | ICD-10-CM | POA: Diagnosis not present

## 2022-02-08 ENCOUNTER — Other Ambulatory Visit (HOSPITAL_COMMUNITY): Payer: Self-pay

## 2022-02-08 ENCOUNTER — Telehealth: Payer: Self-pay

## 2022-02-08 ENCOUNTER — Encounter: Payer: Self-pay | Admitting: Internal Medicine

## 2022-02-08 MED ORDER — VASCEPA 1 G PO CAPS
2.0000 g | ORAL_CAPSULE | Freq: Every day | ORAL | 3 refills | Status: DC
  Filled 2022-02-08: qty 180, 90d supply, fill #0

## 2022-02-08 MED ORDER — TELMISARTAN-HCTZ 40-12.5 MG PO TABS
0.5000 | ORAL_TABLET | Freq: Every day | ORAL | 3 refills | Status: DC
  Filled 2022-02-08: qty 45, 90d supply, fill #0
  Filled 2022-02-09: qty 30, 30d supply, fill #0

## 2022-02-08 MED ORDER — ATORVASTATIN CALCIUM 80 MG PO TABS
80.0000 mg | ORAL_TABLET | Freq: Every day | ORAL | 3 refills | Status: DC
  Filled 2022-02-08: qty 90, 90d supply, fill #0

## 2022-02-08 MED ORDER — SITAGLIPTIN PHOS-METFORMIN HCL 50-500 MG PO TABS
2.0000 | ORAL_TABLET | Freq: Every day | ORAL | 3 refills | Status: DC
  Filled 2022-02-08: qty 180, 90d supply, fill #0

## 2022-02-08 MED ORDER — EZETIMIBE 10 MG PO TABS
10.0000 mg | ORAL_TABLET | Freq: Every day | ORAL | 3 refills | Status: DC
  Filled 2022-02-08: qty 90, 90d supply, fill #0

## 2022-02-08 MED ORDER — HYDROXYZINE HCL 25 MG PO TABS
25.0000 mg | ORAL_TABLET | Freq: Every day | ORAL | 3 refills | Status: DC
  Filled 2022-02-08: qty 90, 90d supply, fill #0

## 2022-02-08 MED ORDER — FORTESTA 10 MG/ACT (2%) TD GEL: 2.0000 "application " | Freq: Every day | TRANSDERMAL | 3 refills | Status: DC

## 2022-02-08 MED ORDER — VICTOZA 18 MG/3ML ~~LOC~~ SOPN
1.8000 mg | PEN_INJECTOR | Freq: Every day | SUBCUTANEOUS | 3 refills | Status: DC
  Filled 2022-02-08: qty 9, 30d supply, fill #0

## 2022-02-08 MED ORDER — CLOPIDOGREL BISULFATE 75 MG PO TABS
75.0000 mg | ORAL_TABLET | Freq: Every day | ORAL | 3 refills | Status: DC
  Filled 2022-02-08: qty 90, 90d supply, fill #0

## 2022-02-08 MED ORDER — MINOXIDIL 2 % EX SOLN
1.0000 "application " | Freq: Every day | CUTANEOUS | 3 refills | Status: DC
  Filled 2022-02-08: qty 60, fill #0

## 2022-02-08 MED ORDER — NITROGLYCERIN 0.4 MG SL SUBL
0.4000 mg | SUBLINGUAL_TABLET | SUBLINGUAL | 3 refills | Status: AC | PRN
  Filled 2022-02-08: qty 25, 1d supply, fill #0

## 2022-02-08 MED ORDER — KETOCONAZOLE 2 % EX CREA
1.0000 | TOPICAL_CREAM | Freq: Every day | CUTANEOUS | 3 refills | Status: AC | PRN
  Filled 2022-02-08: qty 15, 30d supply, fill #0

## 2022-02-08 MED ORDER — TADALAFIL 20 MG PO TABS
20.0000 mg | ORAL_TABLET | Freq: Every day | ORAL | 3 refills | Status: DC | PRN
  Filled 2022-02-08: qty 10, 10d supply, fill #0

## 2022-02-08 MED ORDER — CHOLECALCIFEROL 1.25 MG (50000 UT) PO CAPS
50000.0000 [IU] | ORAL_CAPSULE | ORAL | 3 refills | Status: DC
  Filled 2022-02-08: qty 12, 84d supply, fill #0

## 2022-02-08 MED ORDER — ALLOPURINOL 300 MG PO TABS
300.0000 mg | ORAL_TABLET | Freq: Every day | ORAL | 3 refills | Status: DC
  Filled 2022-02-08: qty 90, 90d supply, fill #0

## 2022-02-08 MED ORDER — SYNTHROID 137 MCG PO TABS
137.0000 ug | ORAL_TABLET | Freq: Every day | ORAL | 3 refills | Status: DC
  Filled 2022-02-08: qty 90, 90d supply, fill #0

## 2022-02-08 MED ORDER — METOPROLOL SUCCINATE ER 25 MG PO TB24
12.5000 mg | ORAL_TABLET | Freq: Every day | ORAL | 3 refills | Status: DC
  Filled 2022-02-08: qty 45, 90d supply, fill #0

## 2022-02-08 NOTE — Telephone Encounter (Signed)
Per patient request all Rx was sent to [facility]

## 2022-02-09 ENCOUNTER — Other Ambulatory Visit (HOSPITAL_COMMUNITY): Payer: Self-pay

## 2022-02-09 DIAGNOSIS — E291 Testicular hypofunction: Secondary | ICD-10-CM | POA: Insufficient documentation

## 2022-02-09 MED ORDER — TESTOSTERONE 10 MG/ACT (2%) TD GEL
2.0000 "application " | Freq: Every day | TRANSDERMAL | 0 refills | Status: DC
  Filled 2022-02-09: qty 60, 30d supply, fill #0
  Filled 2022-02-09: qty 60, 25d supply, fill #0

## 2022-02-12 ENCOUNTER — Other Ambulatory Visit: Payer: Self-pay

## 2022-02-12 ENCOUNTER — Other Ambulatory Visit (HOSPITAL_COMMUNITY): Payer: Self-pay

## 2022-02-12 DIAGNOSIS — E1136 Type 2 diabetes mellitus with diabetic cataract: Secondary | ICD-10-CM

## 2022-02-12 MED ORDER — SYNTHROID 137 MCG PO TABS: 137.0000 ug | ORAL_TABLET | Freq: Every day | ORAL | 3 refills | Status: DC

## 2022-02-12 MED ORDER — FREESTYLE LIBRE 2 SENSOR MISC: 1.0000 | 3 refills | Status: DC

## 2022-02-16 ENCOUNTER — Telehealth: Payer: Self-pay

## 2022-02-16 ENCOUNTER — Other Ambulatory Visit (HOSPITAL_COMMUNITY): Payer: Self-pay

## 2022-02-16 NOTE — Telephone Encounter (Signed)
Patient Advocate Encounter   Received notification that prior authorization for FreeStyle [place] 2 Reader device is required/requested.   PA submitted on 02/16/22 to [employer] via CoverMyMeds Key [id number]  Status is pending

## 2022-02-16 NOTE — Telephone Encounter (Signed)
Patient Advocate Encounter   Received notification that prior authorization for FreeStyle [place] 2 Sensor is required/requested.    PA submitted on 02/16/22 to [employer] via CoverMyMeds Key [id number]  Status is pending

## 2022-02-17 NOTE — Telephone Encounter (Signed)
Patient Advocate Encounter  Received notification from [employer] that the request for prior authorization for FreeStyle [place] 2 Reader device has been denied  Key: [id number]

## 2022-02-17 NOTE — Telephone Encounter (Signed)
Patient Advocate Encounter  Received notification from [employer] that the request for prior authorization for FreeStyle Libre 2 Sensor has been denied   Key: [id number]

## 2022-02-18 ENCOUNTER — Other Ambulatory Visit (HOSPITAL_COMMUNITY): Payer: Self-pay

## 2022-02-18 ENCOUNTER — Other Ambulatory Visit: Payer: Self-pay | Admitting: Internal Medicine

## 2022-02-26 DIAGNOSIS — N393 Stress incontinence (female) (male): Secondary | ICD-10-CM | POA: Diagnosis not present

## 2022-02-26 DIAGNOSIS — C61 Malignant neoplasm of prostate: Secondary | ICD-10-CM | POA: Diagnosis not present

## 2022-02-26 DIAGNOSIS — N3941 Urge incontinence: Secondary | ICD-10-CM | POA: Diagnosis not present

## 2022-02-26 DIAGNOSIS — E291 Testicular hypofunction: Secondary | ICD-10-CM | POA: Diagnosis not present

## 2022-03-02 DIAGNOSIS — M542 Cervicalgia: Secondary | ICD-10-CM | POA: Diagnosis not present

## 2022-03-02 DIAGNOSIS — M4722 Other spondylosis with radiculopathy, cervical region: Secondary | ICD-10-CM | POA: Diagnosis not present

## 2022-03-15 ENCOUNTER — Other Ambulatory Visit (HOSPITAL_COMMUNITY): Payer: Self-pay

## 2022-03-15 DIAGNOSIS — M4722 Other spondylosis with radiculopathy, cervical region: Secondary | ICD-10-CM | POA: Diagnosis not present

## 2022-03-15 DIAGNOSIS — M542 Cervicalgia: Secondary | ICD-10-CM | POA: Diagnosis not present

## 2022-03-24 ENCOUNTER — Ambulatory Visit: Payer: Self-pay | Admitting: Surgery

## 2022-03-24 ENCOUNTER — Telehealth: Payer: Self-pay | Admitting: *Deleted

## 2022-03-24 DIAGNOSIS — K429 Umbilical hernia without obstruction or gangrene: Secondary | ICD-10-CM | POA: Diagnosis not present

## 2022-03-24 NOTE — Telephone Encounter (Signed)
   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 Armandina Gemma Surgeon's Group or Practice Name:  [facility] [place] Phone number:  [phone] Fax number:  [phone] ATTN: Mammie Lorenzo, LPN   Type of Clearance Requested:   - Medical  - Pharmacy:  Hold Clopidogrel (Plavix)     Type of Anesthesia:  General    Additional requests/questions:    Jiles Prows   03/24/2022, 1:13 PM

## 2022-03-24 NOTE — Telephone Encounter (Signed)
   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 Please obtain consent and complete medication review. Thank you for your help.  I confirm that guidance regarding antiplatelet and oral anticoagulation therapy has been completed and, if necessary, noted below.  His Plavix may be held for 5 days prior to his procedure.  Please resume as soon as hemostasis is achieved   Deberah Pelton, NP 03/24/2022, 1:27 PM [place]

## 2022-03-24 NOTE — Telephone Encounter (Signed)
PT HAS APPT WITH DR. Tamala Julian 03/29/22. I HAVE ADDED PRE OP CLEARANCE NEEDED TO APPT NOTES

## 2022-03-26 ENCOUNTER — Encounter: Payer: Medicare Other | Admitting: Internal Medicine

## 2022-03-27 NOTE — Progress Notes (Addendum)
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   He has occasional episodes of fatigue that have been difficult to figure out.  Most of the time he feels great.  Under performing relative to physical activity.  Says he may walk once per week up to 2 miles without angina or excessive shortness of breath.  The angina he was having a year ago that led to coronary angiography has completely resolved.  He has upcoming umbilical hernia repair with Dr. Harlow Asa.  Dr. Harlow Asa became concerned when he heard the systolic murmur of the aortic stenosis.    Past Medical History:  Diagnosis Date   Anemia associated with chronic renal failure    Anticoagulated    plavix/  asa--- managed by cardiology   Aortic stenosis, moderate    followed by dr h. Tamala Julian---  last echo 08/ 2023  valve area 0.887cm^2,  mean grandient 18.29mHg   Chronic gout    followed by pcp   (04-12-2022  per pt many yrs since last flare-up)   CKD (chronic kidney disease), stage III ([facility]    nephrologist--- dr cMarval Regal  Coronary artery disease 05/2021   cardiologist--- dr h. 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    endocrinologist--- dr gRenne Crigler  Malignant neoplasm prostate ([facility] 02/2020   urologist--- dr bAlinda Money   dx 10/ 2021, gleason 7;   05-05-2020  s/p prostatectomy   OA (osteoarthritis)    OSA on CPAP 2017   per pt uses nightly  (study in epic 11-23-2015  mild osa   PAC (premature atrial contraction)    [place]    Type 2 diabetes mellitus ([place]    followed by dr gRenne Crigler   (04-12-2022  per pt does not check blood sugar)   Umbilical hernia    Wears glasses    Wears hearing aid in both ears     Past Surgical History:  Procedure Laterality Date   COLONOSCOPY     COLONOSCOPY WITH PROPOFOL  2018   dr pHenrene Pastor  FACIAL FRACTURE SURGERY  1965   per pt no hardware   KNEE ARTHROSCOPY Left 09/26/2013   Procedure: LEFT KNEE ARTHROSCOPY WITH DEBRIDEMENT/SHAVING (CHONDROPLASTY), SUTURE REPAIR QUADRICEPS/HAMSTRING MUSCLE RUPTURE PRIMARY;  Surgeon: RLorn Junes MD;  [place] [facility]  Service: Orthopedics;  Laterality: Left;  KNEE ARTHROSCOPY Left 1999   LEFT HEART CATH AND CORONARY ANGIOGRAPHY N/A 06/09/2021   Procedure: LEFT HEART CATH AND CORONARY ANGIOGRAPHY;  Surgeon: Troy Sine, MD;  Location: [place] [facility];  Service: Cardiovascular;  Laterality: N/A;   LYMPHADENECTOMY Bilateral 05/05/2020   Procedure: LYMPHADENECTOMY, PELVIC;  Surgeon: Raynelle Bring, MD;  Location: [facility];  Service: Urology;  Laterality: Bilateral;   QUADRICEPS TENDON REPAIR Left 09/26/2013   Procedure: REPAIR QUADRICEP TENDON;  Surgeon: Lorn Junes, MD;  Location: [place];  Service: Orthopedics;  Laterality: Left;   ROBOT ASSISTED LAPAROSCOPIC RADICAL PROSTATECTOMY N/A 05/05/2020   Procedure: XI ROBOTIC ASSISTED LAPAROSCOPIC RADICAL PROSTATECTOMY LEVEL 2;  Surgeon: Raynelle Bring, MD;  Location: [facility];  Service: Urology;  Laterality: N/A;   TONSILLECTOMY     child    Current Medications: Current Meds  Medication Sig   Accu-Chek FastClix Lancets MISC Apply topically.   acetaminophen (TYLENOL) 500 MG tablet Take 1,000 mg by mouth at bedtime.    albuterol (VENTOLIN HFA) 108 (90 Base) MCG/ACT inhaler Inhale 2 puffs into the lungs every 6 (six) hours as needed for wheezing or shortness of breath.  (Patient not taking: Reported on 04/12/2022)   allopurinol (ZYLOPRIM) 300 MG tablet Take 1 tablet (300 mg total) by mouth daily. (Patient taking differently: Take 300 mg by mouth at bedtime.)   aspirin EC 81 MG tablet Take 81 mg by mouth daily. One tablet by mouth daily   atorvastatin (LIPITOR) 80 MG tablet Take 1 tablet (80 mg total) by mouth daily. (Patient taking differently: Take 80 mg by mouth at bedtime.)   azelastine (ASTELIN) 0.1 % nasal spray 1-2 puffs each nostril once or twice daily as needed (Patient taking differently: Place 2 sprays into both nostrils as needed. 1-2 puffs each nostril once or twice daily as needed)   bacitracin ointment Apply 1 application topically 2 (two) times daily. (Patient not taking: Reported on 04/12/2022)   BD PEN NEEDLE NANO 2ND GEN 32G X 4 MM MISC USE AS DIRECTED   benzonatate (TESSALON) 100 MG capsule Take 1-2 capsules (100-200 mg total) by mouth 3 (three) times daily as needed for cough. (Patient not taking: Reported on 04/12/2022)   Blood Glucose Monitoring Suppl (FIFTY50 GLUCOSE METER 2.0) w/Device KIT See admin instructions.   calcium carbonate (TUMS EX) 750 MG chewable tablet Chew 2 tablets by mouth at bedtime as needed.   Cholecalciferol 1.25 MG (50000 UT) capsule Take 1 capsule (50,000 Units total) by mouth once a week. (Patient taking differently: Take 50,000 Units by mouth once a week. Sunday's)   clopidogrel (PLAVIX) 75 MG tablet Take 1 tablet (75 mg total) by mouth daily.   Continuous Blood Gluc Receiver (FREESTYLE LIBRE 2 READER) DEVI 1 each by  Does not apply route daily.   Continuous Blood Gluc Sensor (FREESTYLE LIBRE 2 SENSOR) MISC 1 each by Does not apply route every 14 (fourteen) days.   ezetimibe (ZETIA) 10 MG tablet Take 1 tablet (10 mg total) by mouth daily. (Patient taking differently: Take 10 mg by mouth daily.)   FORTESTA 10 MG/ACT (2%) GEL Apply 2 application  topically daily. (Patient taking differently: Apply 2 application  topically daily.)   FREESTYLE LITE test strip    HYDROcodone bit-homatropine (HYCODAN) 5-1.5 MG/5ML syrup Take 5 mLs by mouth every 8 (eight) hours as needed for cough. (Patient not taking: Reported on 04/12/2022)   hydrOXYzine (ATARAX) 25 MG tablet Take 1 tablet (25 mg total) by mouth  at bedtime. (Patient taking differently: Take 75 mg by mouth at bedtime.)   ketoconazole (NIZORAL) 2 % cream Apply 1 Application topically daily as needed for irritation.   metoprolol succinate (TOPROL-XL) 25 MG 24 hr tablet Take 12.5 mg by mouth daily.   minoxidil (ROGAINE) 2 % external solution Apply 1 application  topically daily. (Patient taking differently: Apply 1 application  topically every other day.)   Multiple Vitamins-Minerals (MULTIVITAMIN ADULTS) TABS Take 1 tablet by mouth at bedtime.   nitroGLYCERIN (NITROSTAT) 0.4 MG SL tablet Place 1 tablet (0.4 mg total) under the tongue every 5 (five) minutes as needed for chest pain.   RABEprazole (ACIPHEX) 20 MG tablet Take 1 tablet (20 mg total) by mouth in the morning and at bedtime. (Patient taking differently: Take 20 mg by mouth in the morning and at bedtime.)   simethicone (MYLICON) 785 MG chewable tablet Chew 375 mg by mouth at bedtime. Take 3 tablets (375 mg) at bedtime   sitaGLIPtin-metformin (JANUMET) 50-500 MG tablet Take 2 tablets by mouth daily. (Patient taking differently: Take 2 tablets by mouth daily.)   SYNTHROID 137 MCG tablet Take 1 tablet (137 mcg total) by mouth daily before breakfast. (Patient taking differently: Take 137 mcg by mouth daily before  breakfast.)   telmisartan-hydrochlorothiazide (MICARDIS HCT) 40-12.5 MG tablet Take 0.5 tablets by mouth daily. Please discard remaining portion of pill. (Patient taking differently: Take 0.25 tablets by mouth daily. Please discard remaining portion of pill.)   UNIFINE PENTIPS 32G X 4 MM MISC    VASCEPA 1 g capsule Take 2 capsules (2 g total) by mouth at bedtime.   VICTOZA 18 MG/3ML SOPN Inject 1.8 mg into the skin daily. (Patient taking differently: Inject 1.8 mg into the skin daily.)   [DISCONTINUED] COVID-19 At Home Antigen Test (CARESTART COVID-19 HOME TEST) KIT Use as directed   [DISCONTINUED] ondansetron (ZOFRAN ODT) 4 MG disintegrating tablet 86m ODT q4 hours prn nausea/vomit (Patient taking differently: Take 4 mg by mouth. 477mODT q4 hours prn nausea/vomit)   [DISCONTINUED] telmisartan-hydrochlorothiazide (MICARDIS HCT) 40-12.5 MG tablet Take 1 tablet by mouth daily. Pt takes 0.25 mg daily.     Allergies:   Amlodipine, Atenolol, Lisinopril, Nsaids, and Ranitidine hcl   Social History   Socioeconomic History   Marital status: Married    Spouse name: BeSaiquan HandsRN    Number of children: 2   Years of education: Not on file   Highest education level: Not on file  Occupational History    Comment: pediatric and adult endocrinologist  Tobacco Use   Smoking status: Former    Packs/day: 0.50    Years: 19.00    Total pack years: 9.50    Types: Cigarettes    Quit date: 09/21/1972    Years since quitting: 49.5   Smokeless tobacco: Never  Vaping Use   Vaping Use: Never used  Substance and Sexual Activity   Alcohol use: No    Alcohol/week: 14.0 standard drinks of alcohol    Types: 7 Glasses of wine, 7 Shots of liquor per week   Drug use: Never   Sexual activity: Not on file  Other Topics Concern   Not on file  Social History Narrative   Not on file   Social Determinants of Health   Financial Resource Strain: Not on file  Food Insecurity: Not on file  Transportation  Needs: Not on file  Physical Activity: Not on file  Stress: Not on file  Social Connections: Not on file  Family History: The patient's family history includes Breast cancer in his mother and sister; Diabetes in an other family member; Heart disease in his father and mother; Hypertension in an other family member. There is no history of Esophageal cancer, Rectal cancer, Stomach cancer, Prostate cancer, Pancreatic cancer, or Colon cancer.  ROS:   Please see the history of present illness.    Occasional palpitations but none prolonged.  Prior long-term monitor because of palpitations.  All other systems reviewed and are negative.  EKGs/Labs/Other Studies Reviewed:    The following studies were reviewed today: Creatinine 2.1 March 16, 2022 Prolonged monitor 2022: Study Highlights    NSR Tachycardia Bradycardia syndrome No atrial fibrillation No excessive pauses No ventricular tachycardia Frequent PAC's Frequent non-sustained ectopic atrial tachycardia that correlates with symptoms/events   ECHO 12/2021: IMPRESSIONS   1. Left ventricular ejection fraction, by estimation, is 55 to 60%. The  left ventricle has normal function. The left ventricle has no regional  wall motion abnormalities. Left ventricular diastolic parameters are  consistent with Grade I diastolic  dysfunction (impaired relaxation).   2. Right ventricular systolic function is normal. The right ventricular  size is normal. There is normal pulmonary artery systolic pressure.   3. Left atrial size was mild to moderately dilated.   4. Right atrial size was mild to moderately dilated.   5. The mitral valve is normal in structure. Trivial mitral valve  regurgitation. No evidence of mitral stenosis.   6. The aortic valve is moderately calcified. The right coroanry cusp is  fused. The aortic valve is tricuspid. There is moderate calcification of  the aortic valve. Aortic valve regurgitation is trivial. Moderate  aortic  valve stenosis. Aortic valve area,  by VTI measures 0.88 cm. Aortic valve mean gradient measures 18.5 mmHg.  Aortic valve Vmax measures 3.01 m/s.   7. The inferior vena cava is dilated in size with >50% respiratory  variability, suggesting right atrial pressure of 8 mmHg.   CORONARY ANGIO 05/2021: Diagnostic Dominance: Co-dominant    EKG:  EKG today's tracing demonstrates sinus bradycardia, occasional PAC, normal PR, no acute abnormality.  EKG from February demonstrating Sinus bradycardia, normal PR interval, and brief ectopic atrial pacemaker.  There is less atrial activity on the current EKG.  Recent Labs: 04/15/2021: TSH 2.33 06/05/2021: Hemoglobin 16.1; Platelets 241 06/29/2021: ALT 32; BUN 35; Creatinine, Ser 1.62; Potassium 4.0; Sodium 143  Recent Lipid Panel    Component Value Date/Time   CHOL 114 06/29/2021 0849   TRIG 100 06/29/2021 0849   HDL 45 06/29/2021 0849   CHOLHDL 2.5 06/29/2021 0849   CHOLHDL 4.6 04/15/2021 0820   LDLCALC 50 06/29/2021 0849   LDLCALC 161 (H) 04/15/2021 0820    Physical Exam:    VS:  BP 104/62   Pulse (!) 51   Ht _0  (1.651 m)   Wt 176 lb 9.6 oz (80.1 kg)   SpO2 96%   BMI 29.39 kg/m     Wt Readings from Last 3 Encounters:  03/29/22 176 lb 9.6 oz (80.1 kg)  01/13/22 177 lb 12.8 oz (80.6 kg)  12/11/21 178 lb 14.4 oz (81.1 kg)     GEN: Overweight. No acute distress HEENT: Normal NECK: No JVD. LYMPHATICS: No lymphadenopathy CARDIAC: 3/6 right upper sternal crescendo decrescendo aortic stenosis murmur that radiates into the carotids bilaterally.. RRR S4 but no S gallop, or edema. VASCULAR:  Normal Pulses. No bruits. RESPIRATORY:  Clear to auscultation without rales, wheezing or rhonchi  ABDOMEN: Soft,  non-tender, non-distended, No pulsatile mass, MUSCULOSKELETAL: No deformity  SKIN: Warm and dry NEUROLOGIC:  Alert and oriented x 3 PSYCHIATRIC:  Normal affect   ASSESSMENT:    1. Coronary artery disease of native artery  of native heart with stable angina pectoris ([place])   2. Hyperlipidemia LDL goal <70   3. Moderate aortic stenosis   4. Primary hypertension   5. Type 2 diabetes mellitus with stage 3a chronic kidney disease, without long-term current use of insulin ([employer])   6. Obstructive sleep apnea   7. Preoperative clearance    PLAN:    In order of problems listed above:  De-escalate dual antiplatelet therapy to clopidogrel 75 mg/day.  Okay to hold clopidogrel as needed prior to umbilical hernia repair and resume when safe.  Moderate LAD diagonal CAD.  Management so far has been adequate without problems. LDL cholesterol most recently was less than 70.  Continue high intensity statin therapy. He will have a 32-monthfollow-up with either Dr. MAngelena Formor Dr. CBurt Knackto service general cardiologist and also to have patient in position for TAVR if it becomes necessary. Blood pressure control is adequate and should continue Toprol-XL, and Micardis HCTZ. Continue Victoza, Janumet, however he is not on SGLT2 therapy which may have some benefit and can be used when GFR is down to 25. Continue with CPAP use. Cleared for upcoming hernia repair. CV condition slightly increases risk, but not prohibitive and risk is manageable.  Okay to hold Plavix for up to a week prior to surgery and resume postsurgery when safe.   Natural history of aortic valve stenosis was discussed in detail.  Cardinal symptoms of angina, syncope, and dyspnea were reviewed and significance relative to prognosis was described.  The importance of sequential imaging for disease monitoring was emphasized.  Work-up including possible heart catheterization and CT angiography were described as essential components of staging for therapy.  Treatment options, TAVR and SAVR, were discussed in some detail with emphasis on TAVR.  Follow-up with Cooper/Mcalhany/Thukkani  Medication Adjustments/Labs and Tests Ordered: Current medicines are reviewed at length  with the patient today.  Concerns regarding medicines are outlined above.  Orders Placed This Encounter  Procedures   EKG 12-Lead   No orders of the defined types were placed in this encounter.   Patient Instructions  Medication Instructions:  Your physician recommends that you continue on your current medications as directed. Please refer to the Current Medication list given to you today.  **Hold your Aspirin and Plavix 7 days prior to upcoming surgery. When instructed to resume medications by Dr. GHarlow Asa resume only the Plavix 7110mdaily.**  *If you need a refill on your cardiac medications before your next appointment, please call your pharmacy*  Lab Work: NONE  Testing/Procedures: NONE  Follow-Up: [facility] are [facility].  As part of our continuing mission to provide you with exceptional heart care, we have created designated Provider Care Teams.  These Care Teams include your primary Cardiologist (physician) and Advanced Practice Providers (APPs -  Physician Assistants and Nurse Practitioners) who all work together to provide you with the care you need, when you need it.  Your next appointment:   6 month(s)  The format for your next appointment:   In Person  Provider:   ChLauree ChandlerMD or MiSherren MochaMD  Important Information About Sugar         Signed, HeSinclair GroomsMD  04/13/2022 5:57 PM  [facility]

## 2022-03-29 ENCOUNTER — Encounter: Payer: Self-pay | Admitting: Interventional Cardiology

## 2022-03-29 ENCOUNTER — Ambulatory Visit: Payer: Medicare Other | Attending: Interventional Cardiology | Admitting: Interventional Cardiology

## 2022-03-29 VITALS — BP 104/62 | HR 51 | Ht 65.0 in | Wt 176.6 lb

## 2022-03-29 DIAGNOSIS — E1122 Type 2 diabetes mellitus with diabetic chronic kidney disease: Secondary | ICD-10-CM | POA: Diagnosis not present

## 2022-03-29 DIAGNOSIS — I25118 Atherosclerotic heart disease of native coronary artery with other forms of angina pectoris: Secondary | ICD-10-CM | POA: Insufficient documentation

## 2022-03-29 DIAGNOSIS — Z01818 Encounter for other preprocedural examination: Secondary | ICD-10-CM | POA: Insufficient documentation

## 2022-03-29 DIAGNOSIS — G4733 Obstructive sleep apnea (adult) (pediatric): Secondary | ICD-10-CM | POA: Insufficient documentation

## 2022-03-29 DIAGNOSIS — I350 Nonrheumatic aortic (valve) stenosis: Secondary | ICD-10-CM | POA: Insufficient documentation

## 2022-03-29 DIAGNOSIS — E785 Hyperlipidemia, unspecified: Secondary | ICD-10-CM | POA: Diagnosis not present

## 2022-03-29 DIAGNOSIS — I10 Essential (primary) hypertension: Secondary | ICD-10-CM | POA: Diagnosis not present

## 2022-03-29 DIAGNOSIS — I2510 Atherosclerotic heart disease of native coronary artery without angina pectoris: Secondary | ICD-10-CM

## 2022-03-29 DIAGNOSIS — N1831 Chronic kidney disease, stage 3a: Secondary | ICD-10-CM | POA: Diagnosis not present

## 2022-03-29 NOTE — Patient Instructions (Signed)
Medication Instructions:  Your physician recommends that you continue on your current medications as directed. Please refer to the Current Medication list given to you today.  **Hold your Aspirin and Plavix 7 days prior to upcoming surgery. When instructed to resume medications by Dr. Harlow Asa, resume only the Plavix '75mg'$  daily.**  *If you need a refill on your cardiac medications before your next appointment, please call your pharmacy*  Lab Work: NONE  Testing/Procedures: NONE  Follow-Up: At [facility], you and your health needs are our priority.  As part of our continuing mission to provide you with exceptional heart care, we have created designated Provider Care Teams.  These Care Teams include your primary Cardiologist (physician) and Advanced Practice Providers (APPs -  Physician Assistants and Nurse Practitioners) who all work together to provide you with the care you need, when you need it.  Your next appointment:   6 month(s)  The format for your next appointment:   In Person  Provider:   Lauree Chandler, MD or Sherren Mocha, MD  Important Information About Sugar

## 2022-04-07 ENCOUNTER — Encounter (HOSPITAL_BASED_OUTPATIENT_CLINIC_OR_DEPARTMENT_OTHER): Payer: Self-pay | Admitting: Surgery

## 2022-04-11 ENCOUNTER — Encounter (HOSPITAL_BASED_OUTPATIENT_CLINIC_OR_DEPARTMENT_OTHER): Payer: Self-pay | Admitting: Surgery

## 2022-04-11 DIAGNOSIS — K429 Umbilical hernia without obstruction or gangrene: Secondary | ICD-10-CM | POA: Diagnosis present

## 2022-04-11 NOTE — H&P (Signed)
PROVIDER: Natnael Biederman Charlotta Newton, MD   Chief Complaint: New Consultation (Umbilical hernia)  History of Present Illness:  Patient is self-referred. 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 Patient has noted a longstanding small umbilical hernia. Patient was recently closing his office and doing a moderate amount of lifting during his move. He developed an increasing bulge at the umbilicus and a separate smaller bulge to the left and superior to the umbilical hernia consistent with a small ventral hernia. This has decreased in size with diminished physical activity. Patient notes some mild constipation but no change in bowel habits related to the hernia. Patient has had no prior hernia repairs. He presents today to discuss umbilical hernia repair in the near future.  Review of Systems: A complete review of systems was obtained from the patient. I have reviewed this information and discussed as appropriate with the patient. See HPI as well for other ROS.  Review of Systems  Constitutional: Negative.  HENT: Negative.  Eyes: Negative.  Respiratory: Negative.  Cardiovascular: Positive for palpitations.  Gastrointestinal: Positive for constipation.  Genitourinary: Negative.  Musculoskeletal: Negative.  Skin: Negative.  Neurological: Negative.  Endo/Heme/Allergies: Negative.  Psychiatric/Behavioral: Negative.    Medical History: Past Medical History:  Diagnosis Date  Arrhythmia  Arthritis  Chronic kidney disease  GERD (gastroesophageal reflux disease)  Hyperlipidemia  Hypertension  Prostate cancer (CMS-HCC)  Seizures (CMS-HCC)  Sleep apnea  Thyroid disease  Type 2 diabetes mellitus (CMS-HCC)   Patient Active Problem List  Diagnosis  Umbilical hernia without obstruction or gangrene   Past Surgical History:  Procedure Laterality Date  Facial fracture surgery 1965  LEFT KNEE  ARTHROSCOPY WITH DEBRIDEMENT/SHAVING 09/26/2013  REPAIR QUADRICEP TENDON (Left: Knee) 09/26/2013  XI ROBOTIC ASSISTED LAPAROSCOPIC RADICAL PROSTATECTOMY LEVEL 2 LYMPHADENECTOMY, PELVIC (Bilateral) 05/05/2020  LEFT HEART CATH AND CORONARY ANGIOGRAPHY 06/09/2021  TONSILLECTOMY    Allergies  Allergen Reactions  Lisinopril Anaphylaxis and Other (See Comments)  hypertension  NDC Code:00143126501 NDC Code:00143126501  Apparent paradoxical increase in BP  Nsaids (Non-Steroidal Anti-Inflammatory Drug) Other (See Comments)  Kidney damage  Ranitidine In 0.45 % Sodium Cl Other (See Comments)  Amlodipine Other (See Comments)  Atenolol Other (See Comments)  Heart rate 50 (bradycardia)  Celecoxib Other (See Comments)  Renal Insufficiency   Current Outpatient Medications on File Prior to Visit  Medication Sig Dispense Refill  allopurinoL (ZYLOPRIM) 300 MG tablet Take 1 tablet by mouth once daily  aspirin 81 MG EC tablet Take by mouth  atorvastatin (LIPITOR) 80 MG tablet Take 1 tablet by mouth once daily  clopidogreL (PLAVIX) 75 mg tablet Take 1 tablet by mouth once daily  ezetimibe (ZETIA) 10 mg tablet Take 1 tablet by mouth once daily  metoprolol succinate (TOPROL-XL) 25 MG XL tablet Take by mouth  RABEprazole (ACIPHEX) 20 mg EC tablet Take 1 tablet by mouth 2 (two) times daily  SYNTHROID 137 mcg tablet Take 1 tablet by mouth once daily  telmisartan-hydroCHLOROthiazide (MICARDIS HCT) 40-12.5 mg tablet Take by mouth   No current facility-administered medications on file prior to visit.   Family History  Problem Relation Age of Onset  Coronary Artery Disease (Blocked arteries around heart) Mother  Breast cancer Mother  Coronary Artery Disease (Blocked arteries around heart) Father  Breast cancer Sister    Social History   Tobacco Use  Smoking Status Former  Types: Cigarettes  Quit date: 09/21/1972  Years since quitting:  49.5  Smokeless Tobacco Never    Social History    Socioeconomic History  Marital status: Married  Tobacco Use  Smoking status: Former  Types: Cigarettes  Quit date: 09/21/1972  Years since quitting: 49.5  Smokeless tobacco: Never  Vaping Use  Vaping Use: Never used  Substance and Sexual Activity  Alcohol use: Defer  Drug use: Never   Objective:   Vitals:  BP: 130/82  Pulse: (!) 49  SpO2: 96%  Weight: 79.3 kg (174 lb 12.8 oz)  Height: 163.8 cm (5' 4.5")   Body mass index is 29.54 kg/m.  Physical Exam   GENERAL APPEARANCE Comfortable, no acute issues Development: normal Gross deformities: none  SKIN Rash, lesions, ulcers: none Induration, erythema: none Nodules: none palpable  EYES Conjunctiva and lids: normal Pupils: equal and reactive  EARS, NOSE, MOUTH, THROAT External ears: no lesion or deformity External nose: no lesion or deformity Hearing: grossly normal  NECK Symmetric: yes Trachea: midline Thyroid: no palpable nodules in the thyroid bed  CHEST Respiratory effort: normal Retraction or accessory muscle use: no Breath sounds: normal bilaterally Rales, rhonchi, wheeze: none  CARDIOVASCULAR Auscultation: regular rhythm, normal rate; occasional ectopic beat Murmurs: Grade 2 systolic murmur upper left sternal border Pulses: radial pulse 2+ palpable Lower extremity edema: Trace, bilateral ankles  ABDOMEN At the umbilicus there is a protrusion consistent with umbilical hernia. Palpation reveals approximately a 1.5 cm x 2 cm fascial defect. This is easily reducible. It is nontender. There is a palpable soft tissue mass approximately 1 cm cephalad and to the left of the umbilical defect which may represent a second small herniation.  GENITOURINARY/RECTAL Not assessed  MUSCULOSKELETAL Station and gait: normal Digits and nails: no clubbing or cyanosis Muscle strength: grossly normal all extremities Range of motion: grossly normal all extremities Deformity: none  LYMPHATIC Cervical: none  palpable Supraclavicular: none palpable  PSYCHIATRIC Oriented to person, place, and time: yes Mood and affect: normal for situation Judgment and insight: appropriate for situation   Assessment and Plan:   Umbilical hernia without obstruction or gangrene  Patient presents for evaluation of an enlarging umbilical hernia. Patient is provided with written literature on hernia surgery to review at home.  Today we discussed operative repair of his umbilical hernia. We discussed the size and location of the surgical incision. We discussed the use of prosthetic mesh. We discussed restrictions on his activities following the procedure.  Patient does have a significant cardiac history. He has a murmur on examination. We will request cardiac clearance from his cardiologist, Dr. Pernell Dupre, prior to the surgical procedure.  We will plan outpatient surgery at the [facility] surgery center sometime after Thanksgiving and a time convenient for the patient.   Armandina Gemma, MD [facility] [place] [facility] Office: [phone]

## 2022-04-12 ENCOUNTER — Encounter (HOSPITAL_BASED_OUTPATIENT_CLINIC_OR_DEPARTMENT_OTHER): Payer: Self-pay | Admitting: Surgery

## 2022-04-12 NOTE — Progress Notes (Addendum)
Addendum:   Chart reviewed by anesthesia, Dr Tobias Alexander MDA,  ok to proceed.   Spoke w/ via phone for pre-op interview--- pt Lab needs dos----  [employer] results------ current EKG in epic/ chart COVID test -----patient states asymptomatic no test needed Arrive at ------- 0730 on 04-15-2022 NPO after MN NO Solid Food.  Clear liquids from MN until--- 0630 Med rec completed Medications to take morning of surgery -----  synthroid, toprol, zetia Diabetic medication ----- do not take janumet and do not do victoza morning of surgery Patient instructed no nail polish to be worn day of surgery Patient instructed to bring photo id and insurance card day of surgery Patient aware to have Driver (ride ) / caregiver  for 24 hours after surgery -- wife, beverly Patient Special Instructions ----- n/a Pre-Op special Istructions -----  pt has office note cardiac clearance by Dr Linard Millers on 03-29-2022 in epic/ chart Patient verbalized understanding of instructions that were given at this phone interview. Patient denies shortness of breath, chest pain, fever, cough at this phone interview.   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 Pt stated has not taken a nitro since given prescription.   PCP:  Dr Renold Genta  [facility] 09-25-2021) Cardiologist : Dr Linard Millers  (lov 03-29-2022) Endocrinologist:  Dr Renne Crigler [facility] 12-11-2021) Nephrologist:  Dr Marval Regal  [facility] 12-24-2021 w/ chart) Chest x-ray :  CT 12-24-2021 EKG :  03-29-2022 Echo :  12-22-2021 Stress test: no Cardiac Cath :  06-09-2021 Activity level:  denies sob w/ any activity Sleep Study/ CPAP :  Yes/ Yes Fasting Blood Sugar : / Checks Blood Sugar -- times a day:  pt does not check Blood Thinner/ Instructions /Last Dose:   Plabix ASA / Instructions/ Last Dose :  ASA '81mg'$  Per pt was given instructions by cardiologist to stop both 7 days prior to surgery.  Pt stated last ASA 04-02-2022 and last dose plavix 04-09-2022

## 2022-04-15 ENCOUNTER — Other Ambulatory Visit: Payer: Self-pay

## 2022-04-15 ENCOUNTER — Ambulatory Visit (HOSPITAL_BASED_OUTPATIENT_CLINIC_OR_DEPARTMENT_OTHER): Payer: Medicare Other | Admitting: Anesthesiology

## 2022-04-15 ENCOUNTER — Encounter (HOSPITAL_BASED_OUTPATIENT_CLINIC_OR_DEPARTMENT_OTHER): Payer: Self-pay | Admitting: Surgery

## 2022-04-15 ENCOUNTER — Encounter (HOSPITAL_BASED_OUTPATIENT_CLINIC_OR_DEPARTMENT_OTHER)
Admission: RE | Disposition: A | Discharge status: Home / Self Care | Payer: Self-pay | Source: Home / Self Care | Attending: Surgery

## 2022-04-15 ENCOUNTER — Ambulatory Visit (HOSPITAL_BASED_OUTPATIENT_CLINIC_OR_DEPARTMENT_OTHER)
Admission: RE | Admit: 2022-04-15 | Discharge: 2022-04-15 | Disposition: A | Discharge status: Home / Self Care | Payer: Medicare Other | Attending: Surgery | Admitting: Surgery

## 2022-04-15 DIAGNOSIS — K429 Umbilical hernia without obstruction or gangrene: Secondary | ICD-10-CM | POA: Diagnosis not present

## 2022-04-15 DIAGNOSIS — E039 Hypothyroidism, unspecified: Secondary | ICD-10-CM | POA: Insufficient documentation

## 2022-04-15 DIAGNOSIS — K5900 Constipation, unspecified: Secondary | ICD-10-CM | POA: Diagnosis not present

## 2022-04-15 DIAGNOSIS — K420 Umbilical hernia with obstruction, without gangrene: Secondary | ICD-10-CM | POA: Insufficient documentation

## 2022-04-15 DIAGNOSIS — E119 Type 2 diabetes mellitus without complications: Secondary | ICD-10-CM | POA: Insufficient documentation

## 2022-04-15 DIAGNOSIS — Z87891 Personal history of nicotine dependence: Secondary | ICD-10-CM

## 2022-04-15 DIAGNOSIS — D638 Anemia in other chronic diseases classified elsewhere: Secondary | ICD-10-CM

## 2022-04-15 DIAGNOSIS — Z7902 Long term (current) use of antithrombotics/antiplatelets: Secondary | ICD-10-CM | POA: Insufficient documentation

## 2022-04-15 DIAGNOSIS — I10 Essential (primary) hypertension: Secondary | ICD-10-CM | POA: Diagnosis not present

## 2022-04-15 DIAGNOSIS — I129 Hypertensive chronic kidney disease with stage 1 through stage 4 chronic kidney disease, or unspecified chronic kidney disease: Secondary | ICD-10-CM | POA: Insufficient documentation

## 2022-04-15 DIAGNOSIS — D649 Anemia, unspecified: Secondary | ICD-10-CM | POA: Diagnosis not present

## 2022-04-15 DIAGNOSIS — M1990 Unspecified osteoarthritis, unspecified site: Secondary | ICD-10-CM | POA: Insufficient documentation

## 2022-04-15 DIAGNOSIS — I2510 Atherosclerotic heart disease of native coronary artery without angina pectoris: Secondary | ICD-10-CM | POA: Diagnosis not present

## 2022-04-15 DIAGNOSIS — G4730 Sleep apnea, unspecified: Secondary | ICD-10-CM | POA: Diagnosis not present

## 2022-04-15 DIAGNOSIS — Z7984 Long term (current) use of oral hypoglycemic drugs: Secondary | ICD-10-CM | POA: Insufficient documentation

## 2022-04-15 DIAGNOSIS — E1122 Type 2 diabetes mellitus with diabetic chronic kidney disease: Secondary | ICD-10-CM | POA: Diagnosis not present

## 2022-04-15 DIAGNOSIS — N189 Chronic kidney disease, unspecified: Secondary | ICD-10-CM | POA: Diagnosis not present

## 2022-04-15 DIAGNOSIS — Z01818 Encounter for other preprocedural examination: Secondary | ICD-10-CM

## 2022-04-15 DIAGNOSIS — K219 Gastro-esophageal reflux disease without esophagitis: Secondary | ICD-10-CM | POA: Insufficient documentation

## 2022-04-15 HISTORY — DX: Nonrheumatic aortic (valve) stenosis: I35.0

## 2022-04-15 HISTORY — DX: Autoimmune thyroiditis: E06.3

## 2022-04-15 HISTORY — DX: Other specified hypothyroidism: E03.8

## 2022-04-15 HISTORY — DX: Anemia in chronic kidney disease: D63.1

## 2022-04-15 HISTORY — DX: Type 2 diabetes mellitus without complications: E11.9

## 2022-04-15 HISTORY — PX: UMBILICAL HERNIA REPAIR: SHX196

## 2022-04-15 HISTORY — DX: Hyperlipidemia, unspecified: E78.5

## 2022-04-15 HISTORY — DX: Umbilical hernia without obstruction or gangrene: K42.9

## 2022-04-15 HISTORY — DX: Personal history of other diseases of the respiratory system: Z87.09

## 2022-04-15 HISTORY — DX: Chronic gout, unspecified, without tophus (tophi): M1A.9XX0

## 2022-04-15 HISTORY — DX: Long term (current) use of anticoagulants: Z79.01

## 2022-04-15 HISTORY — DX: Chronic kidney disease, stage 3 unspecified: N18.30

## 2022-04-15 HISTORY — DX: Atrial premature depolarization: I49.1

## 2022-04-15 HISTORY — DX: Other specified postprocedural states: Z85.828

## 2022-04-15 HISTORY — DX: Testicular hypofunction: E29.1

## 2022-04-15 HISTORY — DX: Secondary hyperparathyroidism of renal origin: N25.81

## 2022-04-15 HISTORY — DX: Unspecified osteoarthritis, unspecified site: M19.90

## 2022-04-15 HISTORY — DX: Presence of external hearing-aid: Z97.4

## 2022-04-15 LAB — POCT I-STAT, CHEM 8
BUN: 40 mg/dL — ABNORMAL HIGH (ref 8–23)
Calcium, Ion: 1.28 mmol/L (ref 1.15–1.40)
Chloride: 105 mmol/L (ref 98–111)
Creatinine, Ser: 1.8 mg/dL — ABNORMAL HIGH (ref 0.61–1.24)
Glucose, Bld: 99 mg/dL (ref 70–99)
HCT: 47 % (ref 39.0–52.0)
Hemoglobin: 16 g/dL (ref 13.0–17.0)
Potassium: 4.1 mmol/L (ref 3.5–5.1)
Sodium: 143 mmol/L (ref 135–145)
TCO2: 27 mmol/L (ref 22–32)

## 2022-04-15 LAB — GLUCOSE, CAPILLARY: Glucose-Capillary: 123 mg/dL — ABNORMAL HIGH (ref 70–99)

## 2022-04-15 SURGERY — REPAIR, HERNIA, UMBILICAL, ADULT
Anesthesia: General | Site: Abdomen

## 2022-04-15 MED ORDER — ROCURONIUM BROMIDE 10 MG/ML (PF) SYRINGE
PREFILLED_SYRINGE | INTRAVENOUS | Status: DC | PRN
  Administered 2022-04-15: 50 mg via INTRAVENOUS

## 2022-04-15 MED ORDER — PHENYLEPHRINE 80 MCG/ML (10ML) SYRINGE FOR IV PUSH (FOR BLOOD PRESSURE SUPPORT)
PREFILLED_SYRINGE | INTRAVENOUS | Status: DC | PRN
  Administered 2022-04-15 (×2): 160 ug via INTRAVENOUS

## 2022-04-15 MED ORDER — PHENYLEPHRINE HCL (PRESSORS) 10 MG/ML IV SOLN
INTRAVENOUS | Status: AC
  Filled 2022-04-15: qty 1

## 2022-04-15 MED ORDER — FENTANYL CITRATE (PF) 100 MCG/2ML IJ SOLN
INTRAMUSCULAR | Status: DC | PRN
  Administered 2022-04-15 (×2): 50 ug via INTRAVENOUS

## 2022-04-15 MED ORDER — 0.9 % SODIUM CHLORIDE (POUR BTL) OPTIME
TOPICAL | Status: DC | PRN
  Administered 2022-04-15: 500 mL

## 2022-04-15 MED ORDER — PROPOFOL 10 MG/ML IV BOLUS
INTRAVENOUS | Status: DC | PRN
  Administered 2022-04-15: 20 mg via INTRAVENOUS
  Administered 2022-04-15: 100 mg via INTRAVENOUS

## 2022-04-15 MED ORDER — PROPOFOL 10 MG/ML IV BOLUS
INTRAVENOUS | Status: AC
  Filled 2022-04-15: qty 20

## 2022-04-15 MED ORDER — CEFAZOLIN SODIUM-DEXTROSE 2-4 GM/100ML-% IV SOLN
2.0000 g | INTRAVENOUS | Status: AC
  Administered 2022-04-15: 2 g via INTRAVENOUS

## 2022-04-15 MED ORDER — FENTANYL CITRATE (PF) 100 MCG/2ML IJ SOLN
INTRAMUSCULAR | Status: AC
  Filled 2022-04-15: qty 2

## 2022-04-15 MED ORDER — SUGAMMADEX SODIUM 200 MG/2ML IV SOLN
INTRAVENOUS | Status: DC | PRN
  Administered 2022-04-15: 200 mg via INTRAVENOUS

## 2022-04-15 MED ORDER — ONDANSETRON HCL 4 MG/2ML IJ SOLN
INTRAMUSCULAR | Status: DC | PRN
  Administered 2022-04-15: 4 mg via INTRAVENOUS

## 2022-04-15 MED ORDER — CEFAZOLIN SODIUM-DEXTROSE 2-4 GM/100ML-% IV SOLN
INTRAVENOUS | Status: AC
  Filled 2022-04-15: qty 100

## 2022-04-15 MED ORDER — DIPHENHYDRAMINE HCL 50 MG/ML IJ SOLN
INTRAMUSCULAR | Status: AC
  Filled 2022-04-15: qty 1

## 2022-04-15 MED ORDER — ACETAMINOPHEN 500 MG PO TABS
ORAL_TABLET | ORAL | Status: AC
  Filled 2022-04-15: qty 2

## 2022-04-15 MED ORDER — TRAMADOL HCL 50 MG PO TABS: 50.0000 mg | ORAL_TABLET | Freq: Four times a day (QID) | ORAL | 0 refills | Status: DC | PRN

## 2022-04-15 MED ORDER — ACETAMINOPHEN 500 MG PO TABS
1000.0000 mg | ORAL_TABLET | Freq: Once | ORAL | Status: AC
  Administered 2022-04-15: 1000 mg via ORAL

## 2022-04-15 MED ORDER — LIDOCAINE 2% (20 MG/ML) 5 ML SYRINGE
INTRAMUSCULAR | Status: DC | PRN
  Administered 2022-04-15: 60 mg via INTRAVENOUS

## 2022-04-15 MED ORDER — FENTANYL CITRATE (PF) 100 MCG/2ML IJ SOLN: 25.0000 ug | INTRAMUSCULAR | Status: DC | PRN

## 2022-04-15 MED ORDER — PHENYLEPHRINE 80 MCG/ML (10ML) SYRINGE FOR IV PUSH (FOR BLOOD PRESSURE SUPPORT)
PREFILLED_SYRINGE | INTRAVENOUS | Status: AC
  Filled 2022-04-15: qty 10

## 2022-04-15 MED ORDER — PHENYLEPHRINE HCL-NACL 20-0.9 MG/250ML-% IV SOLN
INTRAVENOUS | Status: DC | PRN
  Administered 2022-04-15: 80 ug/min via INTRAVENOUS

## 2022-04-15 MED ORDER — SODIUM CHLORIDE 0.9 % IV SOLN: INTRAVENOUS | Status: DC

## 2022-04-15 MED ORDER — CHLORHEXIDINE GLUCONATE CLOTH 2 % EX PADS: 6.0000 | MEDICATED_PAD | Freq: Once | CUTANEOUS | Status: DC

## 2022-04-15 MED ORDER — GLYCOPYRROLATE PF 0.2 MG/ML IJ SOSY
PREFILLED_SYRINGE | INTRAMUSCULAR | Status: DC | PRN
  Administered 2022-04-15: .2 mg via INTRAVENOUS

## 2022-04-15 MED ORDER — ROCURONIUM BROMIDE 10 MG/ML (PF) SYRINGE
PREFILLED_SYRINGE | INTRAVENOUS | Status: AC
  Filled 2022-04-15: qty 10

## 2022-04-15 MED ORDER — LIDOCAINE HCL (PF) 2 % IJ SOLN
INTRAMUSCULAR | Status: AC
  Filled 2022-04-15: qty 5

## 2022-04-15 MED ORDER — BUPIVACAINE LIPOSOME 1.3 % IJ SUSP
INTRAMUSCULAR | Status: DC | PRN
  Administered 2022-04-15: 40 mL via SURGICAL_CAVITY

## 2022-04-15 MED ORDER — EPHEDRINE 5 MG/ML INJ
INTRAVENOUS | Status: AC
  Filled 2022-04-15: qty 5

## 2022-04-15 MED ORDER — DIPHENHYDRAMINE HCL 50 MG/ML IJ SOLN
6.2500 mg | Freq: Once | INTRAMUSCULAR | Status: AC
  Administered 2022-04-15: 6.25 mg via INTRAVENOUS

## 2022-04-15 SURGICAL SUPPLY — 43 items
ADH SKN CLS APL DERMABOND .7 (GAUZE/BANDAGES/DRESSINGS) ×1
APL PRP STRL LF DISP 70% ISPRP (MISCELLANEOUS) ×1
APL SKNCLS STERI-STRIP NONHPOA (GAUZE/BANDAGES/DRESSINGS) ×1
BALL CTTN LRG ABS STRL LF (GAUZE/BANDAGES/DRESSINGS)
BENZOIN TINCTURE PRP APPL 2/3 (GAUZE/BANDAGES/DRESSINGS) ×1 IMPLANT
BLADE CLIPPER SENSICLIP SURGIC (BLADE) IMPLANT
BLADE SURG 15 STRL LF DISP TIS (BLADE) ×1 IMPLANT
BLADE SURG 15 STRL SS (BLADE) ×1
CHLORAPREP W/TINT 26 (MISCELLANEOUS) ×1 IMPLANT
COTTONBALL LRG STERILE PKG (GAUZE/BANDAGES/DRESSINGS) ×1 IMPLANT
COVER BACK TABLE 60X90IN (DRAPES) ×1 IMPLANT
COVER MAYO STAND STRL (DRAPES) ×1 IMPLANT
DERMABOND ADVANCED .7 DNX12 (GAUZE/BANDAGES/DRESSINGS) ×1 IMPLANT
DRAPE LAPAROTOMY 100X72 PEDS (DRAPES) ×1 IMPLANT
DRAPE UTILITY XL STRL (DRAPES) ×1 IMPLANT
ELECT REM PT RETURN 9FT ADLT (ELECTROSURGICAL) ×1
ELECTRODE REM PT RTRN 9FT ADLT (ELECTROSURGICAL) ×1 IMPLANT
GAUZE 4X4 16PLY ~~LOC~~+RFID DBL (SPONGE) ×1 IMPLANT
GAUZE SPONGE 4X4 12PLY STRL (GAUZE/BANDAGES/DRESSINGS) ×1 IMPLANT
GLOVE PROTEXIS LATEX SZ 7.5 (GLOVE) ×1 IMPLANT
GLOVE SURG LATEX 7.5 PF (GLOVE) ×1 IMPLANT
GLOVE SURG ORTHO 8.0 STRL STRW (GLOVE) ×1 IMPLANT
GOWN STRL REUS W/TWL LRG LVL3 (GOWN DISPOSABLE) ×1 IMPLANT
KIT TURNOVER CYSTO (KITS) ×1 IMPLANT
MESH VENTRALEX ST 2.5 CRC MED (Mesh General) IMPLANT
NDL HYPO 25X1 1.5 SAFETY (NEEDLE) ×1 IMPLANT
NEEDLE HYPO 25X1 1.5 SAFETY (NEEDLE) ×1 IMPLANT
NS IRRIG 500ML POUR BTL (IV SOLUTION) ×1 IMPLANT
PACK BASIN DAY SURGERY FS (CUSTOM PROCEDURE TRAY) ×1 IMPLANT
PENCIL SMOKE EVACUATOR (MISCELLANEOUS) ×1 IMPLANT
STRIP CLOSURE SKIN 1/2X4 (GAUZE/BANDAGES/DRESSINGS) ×1 IMPLANT
SUT MNCRL AB 4-0 PS2 18 (SUTURE) ×1 IMPLANT
SUT NOVA 0 T19/GS 22DT (SUTURE) ×1 IMPLANT
SUT NOVA NAB DX-16 0-1 5-0 T12 (SUTURE) IMPLANT
SUT VIC AB 2-0 SH 27 (SUTURE) ×1
SUT VIC AB 2-0 SH 27XBRD (SUTURE) IMPLANT
SUT VIC AB 3-0 SH 8-18 (SUTURE) ×1 IMPLANT
SUT VIC AB 4-0 PS2 27 (SUTURE) IMPLANT
SYR CONTROL 10ML LL (SYRINGE) ×1 IMPLANT
TOWEL OR 17X24 6PK STRL BLUE (TOWEL DISPOSABLE) IMPLANT
TOWEL OR 17X26 10 PK STRL BLUE (TOWEL DISPOSABLE) ×2 IMPLANT
TUBE CONNECTING 12X1/4 (SUCTIONS) IMPLANT
WATER STERILE IRR 500ML POUR (IV SOLUTION) ×1 IMPLANT

## 2022-04-15 NOTE — Discharge Instructions (Addendum)
[facility]  HERNIA REPAIR POST OP INSTRUCTIONS  Always review your discharge instruction sheet given to you by the facility where your surgery was performed.  A  prescription for pain medication may be sent to your pharmacy on discharge.  Take your pain medication as prescribed.  If narcotic pain medicine is not needed, then you may take acetaminophen (Tylenol) or ibuprofen (Advil) as needed.  Take your usually prescribed medications unless otherwise directed.  If you need a refill on your pain medication, please contact your pharmacy.  They will contact our office to request authorization. Prescriptions will not be filled after 5:00 PM daily or on weekends.  You should follow a light diet the first 24 hours after arrival home, such as soup and crackers or toast.  Be sure to include plenty of fluids daily.  Resume your normal diet the day after surgery.  Most patients will experience some swelling and bruising around the surgical site.  Ice packs and reclining will help.  Swelling and bruising can take several days to resolve.   It is common to experience some constipation if taking pain medication after surgery.  Increasing fluid intake and taking a stool softener (such as Colace) will usually help or prevent this problem from occurring.  A mild laxative (Milk of Magnesia or Miralax) should be taken according to package directions if there is no bowel movement after 48 hours.  You will likely have Dermabond (topical glue) over your incisions.  This seals the incisions and allows you to bathe and shower at any time after your surgery.  Glue should remain in place for up to 10 days.  It may be removed after 10 days by pealing off the Dermabond material or using Vaseline or naval jelly to remove.  ACTIVITIES:  You may resume regular (light) daily activities beginning the next day - such as daily self-care, walking, climbing stairs - gradually increasing activities as tolerated.  You  may have sexual intercourse when it is comfortable.  Refrain from any heavy lifting or straining until approved by your doctor.  You may drive when you are no longer taking prescription pain medication, when you can comfortably wear a seatbelt, and when you can safely maneuver your car and apply the brakes.  You should see your doctor in the office for a follow-up appointment approximately 2-3 weeks after your surgery.  Make sure that you call for this appointment within a day or two after you arrive home to insure a convenient appointment time.   NO TYLENOL/ACETAMINOPHEN PRODUCTS UNTIL 2:38PM 04/15/22  WHEN TO CALL YOUR DOCTOR: Fever greater than 101.0 Inability to urinate Persistent nausea and/or vomiting Extreme swelling or bruising Continued bleeding from incision Increased pain, redness, or drainage from the incision  The clinic staff is available to answer your questions during regular business hours.  Please don't hesitate to call and ask to speak to one of the nurses for clinical concerns.  If you have a medical emergency, go to the nearest emergency room or call 911.  A surgeon from [facility] is always on call for the hospital.   [facility] [address], [place], [place], [place]  [address]                                           [phone] ? [phone] ? FAX [phone]  Post Anesthesia Home Care Instructions  Activity: Get plenty of rest for the remainder of the day. A responsible individual must stay with you for 24 hours following the procedure.  For the next 24 hours, DO NOT: -Drive a car -Paediatric nurse -Drink alcoholic beverages -Take any medication unless instructed by your physician -Make any legal decisions or sign important papers.  Meals: Start with liquid foods such as gelatin or soup. Progress to regular foods as tolerated. Avoid greasy, spicy, heavy foods. If nausea and/or vomiting  occur, drink only clear liquids until the nausea and/or vomiting subsides. Call your physician if vomiting continues.  Special Instructions/Symptoms: Your throat may feel dry or sore from the anesthesia or the breathing tube placed in your throat during surgery. If this causes discomfort, gargle with warm salt water. The discomfort should disappear within 24 hours.  If you had a scopolamine patch placed behind your ear for the management of post- operative nausea and/or vomiting:  1. The medication in the patch is effective for 72 hours, after which it should be removed.  Wrap patch in a tissue and discard in the trash. Wash hands thoroughly with soap and water. 2. You may remove the patch earlier than 72 hours if you experience unpleasant side effects which may include dry mouth, dizziness or visual disturbances. 3. Avoid touching the patch. Wash your hands with soap and water after contact with the patch.        Information for Discharge Teaching: EXPAREL (bupivacaine liposome injectable suspension)   Your surgeon or anesthesiologist gave you EXPAREL(bupivacaine) to help control your pain after surgery.  EXPAREL is a local anesthetic that provides pain relief by numbing the tissue around the surgical site. EXPAREL is designed to release pain medication over time and can control pain for up to 72 hours. Depending on how you respond to EXPAREL, you may require less pain medication during your recovery.  Possible side effects: Temporary loss of sensation or ability to move in the area where bupivacaine was injected. Nausea, vomiting, constipation Rarely, numbness and tingling in your mouth or lips, lightheadedness, or anxiety may occur. Call your doctor right away if you think you may be experiencing any of these sensations, or if you have other questions regarding possible side effects.  Follow all other discharge instructions given to you by your surgeon or nurse. Eat a healthy diet and  drink plenty of water or other fluids.  If you return to the hospital for any reason within 96 hours following the administration of EXPAREL, it is important for health care providers to know that you have received this anesthetic. A teal colored band has been placed on your arm with the date, time and amount of EXPAREL you have received in order to alert and inform your health care providers. Please leave this armband in place for the full 96 hours following administration, and then you may remove the band.

## 2022-04-15 NOTE — Anesthesia Postprocedure Evaluation (Signed)
Anesthesia Post Note  Patient: Peter Hock, MD  Procedure(s) Performed: OPEN UMBILICAL HERNIA REPAIR WITH MESH PATCH (Abdomen)     Patient location during evaluation: PACU Anesthesia Type: General Level of consciousness: awake and alert Pain management: pain level controlled Vital Signs Assessment: post-procedure vital signs reviewed and stable Respiratory status: spontaneous breathing, nonlabored ventilation and respiratory function stable Cardiovascular status: blood pressure returned to baseline and stable Postop Assessment: no apparent nausea or vomiting Anesthetic complications: no  No notable events documented.  Last Vitals:  Vitals:   04/15/22 1145 04/15/22 1240  BP: 134/76   Pulse: (!) 42 64  Resp: 18 16  Temp: 36.4 C 36.4 C  SpO2: 94% 99%    Last Pain:  Vitals:   04/15/22 1145  TempSrc:   PainSc: 3                  Nyshawn Gowdy,W. EDMOND

## 2022-04-15 NOTE — Anesthesia Procedure Notes (Signed)
Procedure Name: Intubation Date/Time: 04/15/2022 10:14 AM  Performed by: Mechele Claude, CRNAPre-anesthesia Checklist: Patient identified, Emergency Drugs available, Suction available and Patient being monitored Patient Re-evaluated:Patient Re-evaluated prior to induction Oxygen Delivery Method: Circle system utilized Preoxygenation: Pre-oxygenation with 100% oxygen Induction Type: IV induction Ventilation: Mask ventilation without difficulty Laryngoscope Size: Mac and 4 Grade View: Grade I Tube type: Oral Tube size: 7.5 mm Number of attempts: 1 Airway Equipment and Method: Stylet and Oral airway Placement Confirmation: ETT inserted through vocal cords under direct vision, positive ETCO2 and breath sounds checked- equal and bilateral Secured at: 23 cm Tube secured with: Tape Dental Injury: Teeth and Oropharynx as per pre-operative assessment

## 2022-04-15 NOTE — Op Note (Signed)
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 Patient has noted a longstanding small umbilical hernia. Patient was recently closing his office and doing a moderate amount of lifting during his move. He developed an increasing bulge at the umbilicus and a separate smaller bulge to the left and superior to the umbilical hernia consistent with a small ventral hernia. This has decreased in size with diminished physical activity. Patient notes some mild constipation but no change in bowel habits related to the hernia. Patient has had no prior hernia repairs. He presents today to discuss umbilical hernia repair in the near future.   Procedure:  The patient was seen in the pre-op holding area. The risks, benefits, complications, treatment options, and expected outcomes were previously discussed with the patient. The patient agreed with the proposed plan and has signed the informed consent form.  The patient was brought to the operating room by the surgical team, identified as Sherrlyn Hock, MD and the procedure verified. A "time out" was completed and the above information confirmed.  Following administration of general endotracheal anesthesia, the patient was positioned and then prepped and draped in the usual aseptic fashion.  After ascertaining that an adequate level of anesthesia been achieved, a transverse incision was made just below the umbilicus with a #56 blade.  Dissection was carried through subcutaneous tissues.  Fascial  plane is developed around the umbilical defect.  Hernia sac is opened and the umbilical skin is elevated off of the hernia sac.  The skin is entirely elevated off of the underlying defect.  Hernia sac is then excised.  It contains incarcerated omentum.  On examination, there is a second fascial defect in the midline just above the level of the umbilical defect.  There is a small bridge of fascia which is divided.  The underlying preperitoneal adipose tissue and omentum are freed from the edges of the hernia defect completely.  The defect measures a maximum of 4 cm in diameter from cephalad to caudad.  After developing the plane beneath the fascia circumferentially, a Bard Ventralex 6.4 cm mesh patch is selected, prepared, and inserted into the preperitoneal space.  It is deployed circumferentially.  The midline wound is then closed with interrupted 0 Novafil and #1 Novafil sutures incorporating the underlying mesh patch into the closure.  Fascia comes together nicely with no undue tension.  Good hemostasis is noted.  Local anesthetic with a mixture of Marcaine and Exparel is then infiltrated circumferentially into the fascial plane.  Subcutaneous tissues are then closed with interrupted 3-0 Vicryl sutures.  Umbilicus is reaffixed to the abdominal wall with an interrupted 3-0 Vicryl suture.  Skin is anesthetized with a mixture of Marcaine and Exparel circumferentially.  Skin edges are then closed with a running 4-0 Monocryl subcuticular suture.  Wound is washed and dried and Dermabond is applied as dressing.  Patient is awakened from anesthesia and transported to the recovery room in stable condition.  The patient tolerated the procedure well.  Armandina Gemma, [facility] Office: [phone]

## 2022-04-15 NOTE — Anesthesia Preprocedure Evaluation (Addendum)
Anesthesia Evaluation  Patient identified by MRN, date of birth, ID band Patient awake    Reviewed: Allergy & Precautions, H&P , NPO status , Patient's Chart, lab work & pertinent test results  Airway Mallampati: III  TM Distance: >3 FB Neck ROM: Full    Dental no notable dental hx. (+) Teeth Intact, Dental Advisory Given   Pulmonary sleep apnea , former smoker   Pulmonary exam normal breath sounds clear to auscultation       Cardiovascular hypertension, Pt. on medications + CAD  + Valvular Problems/Murmurs AS  Rhythm:Regular Rate:Normal + Systolic murmurs    Neuro/Psych negative neurological ROS  negative psych ROS   GI/Hepatic Neg liver ROS,GERD  ,,  Endo/Other  diabetes, Type 2, Oral Hypoglycemic AgentsHypothyroidism    Renal/GU Renal InsufficiencyRenal disease  negative genitourinary   Musculoskeletal  (+) Arthritis , Osteoarthritis,    Abdominal   Peds  Hematology  (+) Blood dyscrasia, anemia   Anesthesia Other Findings   Reproductive/Obstetrics negative OB ROS                             Anesthesia Physical Anesthesia Plan  ASA: 3  Anesthesia Plan: General   Post-op Pain Management: Tylenol PO (pre-op)*   Induction: Intravenous  PONV Risk Score and Plan: 3 and Ondansetron, Dexamethasone and Treatment may vary due to age or medical condition  Airway Management Planned: Oral ETT  Additional Equipment:   Intra-op Plan:   Post-operative Plan: Extubation in OR  Informed Consent: I have reviewed the patients History and Physical, chart, labs and discussed the procedure including the risks, benefits and alternatives for the proposed anesthesia with the patient or authorized representative who has indicated his/her understanding and acceptance.     Dental advisory given  Plan Discussed with: CRNA  Anesthesia Plan Comments:        Anesthesia Quick Evaluation

## 2022-04-15 NOTE — Progress Notes (Signed)
3 falls due to tinnitus and vertigo

## 2022-04-15 NOTE — Interval H&P Note (Signed)
History and Physical Interval Note:  04/15/2022 9:42 AM  Sherrlyn Hock, MD  has presented today for surgery, with the diagnosis of UMBILICAL HERNIA.  The various methods of treatment have been discussed with the patient and family. After consideration of risks, benefits and other options for treatment, the patient has consented to    Procedure(s) with comments: [place] (N/A) - 60 MINUTES LMA as a surgical intervention.    The patient's history has been reviewed, patient examined, no change in status, stable for surgery.  I have reviewed the patient's chart and labs.  Questions were answered to the patient's satisfaction.    Armandina Gemma, [facility] [place] practice Office: [place]

## 2022-04-15 NOTE — Transfer of Care (Signed)
Immediate Anesthesia Transfer of Care Note  Patient: Peter Hock, MD  Procedure(s) Performed: Procedure(s) (LRB): OPEN UMBILICAL HERNIA REPAIR WITH MESH PATCH (N/A)  Patient Location: PACU  Anesthesia Type: General  Level of Consciousness: awake, oriented, sedated and patient cooperative  Airway & Oxygen Therapy: Patient Spontanous Breathing and Patient connected to face mask oxygen  Post-op Assessment: Report given to PACU RN and Post -op Vital signs reviewed and stable  Post vital signs: Reviewed and stable  Complications: No apparent anesthesia complications Last Vitals:  Vitals Value Taken Time  BP 148/81 04/15/22 1118  Temp    Pulse 36 04/15/22 1120  Resp 16 04/15/22 1120  SpO2 92 % 04/15/22 1120  Vitals shown include unvalidated device data.  Last Pain:  Vitals:   04/15/22 0807  TempSrc: Oral  PainSc: 2       Patients Stated Pain Goal: 5 (88/41/66 0630)  Complications: No notable events documented.

## 2022-04-16 ENCOUNTER — Encounter (HOSPITAL_BASED_OUTPATIENT_CLINIC_OR_DEPARTMENT_OTHER): Payer: Self-pay | Admitting: Surgery

## 2022-04-28 DIAGNOSIS — K429 Umbilical hernia without obstruction or gangrene: Secondary | ICD-10-CM | POA: Diagnosis not present

## 2022-04-30 DIAGNOSIS — N411 Chronic prostatitis: Secondary | ICD-10-CM | POA: Diagnosis not present

## 2022-04-30 DIAGNOSIS — E038 Other specified hypothyroidism: Secondary | ICD-10-CM | POA: Diagnosis not present

## 2022-04-30 DIAGNOSIS — E063 Autoimmune thyroiditis: Secondary | ICD-10-CM | POA: Diagnosis not present

## 2022-04-30 DIAGNOSIS — E7800 Pure hypercholesterolemia, unspecified: Secondary | ICD-10-CM | POA: Diagnosis not present

## 2022-04-30 DIAGNOSIS — N2581 Secondary hyperparathyroidism of renal origin: Secondary | ICD-10-CM | POA: Diagnosis not present

## 2022-04-30 DIAGNOSIS — E1122 Type 2 diabetes mellitus with diabetic chronic kidney disease: Secondary | ICD-10-CM | POA: Diagnosis not present

## 2022-04-30 DIAGNOSIS — C61 Malignant neoplasm of prostate: Secondary | ICD-10-CM | POA: Diagnosis not present

## 2022-04-30 DIAGNOSIS — E291 Testicular hypofunction: Secondary | ICD-10-CM | POA: Diagnosis not present

## 2022-04-30 DIAGNOSIS — N183 Chronic kidney disease, stage 3 unspecified: Secondary | ICD-10-CM | POA: Diagnosis not present

## 2022-04-30 DIAGNOSIS — I2510 Atherosclerotic heart disease of native coronary artery without angina pectoris: Secondary | ICD-10-CM | POA: Diagnosis not present

## 2022-04-30 DIAGNOSIS — Z6827 Body mass index (BMI) 27.0-27.9, adult: Secondary | ICD-10-CM | POA: Diagnosis not present

## 2022-05-13 ENCOUNTER — Encounter: Payer: Self-pay | Admitting: Internal Medicine

## 2022-05-28 ENCOUNTER — Other Ambulatory Visit: Payer: Self-pay | Admitting: Internal Medicine

## 2022-05-31 ENCOUNTER — Other Ambulatory Visit: Payer: Self-pay | Admitting: Interventional Cardiology

## 2022-06-17 ENCOUNTER — Other Ambulatory Visit: Payer: Self-pay

## 2022-06-17 MED ORDER — HYDROXYZINE HCL 25 MG PO TABS: 25.0000 mg | ORAL_TABLET | Freq: Every day | ORAL | 3 refills | Status: DC

## 2022-07-28 ENCOUNTER — Other Ambulatory Visit: Payer: Self-pay

## 2022-08-11 ENCOUNTER — Other Ambulatory Visit: Payer: Self-pay | Admitting: *Deleted

## 2022-08-11 MED ORDER — METOPROLOL SUCCINATE ER 25 MG PO TB24: 12.5000 mg | ORAL_TABLET | Freq: Every day | ORAL | 2 refills | Status: DC

## 2022-08-28 DIAGNOSIS — R040 Epistaxis: Secondary | ICD-10-CM | POA: Diagnosis not present

## 2022-08-28 DIAGNOSIS — Z7902 Long term (current) use of antithrombotics/antiplatelets: Secondary | ICD-10-CM | POA: Diagnosis not present

## 2022-09-03 ENCOUNTER — Telehealth: Payer: Self-pay

## 2022-09-03 NOTE — Transitions of Care (Post Inpatient/ED Visit) (Signed)
   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 DOB: [birth year]  Today's TOC FU Call Status: Today's TOC FU Call Status:: Successful TOC FU Call Competed TOC FU Call Complete Date: 09/03/22  Transition Care Management Follow-up Telephone Call Date of Discharge: 08/29/22 Discharge Facility: Other ([facility]) Name of Other ([facility]) [facility]charge Facility: [facility] ED Type of Discharge: Emergency Department Reason for ED Visit: Other: How have you been since you were released from the hospital?: Better Any questions or concerns?: No  Items Reviewed: Did you receive and understand the discharge instructions provided?: Yes Medications obtained and verified?:  (N/A) Any new allergies since your discharge?: No Dietary orders reviewed?: NA Do you have support at home?: Yes People in Home: spouse  Home Care and Equipment/Supplies: Were Home Health Services Ordered?: NA Any new equipment or medical supplies ordered?: NA  Functional Questionnaire: Do you need assistance with bathing/showering or dressing?: No Do you need assistance with meal preparation?: No Do you need assistance with eating?: No Do you have difficulty maintaining continence: No Do you need assistance with getting out of bed/getting out of a chair/moving?: No Do you have difficulty managing or taking your medications?: No  Follow up appointments reviewed: PCP Follow-up appointment confirmed?: NA (Patient does not want to schedule at this time) Specialist Hospital Follow-up appointment confirmed?: NA Do you need transportation to your follow-up appointment?: No Do you understand care options if your condition(s) worsen?: Yes-patient verbalized understanding    SIGNATURE Any Mcneice D, CMA

## 2022-10-04 ENCOUNTER — Ambulatory Visit: Payer: Medicare Other | Attending: Cardiovascular Disease | Admitting: Cardiovascular Disease

## 2022-10-04 NOTE — Progress Notes (Deleted)
No chief complaint on file.  History of Present Illness: 77 yo male with history of DM, sleep apnea, HTN, CKD, prostate cancer, HLD, CAD and aortic stenosis who is here today for follow up. He has been followed previously by Dr. Katrinka Blazing. I am meeting him for the first time today. Cardiac cath in January 2023 with moderate mid LAD stenosis. Echo August 2023 with LVEF=55-60%. Moderately severe aortic stenosis with mean gradient 18 mmHg, AVA 0.88 cm2, SVI 34, DI 0.25.   He is here today for follow up. The patient denies any chest pain, dyspnea, palpitations, lower extremity edema, orthopnea, PND, dizziness, near syncope or syncope.    Primary Care Physician: Margaree Mackintosh, MD   Past Medical History:  Diagnosis Date   Anemia associated with chronic renal failure    Anticoagulated    plavix/  asa--- managed by cardiology   Aortic stenosis, moderate    followed by dr h. Katrinka Blazing---  last echo 08/ 2023  valve area 0.887cm^2,  mean grandient 18.64mmHg   Chronic gout    followed by pcp   (04-12-2022  per pt many yrs since last flare-up)   CKD (chronic kidney disease), stage III [facility])    nephrologist--- dr Arrie Aran   Coronary artery disease 05/2021   cardiologist--- dr h. 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[facility]) 02/2020   urologist--- dr Laverle Patter;   dx 10/ 2021, gleason 7;   05-05-2020  s/p prostatectomy   OA (osteoarthritis)    OSA on CPAP 2017   per pt uses nightly  (study in epic 11-23-2015  mild osa   PAC (premature atrial contraction)     Secondary hyperparathyroidism of renal origin (HCC)    Type 2 diabetes mellitus (HCC)    followed by dr Lafe Garin    (04-12-2022  per pt does not check blood sugar)   Umbilical hernia    Wears glasses    Wears hearing aid in both ears     Past Surgical History:  Procedure Laterality Date   COLONOSCOPY     COLONOSCOPY WITH PROPOFOL  2018   dr [name]   FACIAL FRACTURE SURGERY  1965   per pt no hardware   KNEE ARTHROSCOPY Left 09/26/2013   Procedure: LEFT KNEE ARTHROSCOPY WITH DEBRIDEMENT/SHAVING (CHONDROPLASTY), SUTURE REPAIR QUADRICEPS/HAMSTRING MUSCLE RUPTURE PRIMARY;  Surgeon: Nilda Simmer, MD;  Location: [facility];  Service: Orthopedics;  Laterality: Left;   KNEE ARTHROSCOPY Left 1999   LEFT HEART CATH AND CORONARY ANGIOGRAPHY N/A 06/09/2021   Procedure: LEFT HEART CATH AND CORONARY ANGIOGRAPHY;  Surgeon: Lennette Bihari, MD;  [facility]: [facility];  Service: Cardiovascular;  Laterality: N/A;   LYMPHADENECTOMY Bilateral 05/05/2020   Procedure: LYMPHADENECTOMY, PELVIC;  Surgeon: Heloise Purpura, MD;  Location: [facility];  Service: Urology;  Laterality: Bilateral;   QUADRICEPS TENDON REPAIR Left 09/26/2013   Procedure: REPAIR QUADRICEP TENDON;  Surgeon: Nilda Simmer, MD;  Location: [facility];  Service: Orthopedics;  Laterality: Left;   ROBOT ASSISTED LAPAROSCOPIC RADICAL PROSTATECTOMY N/A 05/05/2020   Procedure: XI ROBOTIC ASSISTED LAPAROSCOPIC RADICAL PROSTATECTOMY LEVEL 2;  Surgeon: Heloise Purpura, MD;  Location: [facility];  Service: Urology;  Laterality: N/A;   TONSILLECTOMY     child   UMBILICAL HERNIA REPAIR N/A 04/15/2022   Procedure: OPEN UMBILICAL HERNIA REPAIR WITH MESH PATCH;  Surgeon: Darnell Level, MD;  Location: [facility];  Service: General;  Laterality: N/A;  60 MINUTES LMA    Current Outpatient Medications  Medication Sig Dispense Refill   Accu-Chek FastClix Lancets MISC Apply topically.     acetaminophen (TYLENOL)  500 MG tablet Take 1,000 mg by mouth at bedtime.      albuterol (VENTOLIN HFA) 108 (90 Base) MCG/ACT inhaler Inhale 2 puffs into the lungs every 6 (six) hours as needed for wheezing or shortness of breath.  (Patient not taking: Reported on 04/12/2022)     allopurinol (ZYLOPRIM) 300 MG tablet Take 1 tablet (300 mg total) by mouth daily. (Patient taking differently: Take 300 mg by mouth at bedtime.) 90 tablet 3   aspirin EC 81 MG tablet Take 81 mg by mouth daily. One tablet by mouth daily     atorvastatin (LIPITOR) 80 MG tablet Take 1 tablet (80 mg total) by mouth daily. (Patient taking differently: Take 80 mg by mouth at bedtime.) 90 tablet 3   azelastine (ASTELIN) 0.1 % nasal spray 1-2 puffs each nostril once or twice daily as needed (Patient taking differently: Place 2 sprays into both nostrils as needed. 1-2 puffs each nostril once or twice daily as needed) 30 mL 12   bacitracin ointment Apply 1 application topically 2 (two) times daily. (Patient not taking: Reported on 04/12/2022) 14 g 0   BD PEN NEEDLE NANO 2ND GEN 32G X 4 MM MISC USE AS DIRECTED 100 each 0   benzonatate (TESSALON) 100 MG capsule Take 1-2 capsules (100-200 mg total) by mouth 3 (three) times daily as needed for cough. 60 capsule 0   Blood Glucose Monitoring Suppl (FIFTY50 GLUCOSE METER 2.0) w/Device KIT See admin instructions.     calcium carbonate (TUMS EX) 750 MG chewable tablet Chew 2 tablets by mouth at bedtime as needed.     Cholecalciferol 1.25 MG (50000 UT) capsule Take 1 capsule (50,000 Units total) by mouth once a week. (Patient taking differently: Take 50,000 Units by mouth once a week. Sunday's) 12 capsule 3   clopidogrel (PLAVIX) 75 MG tablet TAKE 1 TABLET BY MOUTH EVERY DAY 90 tablet 3   Continuous Blood Gluc Receiver (FREESTYLE LIBRE 2 READER) DEVI 1 each by Does not apply route daily. 1 each 0   Continuous Blood Gluc Sensor (FREESTYLE LIBRE 2 SENSOR) MISC 1 each by Does not apply route every 14 (fourteen) days. 6 each  3   ezetimibe (ZETIA) 10 MG tablet Take 1 tablet (10 mg total) by mouth daily. (Patient taking differently: Take 10 mg by mouth daily.) 90 tablet 3   FREESTYLE LITE test strip      HYDROcodone bit-homatropine (HYCODAN) 5-1.5 MG/5ML syrup Take 5 mLs by mouth every 8 (eight) hours as needed for cough. (Patient not taking: Reported on 04/12/2022) 120 mL 0   hydrOXYzine (ATARAX) 25 MG tablet Take 1 tablet (25 mg total) by mouth at bedtime. 90 tablet 3   ketoconazole (NIZORAL) 2 % cream Apply 1 Application topically daily as needed for irritation. 15 g 3   metoprolol succinate (TOPROL-XL) 25 MG 24 hr  tablet Take 0.5 tablets (12.5 mg total) by mouth daily. 90 tablet 2   minoxidil (ROGAINE) 2 % external solution Apply 1 application  topically daily. (Patient taking differently: Apply 1 application  topically every other day.) 60 mL 3   Multiple Vitamins-Minerals (MULTIVITAMIN ADULTS) TABS Take 1 tablet by mouth at bedtime.     nitroGLYCERIN (NITROSTAT) 0.4 MG SL tablet Place 1 tablet (0.4 mg total) under the tongue every 5 (five) minutes as needed for chest pain. 25 tablet 3   RABEprazole (ACIPHEX) 20 MG tablet Take 1 tablet (20 mg total) by mouth in the morning and at bedtime. (Patient taking differently: Take 20 mg by mouth in the morning and at bedtime.) 180 tablet 3   simethicone (MYLICON) 125 MG chewable tablet Chew 375 mg by mouth at bedtime. Take 3 tablets (375 mg) at bedtime     sitaGLIPtin-metformin (JANUMET) 50-500 MG tablet Take 2 tablets by mouth daily. (Patient taking differently: Take 2 tablets by mouth daily.) 180 tablet 3   SYNTHROID 137 MCG tablet Take 1 tablet (137 mcg total) by mouth daily before breakfast. (Patient taking differently: Take 137 mcg by mouth daily before breakfast.) 90 tablet 3   telmisartan-hydrochlorothiazide (MICARDIS HCT) 40-12.5 MG tablet Take 0.5 tablets by mouth daily. Please discard remaining portion of pill. (Patient taking differently: Take 0.25 tablets by mouth  daily. Please discard remaining portion of pill.) 90 tablet 3   Testosterone 10 MG/ACT (2%) GEL APPLY 2 PUMPS AS DIRECTED ONCE A DAY 120 g 1   traMADol (ULTRAM) 50 MG tablet Take 1-2 tablets (50-100 mg total) by mouth every 6 (six) hours as needed for moderate pain. 20 tablet 0   UNIFINE PENTIPS 32G X 4 MM MISC      VASCEPA 1 g capsule Take 2 capsules (2 g total) by mouth at bedtime. 180 capsule 3   VICTOZA 18 MG/3ML SOPN Inject 1.8 mg into the skin daily. (Patient taking differently: Inject 1.8 mg into the skin daily.) 9 mL 3   No current facility-administered medications for this visit.    Allergies  Allergen Reactions   Amlodipine     Per pt medication combo w/ atenolol caused severe muscle pain and hypotension   Atenolol     Per pt in higher doses causes bradycardia   Lisinopril Hypertension   Nsaids Other (See Comments)    Avoids due to Kidney disease   Ranitidine Hcl Itching and Other (See Comments)    IV Zantac  /   per pt complete dystonia    Social History   Socioeconomic History   Marital status: Married    Spouse name: Chaska Litterer, RN    Number of children: 2   Years of education: Not on file   Highest education level: Not on file  Occupational History    Comment: pediatric and adult endocrinologist  Tobacco Use   Smoking status: Former    Packs/day: 0.50    Years: 19.00    Additional pack years: 0.00    Total pack years: 9.50    Types: Cigarettes    Quit date: 09/21/1972    Years since quitting: 50.0   Smokeless tobacco: Never  Vaping Use   Vaping Use: Never used  Substance and Sexual Activity   Alcohol use: No    Alcohol/week: 14.0 standard drinks of alcohol    Types: 7 Glasses of wine, 7 Shots of liquor per week   Drug use: Never   Sexual activity: Not on file  Other Topics Concern   Not on file  Social History Narrative   Not on file   Social Determinants of Health   Financial Resource Strain: Not on file  Food Insecurity: Not on file   Transportation Needs: Not on file  Physical Activity: Not on file  Stress: Not on file  Social Connections: Not on file  Intimate Partner Violence: Not on file    Family History  Problem Relation Age of Onset   Breast cancer Mother    Heart disease Mother    Heart disease Father    Breast cancer Sister    Hypertension Other    Diabetes Other    Esophageal cancer Neg Hx    Rectal cancer Neg Hx    Stomach cancer Neg Hx    Prostate cancer Neg Hx    Pancreatic cancer Neg Hx    Colon cancer Neg Hx     Review of Systems:  As stated in the HPI and otherwise negative.   There were no vitals taken for this visit.  Physical Examination: General: Well developed, well nourished, NAD  HEENT: OP clear, mucus membranes moist  SKIN: warm, dry. No rashes. Neuro: No focal deficits  Musculoskeletal: Muscle strength 5/5 all ext  Psychiatric: Mood and affect normal  Neck: No JVD, no carotid bruits, no thyromegaly, no lymphadenopathy.  Lungs:Clear bilaterally, no wheezes, rhonci, crackles Cardiovascular: Regular rate and rhythm. *** Harsh systolic murmur.  Abdomen:Soft. Bowel sounds present. Non-tender.  Extremities: No lower extremity edema. Pulses are 2 + in the bilateral DP/PT.  EKG:  EKG {ACTION; IS/IS [id number]} ordered today. The ekg ordered today demonstrates   Recent Labs: 04/15/2022: BUN 40; Creatinine, Ser 1.80; Hemoglobin 16.0; Potassium 4.1; Sodium 143   Lipid Panel    Component Value Date/Time   CHOL 114 06/29/2021 0849   TRIG 100 06/29/2021 0849   HDL 45 06/29/2021 0849   CHOLHDL 2.5 06/29/2021 0849   CHOLHDL 4.6 04/15/2021 0820   LDLCALC 50 06/29/2021 0849   LDLCALC 161 (H) 04/15/2021 0820     Wt Readings from Last 3 Encounters:  04/15/22 78.3 kg  03/29/22 80.1 kg  01/13/22 80.6 kg     Assessment and Plan:   1. CAD without angina: No chest pain suggestive of angina. Continue Plavix, statin and beta blocker.   2. Aortic stenosis: Moderately severe by  echo in August 2023. Will repeat echo now.   3. HTN  4. HLD:   Labs/ tests ordered today include:  No orders of the defined types were placed in this encounter.    Disposition:   F/U with me in ***    Signed, Verne Carrow, MD, [facility] 10/04/2022 5:55 AM    [facility] [address] [place], [place], [place]  [address] Phone: [phone]; Fax: [phone]

## 2022-10-05 ENCOUNTER — Encounter: Payer: Self-pay | Admitting: Cardiovascular Disease

## 2022-11-12 NOTE — Progress Notes (Signed)
Patient Care Team: Margaree Mackintosh, MD as PCP - General (Internal Medicine) Lyn Records, MD (Inactive) as PCP - Cardiology (Cardiology)  Visit Date: 11/12/22  Subjective:    Patient ID: Peter Stall, MD , Male   DOB: [birth year], 77 y.o.    MRN: [id number]   77 y.o. Male presents for brief follow up, med refills, and office visit      Past Medical History:  Diagnosis Date   Anemia associated with chronic renal failure    Anticoagulated    plavix/  asa--- managed by cardiology   Aortic stenosis, moderate    followed by dr h. Katrinka Blazing---  last echo 08/ 2023  valve area 0.887cm^2,  mean grandient 18.104mmHg   Chronic gout    followed by pcp   (04-12-2022  per pt many yrs since last flare-up)   CKD (chronic kidney disease), stage III [facility])    nephrologist--- dr Arrie Aran   Coronary artery disease 05/2021   cardiologist--- dr h. 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   urologist--- dr Laverle Patter;   dx 10/ 2021, gleason 7;   05-05-2020  s/p prostatectomy   OA (osteoarthritis)    OSA on CPAP 2017   per pt uses nightly  (study in epic 11-23-2015  mild osa   PAC (premature atrial contraction)    Secondary hyperparathyroidism of renal origin (HCC)    Type 2 diabetes mellitus (HCC)    followed by dr Lafe Garin    (04-12-2022  per pt does not check blood sugar)   Umbilical hernia    Wears glasses    Wears hearing aid in both ears      Family History  Problem Relation Age of Onset   Breast cancer Mother    Heart disease  Mother    Heart disease Father    Breast cancer Sister    Hypertension Other    Diabetes Other    Esophageal cancer Neg Hx    Rectal cancer Neg Hx    Stomach cancer Neg Hx    Prostate cancer Neg Hx    Pancreatic cancer Neg Hx    Colon cancer Neg Hx     Social History   Social History Narrative   Not on file      ROS      Objective:   Vitals: There were no vitals taken for this visit.   Physical Exam    Results:   Studies obtained and personally reviewed by me:  Imaging, colonoscopy, mammogram, bone density scan, echocardiogram, heart cath, stress test, CT calcium score, etc.    Labs:       Component Value Date/Time   NA 143 04/15/2022 0816   NA 143 06/29/2021 0849   K 4.1 04/15/2022 0816   CL 105 04/15/2022 0816   CO2 23 06/29/2021 0849   GLUCOSE 99 04/15/2022 0816   BUN 40 (H) 04/15/2022 0816   BUN 35 (H) 06/29/2021 4782  CREATININE 1.80 (H) 04/15/2022 0816   CREATININE 1.72 (H) 04/15/2021 0820   CALCIUM 9.5 06/29/2021 0849   PROT 6.4 06/29/2021 0849   ALBUMIN 4.4 06/29/2021 0849   AST 28 06/29/2021 0849   ALT 32 06/29/2021 0849   ALKPHOS 99 06/29/2021 0849   BILITOT 0.3 06/29/2021 0849   GFRNONAA 39 (L) 05/24/2021 1158   GFRAA 41 (L) 09/25/2013 1500     Lab Results  Component Value Date   WBC 8.2 06/05/2021   HGB 16.0 04/15/2022   HCT 47.0 04/15/2022   MCV 90 06/05/2021   PLT 241 06/05/2021    Lab Results  Component Value Date   CHOL 114 06/29/2021   HDL 45 06/29/2021   LDLCALC 50 06/29/2021   TRIG 100 06/29/2021   CHOLHDL 2.5 06/29/2021    Lab Results  Component Value Date   HGBA1C 5.9 (A) 12/11/2021     Lab Results  Component Value Date   TSH 2.33 04/15/2021     Lab Results  Component Value Date   PSA 0.04 04/15/2021         Assessment & Plan:   Iverson Alamin, MD, have reviewed all documentation for this visit. The documentation on 11/26/22 for the exam, diagnosis, procedures, and orders are all accurate and  complete.    I,Alexander Ruley,acting as a Neurosurgeon for Margaree Mackintosh, MD.,have documented all relevant documentation on the behalf of Margaree Mackintosh, MD,as directed by  Margaree Mackintosh, MD while in the presence of Margaree Mackintosh, MD.   I, Margaree Mackintosh, MD, have reviewed all documentation for this visit. The documentation on 11/26/22 for the exam, diagnosis, procedures, and orders are all accurate and complete.

## 2022-11-26 ENCOUNTER — Ambulatory Visit: Payer: Medicare Other | Admitting: Internal Medicine

## 2022-11-26 ENCOUNTER — Ambulatory Visit (INDEPENDENT_AMBULATORY_CARE_PROVIDER_SITE_OTHER): Payer: Medicare Other

## 2022-11-26 ENCOUNTER — Encounter: Payer: Self-pay | Admitting: Internal Medicine

## 2022-11-26 VITALS — BP 112/62 | HR 56 | Resp 16 | Ht 63.25 in | Wt 164.2 lb

## 2022-11-26 DIAGNOSIS — Z0000 Encounter for general adult medical examination without abnormal findings: Secondary | ICD-10-CM

## 2022-11-26 DIAGNOSIS — Z23 Encounter for immunization: Secondary | ICD-10-CM | POA: Diagnosis not present

## 2022-11-26 DIAGNOSIS — E1136 Type 2 diabetes mellitus with diabetic cataract: Secondary | ICD-10-CM | POA: Diagnosis not present

## 2022-11-26 MED ORDER — EZETIMIBE 10 MG PO TABS: 10.0000 mg | ORAL_TABLET | Freq: Every day | ORAL | 3 refills | Status: DC

## 2022-11-26 MED ORDER — HYDROXYZINE HCL 25 MG PO TABS: 25.0000 mg | ORAL_TABLET | Freq: Every day | ORAL | 3 refills | Status: AC

## 2022-11-26 MED ORDER — ALLOPURINOL 300 MG PO TABS: 300.0000 mg | ORAL_TABLET | Freq: Every day | ORAL | 3 refills | Status: DC

## 2022-11-26 MED ORDER — ATORVASTATIN CALCIUM 80 MG PO TABS: 80.0000 mg | ORAL_TABLET | Freq: Every day | ORAL | 3 refills | Status: DC

## 2022-11-26 MED ORDER — RABEPRAZOLE SODIUM 20 MG PO TBEC: 20.0000 mg | DELAYED_RELEASE_TABLET | Freq: Two times a day (BID) | ORAL | 3 refills | Status: DC

## 2022-11-26 MED ORDER — METOPROLOL SUCCINATE ER 25 MG PO TB24: 12.5000 mg | ORAL_TABLET | Freq: Every day | ORAL | 2 refills | Status: AC

## 2022-11-26 MED ORDER — CLOPIDOGREL BISULFATE 75 MG PO TABS: 75.0000 mg | ORAL_TABLET | Freq: Every day | ORAL | 3 refills | Status: DC

## 2022-11-26 MED ORDER — VASCEPA 1 G PO CAPS: 2.0000 g | ORAL_CAPSULE | Freq: Every day | ORAL | 3 refills | Status: DC

## 2022-11-26 MED ORDER — SYNTHROID 137 MCG PO TABS: 137.0000 ug | ORAL_TABLET | Freq: Every day | ORAL | 3 refills | Status: DC

## 2022-11-26 MED ORDER — TRAZODONE HCL 50 MG PO TABS: 50.0000 mg | ORAL_TABLET | Freq: Every day | ORAL | 3 refills | Status: AC

## 2022-11-26 MED ORDER — VICTOZA 18 MG/3ML ~~LOC~~ SOPN: 1.8000 mg | PEN_INJECTOR | Freq: Every day | SUBCUTANEOUS | 3 refills | Status: DC

## 2022-11-26 MED ORDER — TESTOSTERONE 10 MG/ACT (2%) TD GEL: TRANSDERMAL | 3 refills | Status: DC

## 2022-11-26 MED ORDER — TELMISARTAN-HCTZ 40-12.5 MG PO TABS: 0.5000 | ORAL_TABLET | Freq: Every day | ORAL | 3 refills | Status: DC

## 2022-11-26 MED ORDER — CHOLECALCIFEROL 1.25 MG (50000 UT) PO CAPS: 50000.0000 [IU] | ORAL_CAPSULE | ORAL | 3 refills | Status: AC

## 2022-11-26 NOTE — Patient Instructions (Signed)
  [name] , Thank you for taking time to come for your Medicare Wellness Visit. I appreciate your ongoing commitment to your health goals. Please review the following plan we discussed and let me know if I can assist you in the future.   These are the goals we discussed:  Goals   None     This is a list of the screening recommended for you and due dates:  Health Maintenance  Topic Date Due   Medicare Annual Wellness Visit  Never done   Hepatitis C Screening  Never done   Pneumonia Vaccine (1 of 1 - PCV) Never done   Colon Cancer Screening  11/04/2021   Yearly kidney health urinalysis for diabetes  04/16/2022   COVID-19 Vaccine (9 - 2023-24 season) 05/11/2022   Hemoglobin A1C  06/13/2022   Yearly kidney function blood test for diabetes  06/29/2022   Zoster (Shingles) Vaccine (2 of 2) 07/12/2022   Complete foot exam   07/25/2022   Eye exam for diabetics  07/25/2022   Flu Shot  12/16/2022   DTaP/Tdap/Td vaccine (3 - Td or Tdap) 12/25/2031   HPV Vaccine  Aged Out

## 2022-11-26 NOTE — Progress Notes (Addendum)
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  Visit Complete: In person        Objective:    There were no vitals filed for this visit. There is no height or weight on file to calculate BMI.     11/26/2022    3:53 PM 04/15/2022    8:00 AM 06/09/2021    8:34 AM 11/26/2020    9:03 PM 10/04/2020   11:18 AM 05/05/2020    5:51 AM 04/28/2020    8:08 AM  Advanced Directives  Does Patient Have a Medical Advance Directive? Yes Yes Yes No No Yes Yes  Type of Estate agent of [place];Living will  Healthcare Power of [place];Living will   Living will Living will  Does patient want to make changes to medical advance directive? No - Patient declined No - Patient declined No - Patient declined   No - Patient declined   Copy of Healthcare Power of Attorney in Chart? No - copy requested  No - copy requested      Would patient like information on creating a medical advance directive?    No - Patient declined       Current Medications (verified) Outpatient Encounter Medications as of 11/26/2022  Medication Sig   Accu-Chek FastClix Lancets MISC Apply topically.   acetaminophen (TYLENOL) 500 MG tablet Take 1,000 mg by mouth at bedtime.    albuterol (VENTOLIN HFA) 108 (90 Base) MCG/ACT inhaler Inhale 2 puffs into the lungs every 6 (six) hours as needed for wheezing or shortness of breath.  (Patient not taking: Reported on 04/12/2022)   allopurinol (ZYLOPRIM) 300 MG tablet Take 1 tablet (300 mg total) by mouth daily. (Patient taking differently: Take 300 mg by mouth at bedtime.)   aspirin EC 81 MG tablet Take 81 mg by mouth daily. One tablet by mouth daily (Patient not taking: Reported on 11/26/2022)   atorvastatin (LIPITOR) 80 MG tablet Take 1 tablet (80 mg total) by mouth daily. (Patient taking differently: Take 80 mg by mouth at bedtime.)   azelastine (ASTELIN) 0.1 % nasal spray 1-2 puffs each  nostril once or twice daily as needed (Patient taking differently: Place 2 sprays into both nostrils as needed. 1-2 puffs each nostril once or twice daily as needed)   bacitracin ointment Apply 1 application topically 2 (two) times daily. (Patient not taking: Reported on 04/12/2022)   BD PEN NEEDLE NANO 2ND GEN 32G X 4 MM MISC USE AS DIRECTED   Blood Glucose Monitoring Suppl (FIFTY50 GLUCOSE METER 2.0) w/Device KIT See admin instructions. (Patient not taking: Reported on 11/26/2022)   calcium carbonate (TUMS EX) 750 MG chewable tablet Chew 2 tablets by mouth at bedtime as needed.   Cholecalciferol 1.25 MG (50000 UT) capsule Take 1 capsule (50,000 Units total) by mouth once a week. (Patient taking differently: Take 50,000 Units by mouth once a week. Sunday's)   clopidogrel (PLAVIX) 75 MG tablet TAKE 1 TABLET BY MOUTH EVERY DAY   Continuous Blood Gluc Receiver (FREESTYLE LIBRE 2 READER) DEVI 1 each by Does not apply route daily. (Patient not taking: Reported on 11/26/2022)   Continuous Blood Gluc Sensor (FREESTYLE LIBRE 2 SENSOR) MISC 1 each by Does not apply route every 14 (fourteen) days. (Patient not taking: Reported on 11/26/2022)   ezetimibe (ZETIA) 10 MG tablet Take 1 tablet (10 mg total) by mouth daily. (Patient taking differently: Take 10 mg by mouth daily.)  FREESTYLE LITE test strip  (Patient not taking: Reported on 11/26/2022)   HYDROcodone bit-homatropine (HYCODAN) 5-1.5 MG/5ML syrup Take 5 mLs by mouth every 8 (eight) hours as needed for cough. (Patient not taking: Reported on 04/12/2022)   hydrOXYzine (ATARAX) 25 MG tablet Take 1 tablet (25 mg total) by mouth at bedtime.   ketoconazole (NIZORAL) 2 % cream Apply 1 Application topically daily as needed for irritation. (Patient not taking: Reported on 11/26/2022)   metoprolol succinate (TOPROL-XL) 25 MG 24 hr tablet Take 0.5 tablets (12.5 mg total) by mouth daily.   minoxidil (ROGAINE) 2 % external solution Apply 1 application  topically daily.  (Patient not taking: Reported on 11/26/2022)   Multiple Vitamins-Minerals (MULTIVITAMIN ADULTS) TABS Take 1 tablet by mouth at bedtime.   nitroGLYCERIN (NITROSTAT) 0.4 MG SL tablet Place 1 tablet (0.4 mg total) under the tongue every 5 (five) minutes as needed for chest pain.   RABEprazole (ACIPHEX) 20 MG tablet Take 1 tablet (20 mg total) by mouth in the morning and at bedtime. (Patient taking differently: Take 20 mg by mouth in the morning and at bedtime.)   simethicone (MYLICON) 125 MG chewable tablet Chew 375 mg by mouth at bedtime. Take 3 tablets (375 mg) at bedtime (Patient not taking: Reported on 11/26/2022)   sitaGLIPtin-metformin (JANUMET) 50-500 MG tablet Take 2 tablets by mouth daily.   SYNTHROID 137 MCG tablet Take 1 tablet (137 mcg total) by mouth daily before breakfast. (Patient taking differently: Take 137 mcg by mouth daily before breakfast.)   telmisartan-hydrochlorothiazide (MICARDIS HCT) 40-12.5 MG tablet Take 0.5 tablets by mouth daily. Please discard remaining portion of pill. (Patient taking differently: Take 0.25 tablets by mouth daily. Please discard remaining portion of pill.)   Testosterone 10 MG/ACT (2%) GEL APPLY 2 PUMPS AS DIRECTED ONCE A DAY   UNIFINE PENTIPS 32G X 4 MM MISC  (Patient not taking: Reported on 11/26/2022)   VASCEPA 1 g capsule Take 2 capsules (2 g total) by mouth at bedtime.   VICTOZA 18 MG/3ML SOPN Inject 1.8 mg into the skin daily. (Patient taking differently: Inject 1.8 mg into the skin daily.)   No facility-administered encounter medications on file as of 11/26/2022.    Allergies (verified) Amlodipine, Atenolol, Lisinopril, Nsaids, and Ranitidine hcl   History: Past Medical History:  Diagnosis Date   Anemia associated with chronic renal failure    Anticoagulated    plavix/  asa--- managed by cardiology   Aortic stenosis, moderate    followed by dr h. Katrinka Blazing---  last echo 08/ 2023  valve area 0.887cm^2,  mean grandient 18.40mmHg   Chronic gout     followed by pcp   (04-12-2022  per pt many yrs since last flare-up)   CKD (chronic kidney disease), stage III [facility])    nephrologist--- dr Arrie Aran   Coronary artery disease 05/2021   cardiologist--- dr h. 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   urologist--- dr Laverle Patter;  dx 10/ 2021, gleason 7;   05-05-2020  s/p prostatectomy   OA (osteoarthritis)    OSA on CPAP 2017   per pt uses nightly  (study in epic 11-23-2015  mild osa   PAC (premature atrial contraction)    Secondary hyperparathyroidism of renal origin (HCC)    Type 2 diabetes mellitus (HCC)    followed by dr Lafe Garin    (04-12-2022  per pt does not check blood sugar)   Umbilical hernia    Wears glasses    Wears hearing aid in both ears    Past Surgical History:  Procedure Laterality Date   COLONOSCOPY     COLONOSCOPY WITH PROPOFOL  2018   dr [name]   FACIAL FRACTURE SURGERY  1965   per pt no hardware   KNEE ARTHROSCOPY Left 09/26/2013   Procedure: LEFT KNEE ARTHROSCOPY WITH DEBRIDEMENT/SHAVING (CHONDROPLASTY), SUTURE REPAIR QUADRICEPS/HAMSTRING MUSCLE RUPTURE PRIMARY;  Surgeon: Nilda Simmer, MD;  Location: [facility];  Service: Orthopedics;  Laterality: Left;   KNEE ARTHROSCOPY Left 1999   LEFT HEART CATH AND CORONARY ANGIOGRAPHY N/A 06/09/2021   Procedure: LEFT HEART CATH AND CORONARY ANGIOGRAPHY;  Surgeon: Lennette Bihari, MD;  Location: [facility];  Service: Cardiovascular;  Laterality: N/A;   LYMPHADENECTOMY Bilateral 05/05/2020    Procedure: LYMPHADENECTOMY, PELVIC;  Surgeon: Heloise Purpura, MD;  Location: [facility];  Service: Urology;  Laterality: Bilateral;   QUADRICEPS TENDON REPAIR Left 09/26/2013   Procedure: REPAIR QUADRICEP TENDON;  Surgeon: Nilda Simmer, MD;  Location: [facility];  Service: Orthopedics;  Laterality: Left;   ROBOT ASSISTED LAPAROSCOPIC RADICAL PROSTATECTOMY N/A 05/05/2020   Procedure: XI ROBOTIC ASSISTED LAPAROSCOPIC RADICAL PROSTATECTOMY LEVEL 2;  Surgeon: Heloise Purpura, MD;  Location: [facility];  Service: Urology;  Laterality: N/A;   TONSILLECTOMY     child   UMBILICAL HERNIA REPAIR N/A 04/15/2022   Procedure: OPEN UMBILICAL HERNIA REPAIR WITH MESH PATCH;  Surgeon: Darnell Level, MD;  Location: [facility];  Service: General;  Laterality: N/A;  60 MINUTES LMA   Family History  Problem Relation Age of Onset   Breast cancer Mother    Heart disease Mother    Heart disease Father    Breast cancer Sister    Hypertension Other    Diabetes Other    Esophageal cancer Neg Hx    Rectal cancer Neg Hx    Stomach cancer Neg Hx    Prostate cancer Neg Hx    Pancreatic cancer Neg Hx    Colon cancer Neg Hx    Social History   Socioeconomic History   Marital status: Married    Spouse name: Moaaz Bonczek, RN    Number of children: 2   Years of education: Not on file   Highest education level: Not on file  Occupational History    Comment: pediatric and adult endocrinologist  Tobacco Use   Smoking status: Former    Current packs/day: 0.00    Average packs/day: 0.5 packs/day for 19.0 years (9.5 ttl pk-yrs)    Types: Cigarettes    Start date: 09/21/1953    Quit date: 09/21/1972    Years since quitting: 50.2   Smokeless tobacco: Never  Vaping Use   Vaping status: Never Used  Substance and Sexual Activity   Alcohol use: No    Alcohol/week: 14.0 standard drinks of alcohol    Types: 7 Glasses of wine, 7 Shots of liquor per week   Drug use: Never  Sexual activity:  Not on file  Other Topics Concern   Not on file  Social History Narrative   Not on file   Social Determinants of Health   Financial Resource Strain: Low Risk  (11/26/2022)   Overall Financial Resource Strain (CARDIA)    Difficulty of Paying Living Expenses: Not hard at all  Food Insecurity: No Food Insecurity (11/26/2022)   Hunger Vital Sign    Worried About Running Out of Food in the Last Year: Never true    Ran Out of Food in the Last Year: Never true  Transportation Needs: No Transportation Needs (11/26/2022)   PRAPARE - Administrator, Civil Service (Medical): No    Lack of Transportation (Non-Medical): No  Physical Activity: Sufficiently Active (11/26/2022)   Exercise Vital Sign    Days of Exercise per Week: 7 days    Minutes of Exercise per Session: 40 min  Stress: No Stress Concern Present (11/26/2022)   [employer] of Occupational Health - Occupational Stress Questionnaire    Feeling of Stress : Only a little  Social Connections: Moderately Integrated (11/26/2022)   Social Connection and Isolation Panel [NHANES]    Frequency of Communication with Friends and Family: More than three times a week    Frequency of Social Gatherings with Friends and Family: Once a week    Attends Religious Services: Never    Database administrator or Organizations: Yes    Attends Banker Meetings: 1 to 4 times per year    Marital Status: Married    Tobacco Counseling Counseling given: Not Answered   Clinical Intake:                        Activities of Daily Living    04/15/2022    8:10 AM  In your present state of health, do you have any difficulty performing the following activities:  Hearing? 0  Vision? 0  Difficulty concentrating or making decisions? 0  Walking or climbing stairs? 0  Dressing or bathing? 0    Patient Care Team: Margaree Mackintosh, MD as PCP - General (Internal Medicine) Lyn Records, MD (Inactive) as PCP - Cardiology  (Cardiology)  Indicate any recent Medical Services you may have received from other than [facility] providers in the past year (date may be approximate).     Assessment:   This is a routine wellness examination for Virgal.  Hearing/Vision screen Hearing Screening - Comments:: Wears hearing aids  Dietary issues and exercise activities discussed:     Goals Addressed   None   Depression Screen    11/26/2022    3:54 PM 03/20/2021    2:14 PM  PHQ 2/9 Scores  PHQ - 2 Score 0 0    Fall Risk    11/26/2022    3:53 PM  Fall Risk   Falls in the past year? 0  Number falls in past yr: 0  Injury with Fall? 0  Risk for fall due to : No Fall Risks  Follow up Falls evaluation completed    MEDICARE RISK AT HOME:  Medicare Risk at Home - 11/26/22 1553     Any stairs in or around the home? Yes    If so, are there any without handrails? No    Home free of loose throw rugs in walkways, pet beds, electrical cords, etc? No    Adequate lighting in your home to reduce risk of falls? Yes  Life alert? No    Use of a cane, walker or w/c? No    Grab bars in the bathroom? No    Shower chair or bench in shower? No    Elevated toilet seat or a handicapped toilet? No             TIMED UP AND GO:  Was the test performed?  No    Cognitive Function:        11/26/2022    3:54 PM  6CIT Screen  What Year? 0 points  What month? 0 points  What time? 0 points  Count back from 20 0 points  Months in reverse 0 points  Repeat phrase 0 points  Total Score 0 points    Immunizations Immunization History  Administered Date(s) Administered   COVID-19, mRNA, vaccine(Comirnaty)12 years and older 03/16/2022   Influenza Split 02/01/2014, 01/30/2016   Influenza, High Dose Seasonal PF 02/06/2018, 01/27/2020, 03/16/2022   Influenza-Unspecified 01/30/2016, 03/04/2021   PFIZER Comirnaty(Gray Top)Covid-19 Tri-Sucrose Vaccine 09/21/2020   PFIZER(Purple Top)SARS-COV-2 Vaccination 05/09/2019,  05/30/2019, 01/19/2020   Pfizer Covid-19 Vaccine Bivalent Booster 73yrs & up 03/03/2021   Respiratory Syncytial Virus Vaccine,Recomb Aduvanted(Arexvy) 04/28/2022   Tdap 10/04/2020, 12/24/2021    TDAP status: Up to date  Flu Vaccine status: Up to date  Pneumococcal vaccine status: Up to date  Covid-19 vaccine status: Information provided on how to obtain vaccines.   Qualifies for Shingles Vaccine? Yes   Zostavax completed No   Shingrix Completed?: No.    Education has been provided regarding the importance of this vaccine. Patient has been advised to call insurance company to determine out of pocket expense if they have not yet received this vaccine. Advised may also receive vaccine at local pharmacy or Health Dept. Verbalized acceptance and understanding.  Screening Tests Health Maintenance  Topic Date Due   Hepatitis C Screening  Never done   Pneumonia Vaccine 28+ Years old (1 of 1 - PCV) Never done   Colonoscopy  11/04/2021   Diabetic kidney evaluation - Urine ACR  04/16/2022   COVID-19 Vaccine (9 - 2023-24 season) 05/11/2022   HEMOGLOBIN A1C  06/13/2022   Diabetic kidney evaluation - eGFR measurement  06/29/2022   Zoster Vaccines- Shingrix (2 of 2) 07/12/2022   FOOT EXAM  07/25/2022   OPHTHALMOLOGY EXAM  07/25/2022   INFLUENZA VACCINE  12/16/2022   Medicare Annual Wellness (AWV)  11/26/2023   DTaP/Tdap/Td (3 - Td or Tdap) 12/25/2031   HPV VACCINES  Aged Out    Health Maintenance  Health Maintenance Due  Topic Date Due   Hepatitis C Screening  Never done   Pneumonia Vaccine 51+ Years old (1 of 1 - PCV) Never done   Colonoscopy  11/04/2021   Diabetic kidney evaluation - Urine ACR  04/16/2022   COVID-19 Vaccine (9 - 2023-24 season) 05/11/2022   HEMOGLOBIN A1C  06/13/2022   Diabetic kidney evaluation - eGFR measurement  06/29/2022   Zoster Vaccines- Shingrix (2 of 2) 07/12/2022   FOOT EXAM  07/25/2022   OPHTHALMOLOGY EXAM  07/25/2022    Vision Screening:  Recommended annual ophthalmology exams for early detection of glaucoma and other disorders of the eye. Is the patient up to date with their annual eye exam?  Yes  Who is the provider or what is the name of the office in which the patient attends annual eye exams?  If pt is not established with a provider, would they like to be referred to a provider to establish  care? No .   Dental Screening: Recommended annual dental exams for proper oral hygiene    Community Resource Referral / Chronic Care Management: CRR required this visit?  No   CCM required this visit?  No     Plan:     I have personally reviewed and noted the following in the patient's chart:   Medical and social history Use of alcohol, tobacco or illicit drugs  Current medications and supplements including opioid prescriptions. Patient is not currently taking opioid prescriptions. Functional ability and status Nutritional status Physical activity Advanced directives List of other physicians Hospitalizations, surgeries, and ER visits in previous 12 months Vitals Screenings to include cognitive, depression, and falls Referrals and appointments  In addition, I have reviewed and discussed with patient certain preventive protocols, quality metrics, and best practice recommendations. A written personalized care plan for preventive services as well as general preventive health recommendations were provided to patient.     Jama Flavors, CMA   11/26/2022   I, Margaree Mackintosh, MD, have reviewed all documentation for this visit. The documentation on 11/26/22 for the exam, diagnosis, procedures, and orders are all accurate and complete.

## 2022-11-26 NOTE — Progress Notes (Addendum)
   Subjective:    Patient ID: Peter Stall, [name], male    DOB: [birth year], 77 y.o.   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 His Cardiologist, Dr. Garnette Scheuermann has retired. He had a couple of studies done at the [place] including  an Echo and a nuclear stress test  copies of which are included under CV procedures in epic in EPIC.  Echo showed mild calcific aortic stenosis.  Mean gradient 18 to 20 mmHg.  Trivial aortic regurgitation.  Trivial to mild mitral regurgitation.  Mildly elevated pulmonary artery pressure at 34 mmHg.  No pericardial effusion.  Nuclear stress test on November 11, 2022 done because of atypical chest pain showed ejection fraction calculated at 62% during stress.  Findings were concerning for mild inducible ischemia involving the proximal inferior septal wall.  He says he will follow-up with [facility].  Currently not having chest pain.  Vaccines reviewed.  Recommendations made.  Pneumococcal 20 vaccine given today.  Review of Systems currently no complaint of chest pain or shortness of breath     Objective:   Physical Exam  Vital signs reviewed.  2/6 systolic ejection murmur.  Irregular rhythm. Chest is clear to auscultation.  No carotid bruits.      Assessment & Plan:   History of moderate aortic stenosis  History of hypertension  History of type 2 diabetes mellitus with stage IIIa chronic kidney disease without use of insulin  Palpitations-recently symptomatic and will follow-up with [facility] Cardiology  History of coronary artery disease with stable angina pectoris.  History of cardiac catheterization mild to moderate coronary artery disease with very small first diagonal vessel having 70% ostial stenosis and 65 to 70% LAD stenosis after the second diagonal vessel.  Mild nonobstructive plaque in the dominant left circumflex vessel and small nondominant RCA.  10 to 15 mm peak aortic gradient on LV to AO pullback which was  variable due to the presence of PACs.  History of stage IIIb chronic kidney disease  History of COVID-19 in May 2023  History of prostate cancer seen by Dr. Laverle Patter in 2023

## 2022-11-26 NOTE — Progress Notes (Addendum)
Patient Care Team: Margaree Mackintosh, MD as PCP - General (Internal Medicine) Lyn Records, MD (Inactive) as PCP - Cardiology (Cardiology)  Visit Date: 11/26/22  Subjective:    Patient ID: Peter Stall, MD , Male   DOB: 1946-04-25, 77 y.o.    MRN: [id number]   77 y.o. Male presents today for follow up and discussion of his medications. He is requesting refills for his medications.   Overall, he appears well and states that he generally is feeling fatigued, mostly from working long hours at the [place]. He remains active and is exercising every day, usually by walking his dog 30-40 minutes. No anginal symptoms.  About 3 weeks ago he had another episode of chest pain, localized to his lower left chest, a little lateral to the sternum. He describes the pain as a pressure/burning sensation that lasted for about a few minutes, similar to what he has experienced in the past. In the interim he has completed an echocardiogram and nuclear stress test. His echocardiogram revealed LVEF 59%, mildly dilated right atrium, mild calcific aortic stenosis, trivial aortic regurgitation and trivial to mild mitral regurgitation. Nuclear stress test revealed mild inducible ischemia involving the proximal inferior septal wall; ejection fraction 62% during stress. He reports that Dr. Katrinka Blazing retired, and he is in the process of finding a new cardiologist. Denies any recurring episodes of PSVT since January; at that time he felt that the PSVT had exacerbated his chest pain.  On 08/28/2022 he presented to the [facility] ED for concerns of epistaxis. Underwent cauterization with resolution of his epistaxis.   Past Medical History:  Diagnosis Date   Anemia associated with chronic renal failure    Anticoagulated    plavix/  asa--- managed by cardiology   Aortic stenosis, moderate    followed by dr h. Katrinka Blazing---  last echo 08/ 2023  valve area 0.887cm^2,  mean grandient 18.50mmHg   Chronic gout    followed by pcp    (04-12-2022  per pt many yrs since last flare-up)   CKD (chronic kidney disease), stage III [facility])    nephrologist--- dr Arrie Aran   Coronary artery disease 05/2021   cardiologist--- dr h. 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   urologist--- dr Laverle Patter;   dx 10/ 2021, gleason 7;   05-05-2020  s/p prostatectomy   OA (osteoarthritis)    OSA on CPAP 2017   per pt uses nightly  (study in epic 11-23-2015  mild osa   PAC (premature atrial contraction)    Secondary hyperparathyroidism of renal origin (HCC)    Type 2 diabetes mellitus (HCC)    followed by dr Lafe Garin    (04-12-2022  per pt does not check blood sugar)   Umbilical hernia    Wears glasses    Wears hearing aid in both ears      Family History  Problem Relation Age of Onset   Breast cancer Mother  Heart disease Mother    Heart disease Father    Breast cancer Sister    Hypertension Other    Diabetes Other    Esophageal cancer Neg Hx    Rectal cancer Neg Hx    Stomach cancer Neg Hx    Prostate cancer Neg Hx    Pancreatic cancer Neg Hx    Colon cancer Neg Hx     Social History   Social History Narrative   Not on file      Review of Systems  Constitutional:  Negative for chills and fever.  HENT:  Negative for nosebleeds and sore throat.   Eyes:  Negative for blurred vision and pain.  Respiratory:  Negative for cough and shortness of breath.   Cardiovascular:  Positive for chest pain (Isolated episode). Negative for palpitations and leg swelling.   Gastrointestinal:  Negative for blood in stool, diarrhea, nausea and vomiting.  Genitourinary:  Negative for dysuria and hematuria.  Musculoskeletal:  Negative for back pain.  Skin:  Negative for rash.  Neurological:  Negative for dizziness and headaches.  Psychiatric/Behavioral:  Negative for depression. The patient is not nervous/anxious.         Objective:   Vitals: BP 112/62   Pulse (!) 56   Resp 16   Ht 5' 3.25" (1.607 m)   Wt 164 lb 4 oz (74.5 kg)   SpO2 96%   BMI 28.87 kg/m    Physical Exam Constitutional:      General: He is not in acute distress.    Appearance: Normal appearance. He is not ill-appearing.  HENT:     Head: Normocephalic and atraumatic.  Cardiovascular:     Rate and Rhythm: Normal rate. Rhythm irregular.     Heart sounds: Murmur heard.     Systolic murmur is present with a grade of 2/6.  Skin:    General: Skin is warm and dry.  Neurological:     General: No focal deficit present.     Mental Status: He is alert and oriented to person, place, and time.  Psychiatric:        Mood and Affect: Mood normal.        Behavior: Behavior normal.        Thought Content: Thought content normal.        Judgment: Judgment normal.       Results:   Studies obtained and personally reviewed by me:  Imaging, colonoscopy, mammogram, bone density scan, echocardiogram, heart cath, stress test, CT calcium score, etc.   Labs:       Component Value Date/Time   NA 143 04/15/2022 0816   NA 143 06/29/2021 0849   K 4.1 04/15/2022 0816   CL 105 04/15/2022 0816   CO2 23 06/29/2021 0849   GLUCOSE 99 04/15/2022 0816   BUN 40 (H) 04/15/2022 0816   BUN 35 (H) 06/29/2021 0849   CREATININE 1.80 (H) 04/15/2022 0816   CREATININE 1.72 (H) 04/15/2021 0820   CALCIUM 9.5 06/29/2021 0849   PROT 6.4 06/29/2021 0849   ALBUMIN 4.4 06/29/2021 0849   AST 28 06/29/2021 0849   ALT 32 06/29/2021 0849   ALKPHOS 99 06/29/2021 0849   BILITOT 0.3 06/29/2021 0849   GFRNONAA 39  (L) 05/24/2021 1158   GFRAA 41 (L) 09/25/2013 1500     Lab Results  Component Value Date   WBC 8.2 06/05/2021   HGB 16.0 04/15/2022   HCT 47.0 04/15/2022   MCV 90 06/05/2021   PLT 241  06/05/2021    Lab Results  Component Value Date   CHOL 114 06/29/2021   HDL 45 06/29/2021   LDLCALC 50 06/29/2021   TRIG 100 06/29/2021   CHOLHDL 2.5 06/29/2021    Lab Results  Component Value Date   HGBA1C 5.9 (A) 12/11/2021     Lab Results  Component Value Date   TSH 2.33 04/15/2021     Lab Results  Component Value Date   PSA 0.04 04/15/2021      Assessment & Plan:   He is requesting refills for all of his medications today.  Vaccine Counseling - We will administer the pneumococcal 20 vaccine today.  He will contact [facility] for follow-up.   I,Mathew Stumpf,acting as a Neurosurgeon for Margaree Mackintosh, MD.,have documented all relevant documentation on the behalf of Margaree Mackintosh, MD,as directed by  Margaree Mackintosh, MD while in the presence of Margaree Mackintosh, MD.  I, Margaree Mackintosh, MD, have reviewed all documentation for this visit. The documentation on 11/26/22 for the exam, diagnosis, procedures, and orders are all accurate and complete.

## 2022-11-26 NOTE — Addendum Note (Signed)
Addended by: Margaree Mackintosh on: 11/26/2022 04:55 PM   Modules accepted: Level of Service

## 2022-12-29 DIAGNOSIS — M4722 Other spondylosis with radiculopathy, cervical region: Secondary | ICD-10-CM | POA: Diagnosis not present

## 2023-01-11 DIAGNOSIS — Z85828 Personal history of other malignant neoplasm of skin: Secondary | ICD-10-CM | POA: Diagnosis not present

## 2023-01-11 DIAGNOSIS — D225 Melanocytic nevi of trunk: Secondary | ICD-10-CM | POA: Diagnosis not present

## 2023-01-11 DIAGNOSIS — L570 Actinic keratosis: Secondary | ICD-10-CM | POA: Diagnosis not present

## 2023-01-11 DIAGNOSIS — L723 Sebaceous cyst: Secondary | ICD-10-CM | POA: Diagnosis not present

## 2023-01-11 DIAGNOSIS — L821 Other seborrheic keratosis: Secondary | ICD-10-CM | POA: Diagnosis not present

## 2023-02-10 IMAGING — CT CT HEART MORP W/ CTA COR W/ SCORE W/ CA W/CM &/OR W/O CM
3 of 9 series · 6 of 20 positions shown, 7 images · IV contrast (omnipaque)
Comparison: Chest radiograph 05/24/2021. No prior CT.
COMPARISON: Chest radiograph 05/24/2021. No prior CT.

Addendum:
EXAM:
OVER-READ INTERPRETATION  CT CHEST

The following report is an over-read performed by radiologist Dr.
Mustapha M Giadom [REDACTED] on 06/02/2021. This over-read
does not include interpretation of cardiac or coronary anatomy or
pathology. The coronary CTA interpretation by the cardiologist is
attached.
HISTORY: Chest pain/anginal equiv, ECGs and troponins normal
Cardiac/Coronary CT
TECHNIQUE: The patient was scanned on a Siemens Force scanner.
PROTOCOL: A 120 kV prospective scan was triggered in the descending thoracic
aorta at 111 HU's. Axial non-contrast 3 mm slices were carried out
through the heart. The data set was analyzed on a dedicated work
station and scored using the Agatson method. Gantry rotation speed
was 250 msecs and collimation was 0.6 mm. Heart rate optimized
medically, and 0.8 mg of sublingual nitroglycerin was given. The 3D
data set was reconstructed in 5% intervals of 35-75% of the R-R
cycle. Diastolic phases were analyzed on a dedicated work station
using MPR, MIP and VRT modes. The patient received 80mL OMNIPAQUE
IOHEXOL 350 MG/ML SOLN of contrast.

[Series 7: ts syst · axial · 0.39mm/px · z∈[-172,-134]mm · 2 of 284 slices shown, 3 images]
[im 95/284  vessel]
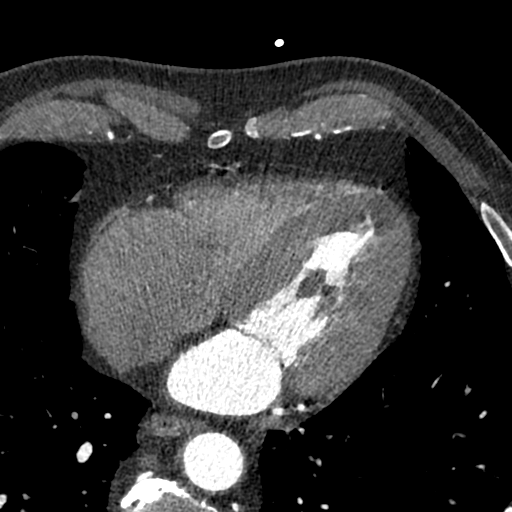
[im 95/284  lung]
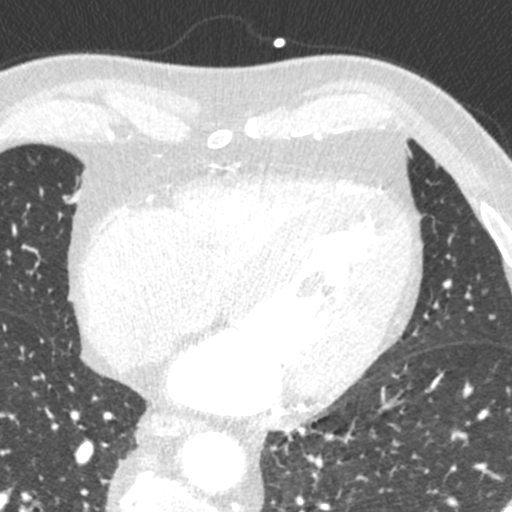
[im 189/284  vessel]
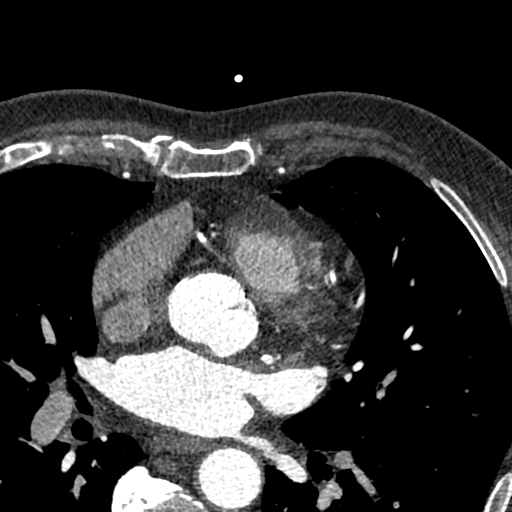

[Series 8: best syst · axial · 0.39mm/px · z∈[-172,-134]mm · 2 of 284 slices shown]
[im 95/284  vessel]
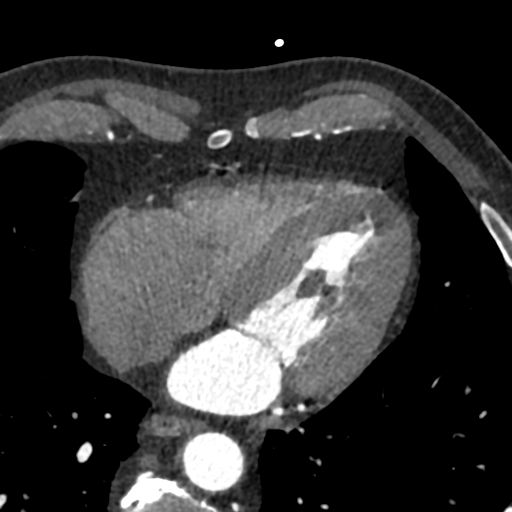
[im 189/284  vessel]
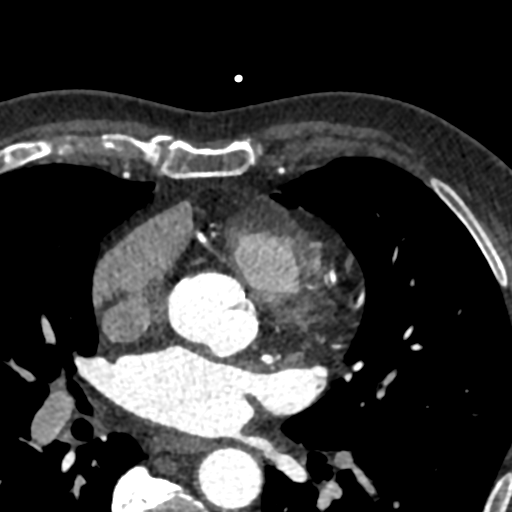

[Series 9: best diast · axial · 0.39mm/px · z∈[-172,-134]mm · 2 of 284 slices shown]
[im 95/284  vessel]
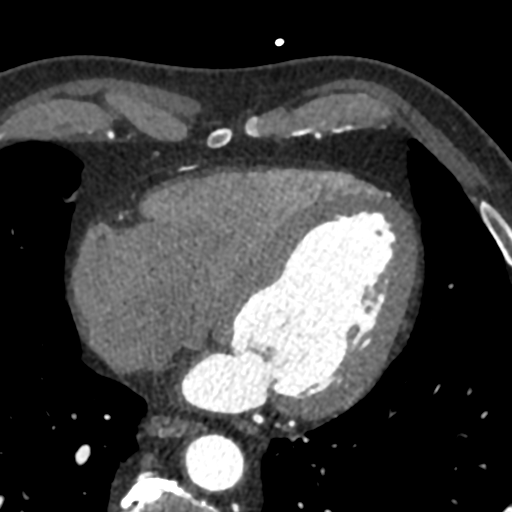
[im 189/284  vessel]
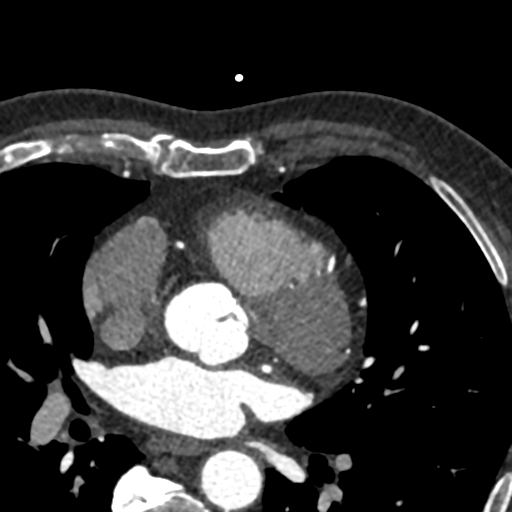

[6 of 20 positions shown; findings below may reference images not displayed]

FINDINGS: Vascular: Aortic atherosclerosis. No central pulmonary embolism, on
this non-dedicated study.

Mediastinum/Nodes: No imaged thoracic adenopathy.

Lungs/Pleura: No pleural fluid.  Clear imaged lungs.

Upper Abdomen: Normal imaged portions of the liver, spleen, stomach.

Musculoskeletal: Lateral left rib posttraumatic or developmental
defect. Lower thoracic spondylosis.
IMPRESSION: 1.  No acute findings in the imaged extracardiac chest.
2.  Aortic Atherosclerosis (E9Z3I-OIX.X).
FINDINGS: Coronary calcium score: The patient's coronary artery calcium score
is 128, which places the patient in the 36th percentile.

Coronary arteries: Normal coronary origins.  Left dominance.

Right Coronary Artery: Normal caliber vessel at origin and tapers
into small caliber. No significant plaque or stenosis.

Left Main Coronary Artery: Normal caliber vessel. No significant
plaque or stenosis, though there is significant calcium of the
coronary cusp near the origin of the left main.

Left Anterior Descending Coronary Artery: Normal caliber vessel.
Focal calcified plaque in proximal LAD with 1-24% stenosis. There is
mixed calcified and noncalcified plaque in the mid LAD with 1-24%
stenosis. Gives rise to 2 diagonal branches. There appears to be
70-99% ostial stenosis of D1, but it is a small caliber vessel
beyond the stenosis.

Left Circumflex Artery: Normal caliber vessel, gives rise to PDA.
There is mixed calcified and noncalcified plaque in the proximal and
mid LCx with maximum 1-24% stenosis. Gives rise to 2 OM branches
and PL branch plus PDA.

Aorta: Normal size, 31 mm at the mid ascending aorta (level of the
PA bifurcation) measured double oblique. Calcifications consistent
with. No dissection seen in visualized portions of the aorta.

Aortic Valve: Scattered calcifications. Trileaflet.

Other findings:

Normal pulmonary vein drainage into the left atrium.

Normal left atrial appendage without a thrombus.

Normal size of the pulmonary artery.

Normal appearance of the pericardium.
IMPRESSION: 1. Severe CAD, CADRADS = 4. Ostial stenosis of D1 appears 70-99%,
but small caliber vessel. Otherwise there appears to be
nonobstructive CAD in the main coronary vessels. CT FFR will be
performed and reported separately.

2. Coronary calcium score of 128. This was 36th percentile for age
and sex matched control.

3. Normal coronary origin with left dominance.

INTERPRETATION:

1. CAD-RADS 0: No evidence of CAD (0%). Consider non-atherosclerotic
causes of chest pain.

2. CAD-RADS 1: Minimal non-obstructive CAD (0-24%). Consider
non-atherosclerotic causes of chest pain. Consider preventive
therapy and risk factor modification.

3. CAD-RADS 2: Mild non-obstructive CAD (25-49%). Consider
non-atherosclerotic causes of chest pain. Consider preventive
therapy and risk factor modification.

4. CAD-RADS 3: Moderate stenosis (50-69%). Consider symptom-guided
anti-ischemic pharmacotherapy as well as risk factor modification
per guideline directed care. Additional analysis with CT FFR will be
submitted.

5. CAD-RADS 4: Severe stenosis. (70-99% or > 50% left main). Cardiac
catheterization or CT FFR is recommended. Consider symptom-guided
anti-ischemic pharmacotherapy as well as risk factor modification
per guideline directed care. Invasive coronary angiography
recommended with revascularization per published guideline
statements.

6. CAD-RADS 5: Total coronary occlusion (100%). Consider cardiac
catheterization or viability assessment. Consider symptom-guided
anti-ischemic pharmacotherapy as well as risk factor modification
per guideline directed care.

7. CAD-RADS N: Non-diagnostic study. Obstructive CAD can't be
excluded. Alternative evaluation is recommended.

*** End of Addendum ***
EXAM:
OVER-READ INTERPRETATION  CT CHEST

The following report is an over-read performed by radiologist Dr.
Mustapha M Giadom [REDACTED] on 06/02/2021. This over-read
does not include interpretation of cardiac or coronary anatomy or
pathology. The coronary CTA interpretation by the cardiologist is
attached.
FINDINGS: Vascular: Aortic atherosclerosis. No central pulmonary embolism, on
this non-dedicated study.

Mediastinum/Nodes: No imaged thoracic adenopathy.

Lungs/Pleura: No pleural fluid.  Clear imaged lungs.

Upper Abdomen: Normal imaged portions of the liver, spleen, stomach.

Musculoskeletal: Lateral left rib posttraumatic or developmental
defect. Lower thoracic spondylosis.
IMPRESSION: 1.  No acute findings in the imaged extracardiac chest.
2.  Aortic Atherosclerosis (E9Z3I-OIX.X).

## 2023-02-27 ENCOUNTER — Other Ambulatory Visit: Payer: Self-pay | Admitting: Internal Medicine

## 2023-03-01 ENCOUNTER — Telehealth: Payer: Self-pay

## 2023-03-01 NOTE — Telephone Encounter (Signed)
Patient states that he is working full time at the [place] now (8:00 AM-5:00 PM) and will have to call back to schedule an appointment once he finds the time.

## 2023-03-01 NOTE — Telephone Encounter (Signed)
Pt needs to be seen for more refills.

## 2023-04-09 ENCOUNTER — Other Ambulatory Visit: Payer: Self-pay | Admitting: Internal Medicine

## 2023-06-03 ENCOUNTER — Other Ambulatory Visit: Payer: Self-pay | Admitting: Internal Medicine

## 2023-06-06 ENCOUNTER — Other Ambulatory Visit: Payer: Self-pay | Admitting: Internal Medicine

## 2023-06-23 DIAGNOSIS — Z85828 Personal history of other malignant neoplasm of skin: Secondary | ICD-10-CM | POA: Diagnosis not present

## 2023-06-23 DIAGNOSIS — L738 Other specified follicular disorders: Secondary | ICD-10-CM | POA: Diagnosis not present

## 2023-07-04 ENCOUNTER — Telehealth: Payer: Self-pay | Admitting: Internal Medicine

## 2023-07-04 DIAGNOSIS — J09X2 Influenza due to identified novel influenza A virus with other respiratory manifestations: Secondary | ICD-10-CM | POA: Diagnosis not present

## 2023-07-04 DIAGNOSIS — R509 Fever, unspecified: Secondary | ICD-10-CM | POA: Diagnosis not present

## 2023-07-04 DIAGNOSIS — J209 Acute bronchitis, unspecified: Secondary | ICD-10-CM | POA: Diagnosis not present

## 2023-07-04 MED ORDER — OSELTAMIVIR PHOSPHATE 75 MG PO CAPS: 75.0000 mg | ORAL_CAPSULE | Freq: Two times a day (BID) | ORAL | 0 refills | Status: DC

## 2023-07-04 NOTE — Telephone Encounter (Signed)
Copied from CRM [id number]. Topic: Clinical - Medication Question >> Jul 04, 2023  2:26 PM Alcus Dad H wrote: Reason for CRM: Dr. Fransico Philander will be back in town tomorrow and wants to know if Dr. Lenord Fellers can call him Tamiflu in to the [facility] pharmacy in [place] because he was visiting his son who has Type A flu.

## 2023-08-17 ENCOUNTER — Encounter: Payer: Self-pay | Admitting: Gastroenterology

## 2023-09-16 ENCOUNTER — Other Ambulatory Visit: Payer: Self-pay

## 2023-09-16 ENCOUNTER — Other Ambulatory Visit: Payer: Self-pay | Admitting: Internal Medicine

## 2023-09-16 DIAGNOSIS — K219 Gastro-esophageal reflux disease without esophagitis: Secondary | ICD-10-CM

## 2023-09-16 DIAGNOSIS — E782 Mixed hyperlipidemia: Secondary | ICD-10-CM

## 2023-09-16 DIAGNOSIS — I10 Essential (primary) hypertension: Secondary | ICD-10-CM

## 2023-09-16 MED ORDER — TELMISARTAN-HCTZ 40-12.5 MG PO TABS: 0.5000 | ORAL_TABLET | Freq: Every day | ORAL | 3 refills | Status: AC

## 2023-09-16 MED ORDER — EZETIMIBE 10 MG PO TABS: 10.0000 mg | ORAL_TABLET | Freq: Every day | ORAL | 3 refills | Status: AC

## 2023-09-16 MED ORDER — ALLOPURINOL 300 MG PO TABS: 300.0000 mg | ORAL_TABLET | Freq: Every day | ORAL | 3 refills | Status: AC

## 2023-09-22 ENCOUNTER — Other Ambulatory Visit: Payer: Self-pay | Admitting: Internal Medicine

## 2023-10-21 ENCOUNTER — Encounter: Payer: Self-pay | Admitting: Gastroenterology

## 2023-10-21 ENCOUNTER — Ambulatory Visit: Admitting: Gastroenterology

## 2023-10-21 VITALS — BP 112/64 | HR 51 | Ht 65.0 in | Wt 176.0 lb

## 2023-10-21 DIAGNOSIS — Z860101 Personal history of adenomatous and serrated colon polyps: Secondary | ICD-10-CM

## 2023-10-21 DIAGNOSIS — K219 Gastro-esophageal reflux disease without esophagitis: Secondary | ICD-10-CM | POA: Diagnosis not present

## 2023-10-21 DIAGNOSIS — C61 Malignant neoplasm of prostate: Secondary | ICD-10-CM

## 2023-10-21 DIAGNOSIS — K5900 Constipation, unspecified: Secondary | ICD-10-CM

## 2023-10-21 DIAGNOSIS — R159 Full incontinence of feces: Secondary | ICD-10-CM | POA: Diagnosis not present

## 2023-10-21 DIAGNOSIS — Z860100 Personal history of colon polyps, unspecified: Secondary | ICD-10-CM

## 2023-10-21 DIAGNOSIS — R194 Change in bowel habit: Secondary | ICD-10-CM

## 2023-10-21 DIAGNOSIS — I25810 Atherosclerosis of coronary artery bypass graft(s) without angina pectoris: Secondary | ICD-10-CM

## 2023-10-21 MED ORDER — NA SULFATE-K SULFATE-MG SULF 17.5-3.13-1.6 GM/177ML PO SOLN: 1.0000 | Freq: Once | ORAL | 0 refills | Status: AC

## 2023-10-21 NOTE — Patient Instructions (Signed)
 You have been scheduled for a colonoscopy. Please follow written instructions given to you at your visit today.   If you use inhalers (even only as needed), please bring them with you on the day of your procedure.  DO NOT TAKE 7 DAYS PRIOR TO TEST- Trulicity (dulaglutide) Ozempic, Wegovy (semaglutide) Mounjaro (tirzepatide) Bydureon Bcise (exanatide extended release)  DO NOT TAKE 1 DAY PRIOR TO YOUR TEST Rybelsus (semaglutide) Adlyxin (lixisenatide) Victoza (liraglutide) Byetta (exanatide) ___________________________________________________________________________  _______________________________________________________  If your blood pressure at your visit was 140/90 or greater, please contact your primary care physician to follow up on this.  _______________________________________________________  If you are age 60 or older, your body mass index should be between 23-30. Your Body mass index is 29.29 kg/m. If this is out of the aforementioned range listed, please consider follow up with your Primary Care Provider.  If you are age 84 or younger, your body mass index should be between 19-25. Your Body mass index is 29.29 kg/m. If this is out of the aformentioned range listed, please consider follow up with your Primary Care Provider.   ________________________________________________________  The [facility] providers would like to encourage you to use MYCHART to communicate with providers for non-urgent requests or questions.  Due to long hold times on the telephone, sending your provider a message by [facility] may be a faster and more efficient way to get a response.  Please allow 48 business hours for a response.  Please remember that this is for non-urgent requests.  _______________________________________________________

## 2023-10-21 NOTE — Progress Notes (Signed)
 Noted

## 2023-10-21 NOTE — Progress Notes (Signed)
 Chief Complaint: GERD and change in bowel habits Primary GI MD: Dr. Elvin Hammer  HPI:  Discussed the use of AI scribe software for clinical note transcription with the patient, who gave verbal consent to proceed.  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  He experiences episodic worsening of reflux and heartburn symptoms, with a persistent acid taste in his mouth, especially at night, despite taking Lansoprazole 30 mg twice daily and Famotidine once daily. While the medication controls heartburn, it does not alleviate the acid taste. He avoids tomato-based foods and ensures a 4-6 hour gap between eating and lying down. He drinks herbal tea at night to curb hunger.  He has a history of chronic gastritis and esophagitis, with a previous endoscopy over 20 years ago showing these conditions. No difficulty swallowing is reported.  Over the past three months, he has experienced alternating severe constipation and loose stools with fecal incontinence. He previously used Miralax, which provided relief, but has not used it recently. He describes episodes of severe constipation with large stools and fecal leakage, requiring the use of pads. The fecal leakage was significant three weeks ago but has since decreased. He experiences urgency with bowel movements.  His last colonoscopy in 2018 revealed two small precancerous polyps, with a recommendation to return in five years. He has a history of prostate cancer and uses pads for fecal leakage.   PREVIOUS GI WORKUP   Colonoscopy 10/2016 - Two 2 to 4 mm polyps (tubular adenoma) in the descending colon and in the transverse colon, removed with a cold snare. Resected and retrieved.  - Diverticulosis in the left colon.  - The examination was otherwise normal on direct and retroflexion views. - repeat 5  years  Past Medical History:  Diagnosis Date   Anemia associated with chronic renal failure    Anticoagulated    plavix /  asa--- managed by cardiology   Aortic stenosis, moderate    followed by dr h. Felipe Horton---  last echo 08/ 2023  valve area 0.887cm^2,  mean grandient 18.10mmHg   Chronic gout    followed by pcp   (04-12-2022  per pt many yrs since last flare-up)   CKD (chronic kidney disease), stage III [facility])    nephrologist--- dr Irene Mannheim   Coronary artery disease 05/2021   cardiologist--- dr h. Felipe Horton;   per cath 06-09-2021  moderate LAD and Diagnoal disease   GERD (gastroesophageal reflux disease)    History of basal cell carcinoma (BCC) excision 2007   nose   History of chronic bronchitis    History of hepatitis A 1970   History of seizure 04/1999   per pt only one time---  unknown etiology---   none since   Hyperlipidemia    Hypertension    Hypogonadism in male    Hypothyroidism due to Hashimoto's thyroiditis    endocrinologist--- dr Ned Balint   Malignant neoplasm prostate [facility]) 02/2020   urologist--- dr Rozanne Corners;   dx 10/ 2021, gleason 7;   05-05-2020  s/p prostatectomy   OA (osteoarthritis)    OSA on CPAP 2017   per pt uses nightly  (study in epic 11-23-2015  mild osa   PAC (premature atrial contraction)    Secondary hyperparathyroidism of renal origin (HCC)    Type 2 diabetes mellitus (HCC)    followed by dr Ned Balint    (04-12-2022  per  pt does not check blood sugar)   Umbilical hernia    Wears glasses    Wears hearing aid in both ears     Past Surgical History:  Procedure Laterality Date   COLONOSCOPY     COLONOSCOPY WITH PROPOFOL   2018   dr Elvin Hammer   FACIAL FRACTURE SURGERY  1965   per pt no hardware   KNEE ARTHROSCOPY Left 09/26/2013   Procedure: LEFT KNEE ARTHROSCOPY WITH DEBRIDEMENT/SHAVING (CHONDROPLASTY), SUTURE REPAIR QUADRICEPS/HAMSTRING MUSCLE RUPTURE PRIMARY;  Surgeon: Genevie Kerns, MD;  Location: [facility];  Service: Orthopedics;   Laterality: Left;   KNEE ARTHROSCOPY Left 1999   LEFT HEART CATH AND CORONARY ANGIOGRAPHY N/A 06/09/2021   Procedure: LEFT HEART CATH AND CORONARY ANGIOGRAPHY;  Surgeon: Millicent Ally, MD;  Location: [facility];  Service: Cardiovascular;  Laterality: N/A;   LYMPHADENECTOMY Bilateral 05/05/2020   Procedure: LYMPHADENECTOMY, PELVIC;  Surgeon: Florencio Hunting, MD;  Location: [facility];  Service: Urology;  Laterality: Bilateral;   QUADRICEPS TENDON REPAIR Left 09/26/2013   Procedure: REPAIR QUADRICEP TENDON;  Surgeon: Genevie Kerns, MD;  Location: [facility];  Service: Orthopedics;  Laterality: Left;   ROBOT ASSISTED LAPAROSCOPIC RADICAL PROSTATECTOMY N/A 05/05/2020   Procedure: XI ROBOTIC ASSISTED LAPAROSCOPIC RADICAL PROSTATECTOMY LEVEL 2;  Surgeon: Florencio Hunting, MD;  Location: [facility];  Service: Urology;  Laterality: N/A;   TONSILLECTOMY     child   UMBILICAL HERNIA REPAIR N/A 04/15/2022   Procedure: OPEN UMBILICAL HERNIA REPAIR WITH MESH PATCH;  Surgeon: Oralee Billow, MD;  Location: [facility];  Service: General;  Laterality: N/A;  60 MINUTES LMA    Current Outpatient Medications  Medication Sig Dispense Refill   Accu-Chek FastClix Lancets MISC Apply topically.     acetaminophen  (TYLENOL ) 500 MG tablet Take 1,000 mg by mouth at bedtime.      albuterol  (VENTOLIN  HFA) 108 (90 Base) MCG/ACT inhaler Inhale 2 puffs into the lungs every 6 (six) hours as needed for wheezing or shortness of breath.     allopurinol  (ZYLOPRIM ) 300 MG tablet TAKE 1 TABLET BY MOUTH EVERY DAY 90 tablet 3   allopurinol  (ZYLOPRIM ) 300 MG tablet Take 1 tablet (300 mg total) by mouth daily. 90 tablet 3   aspirin  EC 81 MG tablet Take 81 mg by mouth daily. One tablet by mouth daily     atorvastatin  (LIPITOR) 80 MG tablet Take 1 tablet (80 mg total) by mouth daily. 90 tablet 3   azelastine  (ASTELIN ) 0.1 % nasal spray 1-2 puffs each nostril once or twice daily as needed (Patient taking  differently: Place 2 sprays into both nostrils as needed. 1-2 puffs each nostril once or twice daily as needed) 30 mL 12   bacitracin  ointment Apply 1 application topically 2 (two) times daily. 14 g 0   BD PEN NEEDLE NANO 2ND GEN 32G X 4 MM MISC USE AS DIRECTED 100 each 0   Blood Glucose Monitoring Suppl (FIFTY50 GLUCOSE METER 2.0) w/Device KIT See admin instructions.     Cholecalciferol  1.25 MG (50000 UT) capsule Take 1 capsule (50,000 Units total) by mouth once a week. 12 capsule 3   clopidogrel  (PLAVIX ) 75 MG tablet Take 1 tablet (75 mg total) by mouth daily. 90 tablet 3   ezetimibe  (ZETIA ) 10 MG tablet TAKE 1 TABLET BY MOUTH EVERY DAY 90 tablet 3   ezetimibe  (ZETIA ) 10 MG tablet Take 1 tablet (10 mg total) by mouth daily. 90 tablet 3  famotidine (PEPCID) 20 MG tablet Take 20 mg by mouth at bedtime.     hydrOXYzine  (ATARAX ) 25 MG tablet Take 1 tablet (25 mg total) by mouth at bedtime. (Patient taking differently: Take 25 mg by mouth at bedtime as needed.) 90 tablet 3   JANUMET  50-500 MG tablet TAKE 2 TABLETS BY MOUTH EVERY DAY 60 tablet 0   ketoconazole  (NIZORAL ) 2 % cream Apply 1 Application topically daily as needed for irritation. 15 g 3   lansoprazole (PREVACID) 30 MG capsule Take 30 mg by mouth 2 (two) times daily before a meal.     loratadine (CLARITIN) 10 MG tablet Take 10 mg by mouth as needed.     metoprolol  succinate (TOPROL -XL) 25 MG 24 hr tablet Take 0.5 tablets (12.5 mg total) by mouth daily. 90 tablet 2   Multiple Vitamins-Minerals (MULTIVITAMIN ADULTS) TABS Take 1 tablet by mouth at bedtime.     Na Sulfate-K Sulfate-Mg Sulfate concentrate (SUPREP) 17.5-3.13-1.6 GM/177ML SOLN Take 1 kit (354 mLs total) by mouth once for 1 dose. 354 mL 0   simethicone (MYLICON) 125 MG chewable tablet Chew 375 mg by mouth at bedtime. Take 3 tablets (375 mg) at bedtime     SYNTHROID  137 MCG tablet Take 1 tablet (137 mcg total) by mouth daily before breakfast. 90 tablet 3    telmisartan -hydrochlorothiazide  (MICARDIS  HCT) 40-12.5 MG tablet TAKE 1/2 TABLET BY MOUTH DAILY. PLEASE DISCARD REMAINING PORTION OF PILL. 90 tablet 3   telmisartan -hydrochlorothiazide  (MICARDIS  HCT) 40-12.5 MG tablet Take 0.5 tablets by mouth daily. Please discard remaining portion of pill. 90 tablet 3   traZODone  (DESYREL ) 50 MG tablet Take 1 tablet (50 mg total) by mouth at bedtime. 90 tablet 3   UNIFINE PENTIPS 32G X 4 MM MISC      VASCEPA  1 g capsule Take 2 capsules (2 g total) by mouth at bedtime. 180 capsule 3   VICTOZA  18 MG/3ML SOPN INJECT 1.8 MG UNDER THE SKIN ONCE DAILY 9 mL 3   calcium  carbonate (TUMS EX) 750 MG chewable tablet Chew 2 tablets by mouth at bedtime as needed. (Patient not taking: Reported on 10/21/2023)     nitroGLYCERIN  (NITROSTAT ) 0.4 MG SL tablet Place 1 tablet (0.4 mg total) under the tongue every 5 (five) minutes as needed for chest pain. 25 tablet 3   No current facility-administered medications for this visit.    Allergies as of 10/21/2023 - Review Complete 10/21/2023  Allergen Reaction Noted   Exenatide Other (See Comments) 10/22/2020   Semaglutide Other (See Comments) 10/22/2020   Amlodipine  10/22/2020   Atenolol  10/22/2020   Lisinopril Hypertension 11/02/2016   Nsaids Other (See Comments) 09/13/2007   Ranitidine hcl Itching and Other (See Comments) 09/13/2007    Family History  Problem Relation Age of Onset   Breast cancer Mother    Heart disease Mother    Heart disease Father    Breast cancer Sister    Hypertension Other    Diabetes Other    Esophageal cancer Neg Hx    Rectal cancer Neg Hx    Stomach cancer Neg Hx    Prostate cancer Neg Hx    Pancreatic cancer Neg Hx    Colon cancer Neg Hx     Social History   Socioeconomic History   Marital status: Married    Spouse name: Donterius Filley, RN    Number of children: 2   Years of education: Not on file   Highest education level: Not on file  Occupational History    Comment: pediatric  and adult endocrinologist  Tobacco Use   Smoking status: Former    Current packs/day: 0.00    Average packs/day: 0.5 packs/day for 19.0 years (9.5 ttl pk-yrs)    Types: Cigarettes    Start date: 09/21/1953    Quit date: 09/21/1972    Years since quitting: 51.1   Smokeless tobacco: Never  Vaping Use   Vaping status: Never Used  Substance and Sexual Activity   Alcohol use: No    Alcohol/week: 14.0 standard drinks of alcohol    Types: 7 Glasses of wine, 7 Shots of liquor per week   Drug use: Never   Sexual activity: Not on file  Other Topics Concern   Not on file  Social History Narrative   Not on file   Social Drivers of Health   Financial Resource Strain: Low Risk  (11/26/2022)   Overall Financial Resource Strain (CARDIA)    Difficulty of Paying Living Expenses: Not hard at all  Food Insecurity: No Food Insecurity (11/26/2022)   Hunger Vital Sign    Worried About Running Out of Food in the Last Year: Never true    Ran Out of Food in the Last Year: Never true  Transportation Needs: No Transportation Needs (11/26/2022)   PRAPARE - Administrator, Civil Service (Medical): No    Lack of Transportation (Non-Medical): No  Physical Activity: Sufficiently Active (11/26/2022)   Exercise Vital Sign    Days of Exercise per Week: 7 days    Minutes of Exercise per Session: 40 min  Stress: No Stress Concern Present (11/26/2022)   [employer] of Occupational Health - Occupational Stress Questionnaire    Feeling of Stress : Only a little  Social Connections: Moderately Integrated (11/26/2022)   Social Connection and Isolation Panel [NHANES]    Frequency of Communication with Friends and Family: More than three times a week    Frequency of Social Gatherings with Friends and Family: Once a week    Attends Religious Services: Never    Database administrator or Organizations: Yes    Attends Banker Meetings: 1 to 4 times per year    Marital Status: Married  Careers information officer Violence: Not At Risk (11/26/2022)   Humiliation, Afraid, Rape, and Kick questionnaire    Fear of Current or Ex-Partner: No    Emotionally Abused: No    Physically Abused: No    Sexually Abused: No    Review of Systems:    Constitutional: No weight loss, fever, chills, weakness or fatigue HEENT: Eyes: No change in vision               Ears, Nose, Throat:  No change in hearing or congestion Skin: No rash or itching Cardiovascular: No chest pain, chest pressure or palpitations   Respiratory: No SOB or cough Gastrointestinal: See HPI and otherwise negative Genitourinary: No dysuria or change in urinary frequency Neurological: No headache, dizziness or syncope Musculoskeletal: No new muscle or joint pain Hematologic: No bleeding or bruising Psychiatric: No history of depression or anxiety    Physical Exam:  Vital signs: BP 112/64   Pulse (!) 51   Ht 5\' 5"  (1.651 m)   Wt 176 lb (79.8 kg)   BMI 29.29 kg/m   Constitutional: NAD, alert and cooperative.  Appears significantly younger than stated age Head:  Normocephalic and atraumatic. Eyes:   PEERL, EOMI. No icterus. Conjunctiva pink. Respiratory: Respirations even and unlabored. Lungs  clear to auscultation bilaterally.   No wheezes, crackles, or rhonchi.  Cardiovascular:  Regular rate and rhythm. No peripheral edema, cyanosis or pallor.  Gastrointestinal:  Soft, nondistended, nontender. No rebound or guarding. Normal bowel sounds. No appreciable masses or hepatomegaly. Rectal:  Declines Msk:  Symmetrical without gross deformities. Without edema, no deformity or joint abnormality.  Neurologic:  Alert and  oriented x4;  grossly normal neurologically.  Skin:   Dry and intact without significant lesions or rashes. Psychiatric: Oriented to person, place and time. Demonstrates good judgement and reason without abnormal affect or behaviors.   RELEVANT LABS AND IMAGING: CBC    Component Value Date/Time   WBC 8.2 06/05/2021  0925   WBC 7.5 05/24/2021 1158   RBC 5.15 06/05/2021 0925   RBC 4.99 05/24/2021 1158   HGB 16.0 04/15/2022 0816   HGB 16.1 06/05/2021 0925   HCT 47.0 04/15/2022 0816   HCT 46.5 06/05/2021 0925   PLT 241 06/05/2021 0925   MCV 90 06/05/2021 0925   MCH 31.3 06/05/2021 0925   MCH 31.1 05/24/2021 1158   MCHC 34.6 06/05/2021 0925   MCHC 33.0 05/24/2021 1158   RDW 13.6 06/05/2021 0925   LYMPHSABS 1,774 04/15/2021 0820   MONOABS 0.5 06/01/2011 2130   EOSABS 380 04/15/2021 0820   BASOSABS 51 04/15/2021 0820    CMP     Component Value Date/Time   NA 143 04/15/2022 0816   NA 143 06/29/2021 0849   K 4.1 04/15/2022 0816   CL 105 04/15/2022 0816   CO2 23 06/29/2021 0849   GLUCOSE 99 04/15/2022 0816   BUN 40 (H) 04/15/2022 0816   BUN 35 (H) 06/29/2021 0849   CREATININE 1.80 (H) 04/15/2022 0816   CREATININE 1.72 (H) 04/15/2021 0820   CALCIUM  9.5 06/29/2021 0849   PROT 6.4 06/29/2021 0849   ALBUMIN 4.4 06/29/2021 0849   AST 28 06/29/2021 0849   ALT 32 06/29/2021 0849   ALKPHOS 99 06/29/2021 0849   BILITOT 0.3 06/29/2021 0849   GFRNONAA 39 (L) 05/24/2021 1158   GFRAA 41 (L) 09/25/2013 1500     Assessment/Plan:   78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  Previous relief with MiraLAX.  Last colonoscopy in 2018 with 2 small tubular adenomas.  No weight loss or rectal bleeding. - Recommend starting fiber supplement (Benefiber) 1-2 times daily - Can add in MiraLAX as needed and adjust dose based on response -- if persistent symptoms, consider pelvic floor physical therapy. - Schedule  colonoscopy for change in bowel habits and surveillance.  Patient appears significantly younger than stated age and has adequate EF on recent echo - I thoroughly discussed the procedure with the patient (at bedside) to include nature of the procedure, alternatives, benefits, and risks (including but not limited to bleeding, infection, perforation, anesthesia/cardiac pulmonary complications).  Patient verbalized understanding and gave verbal consent to proceed with procedure.    GERD Longstanding history of persistent GERD with EGD reported in 2002 that showed gastritis and esophagitis.  Heartburn controlled on lansoprazole 30 Mg twice daily and famotidine once daily but continued to have acid taste in his mouth and  he would like EGD to evaluate for Barrett's - EGD for further evaluation - Risks versus benefits discussed - can consider voquezna if refractory symptoms despite antacid regimen, will await findings on EGD  CAD On clopidogrel . Moderate LAD diagonal CAD per 2023 cath. ECHO 12/2021 with mild to moderate aortic stenosis, EF 55-60% -- Will hold plavix   5 days prior to endoscopic procedures - will instruct when and how to resume after procedure. Benefits and risks of procedure explained including risks of bleeding, perforation, infection, missed lesions, reactions to medications and possible need for hospitalization and surgery for complications. Additional rare but real risk of stroke or other vascular clotting events off plavix  also explained and need to seek urgent help if any signs of these problems occur. Will communicate by phone or EMR with patient's  prescribing provider to confirm that holding plavix  is reasonable in this case.    Type II diabetes  History of prostate cancer s/p prostatectomy 2021  Suzanna Erp, [place] [facility] 10/21/2023, 2:59 PM  Cc: Sylvan Evener, MD

## 2023-11-04 DIAGNOSIS — I129 Hypertensive chronic kidney disease with stage 1 through stage 4 chronic kidney disease, or unspecified chronic kidney disease: Secondary | ICD-10-CM | POA: Diagnosis not present

## 2023-11-04 DIAGNOSIS — E785 Hyperlipidemia, unspecified: Secondary | ICD-10-CM | POA: Diagnosis not present

## 2023-11-04 DIAGNOSIS — E1122 Type 2 diabetes mellitus with diabetic chronic kidney disease: Secondary | ICD-10-CM | POA: Diagnosis not present

## 2023-11-04 DIAGNOSIS — R1032 Left lower quadrant pain: Secondary | ICD-10-CM | POA: Diagnosis not present

## 2023-11-04 DIAGNOSIS — K760 Fatty (change of) liver, not elsewhere classified: Secondary | ICD-10-CM | POA: Diagnosis not present

## 2023-11-04 DIAGNOSIS — I2510 Atherosclerotic heart disease of native coronary artery without angina pectoris: Secondary | ICD-10-CM | POA: Diagnosis not present

## 2023-11-04 DIAGNOSIS — N189 Chronic kidney disease, unspecified: Secondary | ICD-10-CM | POA: Diagnosis not present

## 2023-11-04 DIAGNOSIS — Z8639 Personal history of other endocrine, nutritional and metabolic disease: Secondary | ICD-10-CM | POA: Diagnosis not present

## 2023-11-09 ENCOUNTER — Telehealth: Payer: Self-pay

## 2023-11-09 ENCOUNTER — Other Ambulatory Visit: Payer: Self-pay | Admitting: Internal Medicine

## 2023-11-09 NOTE — Telephone Encounter (Signed)
 Spoke to patient and told him [facility]  for 5 days prior to his procedure.  Patient agreed

## 2023-11-28 ENCOUNTER — Telehealth: Payer: Self-pay | Admitting: Internal Medicine

## 2023-11-28 MED ORDER — NA SULFATE-K SULFATE-MG SULF 17.5-3.13-1.6 GM/177ML PO SOLN
1.0000 | Freq: Once | ORAL | 0 refills | Status: AC
Start: 2023-11-28 — End: 2023-11-28

## 2023-11-28 NOTE — Telephone Encounter (Signed)
 Inbound call from patient stated he has an Endo/Colon coming up on July 25th with Dr.Perry and hasn't received prep medication for these procedures would like it sent to [facility] in [place]  Please advise Thank you

## 2023-11-28 NOTE — Telephone Encounter (Signed)
[facility] to [facility] as requested

## 2023-12-09 ENCOUNTER — Ambulatory Visit: Payer: Self-pay | Admitting: Internal Medicine

## 2023-12-09 ENCOUNTER — Encounter: Payer: Self-pay | Admitting: Internal Medicine

## 2023-12-09 VITALS — BP 135/73 | HR 70 | Temp 97.7°F | Resp 18 | Ht 65.0 in | Wt 176.0 lb

## 2023-12-09 DIAGNOSIS — Z1211 Encounter for screening for malignant neoplasm of colon: Secondary | ICD-10-CM

## 2023-12-09 DIAGNOSIS — K219 Gastro-esophageal reflux disease without esophagitis: Secondary | ICD-10-CM

## 2023-12-09 DIAGNOSIS — K449 Diaphragmatic hernia without obstruction or gangrene: Secondary | ICD-10-CM

## 2023-12-09 DIAGNOSIS — Z860100 Personal history of colon polyps, unspecified: Secondary | ICD-10-CM

## 2023-12-09 DIAGNOSIS — K5730 Diverticulosis of large intestine without perforation or abscess without bleeding: Secondary | ICD-10-CM

## 2023-12-09 DIAGNOSIS — D122 Benign neoplasm of ascending colon: Secondary | ICD-10-CM | POA: Diagnosis not present

## 2023-12-09 DIAGNOSIS — D123 Benign neoplasm of transverse colon: Secondary | ICD-10-CM | POA: Diagnosis not present

## 2023-12-09 DIAGNOSIS — Z860101 Personal history of adenomatous and serrated colon polyps: Secondary | ICD-10-CM

## 2023-12-09 MED ORDER — FAMOTIDINE 20 MG PO TABS: 40.0000 mg | ORAL_TABLET | Freq: Every day | ORAL | 3 refills | Status: AC

## 2023-12-09 MED ORDER — SODIUM CHLORIDE 0.9 % IV SOLN: 500.0000 mL | INTRAVENOUS | Status: AC

## 2023-12-09 MED ORDER — LANSOPRAZOLE 30 MG PO CPDR: 30.0000 mg | DELAYED_RELEASE_CAPSULE | Freq: Two times a day (BID) | ORAL | 3 refills | Status: AC

## 2023-12-09 NOTE — Op Note (Signed)
 [facility] Patient Name: [name] Procedure Date: 12/09/2023 11:59 AM MRN: [id number] Endoscopist: Norleen SAILOR. Abran , MD, [id number] Age: 78 Referring MD:  Date of Birth: June 27, 1945 Gender: Male Account #: [id number] Procedure:                Upper GI endoscopy Indications:              Esophageal reflux. Persistent symptoms despite                            twice daily lansoprazole and at bedtime famotidine Medicines:                Monitored Anesthesia Care Procedure:                Pre-Anesthesia Assessment:                           - Prior to the procedure, a History and Physical                            was performed, and patient medications and                            allergies were reviewed. The patient's tolerance of                            previous anesthesia was also reviewed. The risks                            and benefits of the procedure and the sedation                            options and risks were discussed with the patient.                            All questions were answered, and informed consent                            was obtained. Prior Anticoagulants: The patient has                            taken Plavix  (clopidogrel ), last dose was 1 day                            prior to procedure. ASA Grade Assessment: III - A                            patient with severe systemic disease. After                            reviewing the risks and benefits, the patient was                            deemed in satisfactory condition to undergo the  procedure.                           After obtaining informed consent, the endoscope was                            passed under direct vision. Throughout the                            procedure, the patient's blood pressure, pulse, and                            oxygen  saturations were monitored continuously. The                            GIF [id number] #[id number] was  introduced through the                            mouth, and advanced to the third part of duodenum.                            The upper GI endoscopy was accomplished without                            difficulty. The patient tolerated the procedure                            well. Scope In: Scope Out: Findings:                 The esophagus was normal. No active inflammation.                            No Barrett's.                           The stomach was normal. Small hiatal hernia.                           The examined duodenum was normal.                           The cardia and gastric fundus were normal on                            retroflexion. Complications:            No immediate complications. Estimated Blood Loss:     Estimated blood loss: none. Impression:               - Normal esophagus. No inflammation or Barrett's.                           - Normal stomach with small hiatal hernia.                           - Normal examined duodenum.                           -  No specimens collected. Recommendation:           - Patient has a contact number available for                            emergencies. The signs and symptoms of potential                            delayed complications were discussed with the                            patient. Return to normal activities tomorrow.                            Written discharge instructions were provided to the                            patient.                           - Resume previous diet.                           - Continue present medications, including Plavix .                           - Confirmed that you are taking lansoprazole 30                            minutes before breakfast and 30 minutes before the                            evening meal. Also, increase famotidine to 40 mg,                            30 minutes prior to bedtime Alaija Ruble N. Abran, MD 12/09/2023 12:50:13 PM This report has been signed  electronically.

## 2023-12-09 NOTE — Progress Notes (Signed)
 Called to room to assist during endoscopic procedure.  Patient ID and intended procedure confirmed with present staff. Received instructions for my participation in the procedure from the performing physician.

## 2023-12-09 NOTE — Progress Notes (Signed)
 Sedate, gd SR, tolerated procedure well, VSS, report to RN

## 2023-12-09 NOTE — Progress Notes (Signed)
 Expand All Collapse All     Chief Complaint: GERD and change in bowel habits Primary GI MD: Dr. Abran   HPI:   Discussed the use of AI scribe software for clinical note transcription with the patient, who gave verbal consent to proceed.   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   He experiences episodic worsening of reflux and heartburn symptoms, with a persistent acid taste in his mouth, especially at night, despite taking Lansoprazole 30 mg twice daily and Famotidine once daily. While the medication controls heartburn, it does not alleviate the acid taste. He avoids tomato-based foods and ensures a 4-6 hour gap between eating and lying down. He drinks herbal tea at night to curb hunger.   He has a history of chronic gastritis and esophagitis, with a previous endoscopy over 20 years ago showing these conditions. No difficulty swallowing is reported.   Over the past three months, he has experienced alternating severe constipation and loose stools with fecal incontinence. He previously used Miralax, which provided relief, but has not used it recently. He describes episodes of severe constipation with large stools and fecal leakage, requiring the use of pads. The fecal leakage was significant three weeks ago but has since decreased. He experiences urgency with bowel movements.   His last colonoscopy in 2018 revealed two small precancerous polyps, with a recommendation to return in five years. He has a history of prostate cancer and uses pads for fecal leakage.     PREVIOUS GI WORKUP    Colonoscopy 10/2016 - Two 2 to 4 mm polyps (tubular adenoma) in the descending colon and in the transverse colon, removed with a cold snare. Resected and retrieved.  - Diverticulosis in the left colon.  - The examination was otherwise normal on direct  and retroflexion views. - repeat 5 years       Past Medical History:  Diagnosis Date   Anemia associated with chronic renal failure     Anticoagulated      plavix /  asa--- managed by cardiology   Aortic stenosis, moderate      followed by dr [name]---  last echo 08/ 2023  valve area 0.887cm^2,  mean grandient 18.77mmHg   Chronic gout      followed by pcp   (04-12-2022  per pt many yrs since last flare-up)   CKD (chronic kidney disease), stage III [facility])      nephrologist--- dr [name]   Coronary artery disease 05/2021    cardiologist--- dr [name];   per cath 06-09-2021  moderate LAD and Diagnoal disease   GERD (gastroesophageal reflux disease)     History of basal cell carcinoma (BCC) excision 2007    nose   History of chronic bronchitis     History of hepatitis A 1970   History of seizure 04/1999    per pt only one time---  unknown etiology---   none since   Hyperlipidemia     Hypertension     Hypogonadism in male     Hypothyroidism due to Hashimoto's thyroiditis      endocrinologist--- dr [name]   Malignant neoplasm prostate [facility]) 02/2020    urologist--- dr [name];   dx 10/ 2021, gleason 7;   05-05-2020  s/p prostatectomy   OA (osteoarthritis)     OSA on CPAP 2017    per pt uses nightly  (  study in epic 11-23-2015  mild osa   PAC (premature atrial contraction)     Secondary hyperparathyroidism of renal origin (HCC)     Type 2 diabetes mellitus (HCC)      followed by dr [name]    (04-12-2022  per pt does not check blood sugar)   Umbilical hernia     Wears glasses     Wears hearing aid in both ears                 Past Surgical History:  Procedure Laterality Date   COLONOSCOPY       COLONOSCOPY WITH PROPOFOL    2018    dr [name]   FACIAL FRACTURE SURGERY   1965    per pt no hardware   KNEE ARTHROSCOPY Left 09/26/2013    Procedure: LEFT KNEE ARTHROSCOPY WITH DEBRIDEMENT/SHAVING (CHONDROPLASTY), SUTURE REPAIR QUADRICEPS/HAMSTRING MUSCLE RUPTURE PRIMARY;  Surgeon:  Peter Peter Millman, MD;  Location: [facility];  Service: Orthopedics;  Laterality: Left;   KNEE ARTHROSCOPY Left 1999   LEFT HEART CATH AND CORONARY ANGIOGRAPHY N/A 06/09/2021    Procedure: LEFT HEART CATH AND CORONARY ANGIOGRAPHY;  Surgeon: Peter Debby DELENA, MD;  Location: [facility];  Service: Cardiovascular;  Laterality: N/A;   LYMPHADENECTOMY Bilateral 05/05/2020    Procedure: LYMPHADENECTOMY, PELVIC;  Surgeon: [name] Glance, MD;  Location: [facility];  Service: Urology;  Laterality: Bilateral;   QUADRICEPS TENDON REPAIR Left 09/26/2013    Procedure: REPAIR QUADRICEP TENDON;  Surgeon: Peter Peter Millman, MD;  Location: [facility];  Service: Orthopedics;  Laterality: Left;   ROBOT ASSISTED LAPAROSCOPIC RADICAL PROSTATECTOMY N/A 05/05/2020    Procedure: XI ROBOTIC ASSISTED LAPAROSCOPIC RADICAL PROSTATECTOMY LEVEL 2;  Surgeon: [name] Glance, MD;  Location: [facility];  Service: Urology;  Laterality: N/A;   TONSILLECTOMY        child   UMBILICAL HERNIA REPAIR N/A 04/15/2022    Procedure: OPEN UMBILICAL HERNIA REPAIR WITH MESH PATCH;  Surgeon: Peter Boas, MD;  Location: [facility];  Service: General;  Laterality: N/A;  60 MINUTES LMA                Current Outpatient Medications  Medication Sig Dispense Refill   Accu-Chek FastClix Lancets MISC Apply topically.       acetaminophen  (TYLENOL ) 500 MG tablet Take 1,000 mg by mouth at bedtime.        albuterol  (VENTOLIN  HFA) 108 (90 Base) MCG/ACT inhaler Inhale 2 puffs into the lungs every 6 (six) hours as needed for wheezing or shortness of breath.       allopurinol  (ZYLOPRIM ) 300 MG tablet TAKE 1 TABLET BY MOUTH EVERY DAY 90 tablet 3   allopurinol  (ZYLOPRIM ) 300 MG tablet Take 1 tablet (300 mg total) by mouth daily. 90 tablet 3   aspirin  EC 81 MG tablet Take 81 mg by mouth daily. One tablet by mouth daily       atorvastatin  (LIPITOR) 80 MG tablet Take 1 tablet (80 mg total) by mouth daily. 90 tablet 3    azelastine  (ASTELIN ) 0.1 % nasal spray 1-2 puffs each nostril once or twice daily as needed (Patient taking differently: Place 2 sprays into both nostrils as needed. 1-2 puffs each nostril once or twice daily as needed) 30 mL 12   bacitracin  ointment Apply 1 application topically 2 (two) times daily. 14 g 0   BD PEN NEEDLE NANO 2ND GEN 32G X 4 MM MISC USE AS DIRECTED 100 each 0  Blood Glucose Monitoring Suppl (FIFTY50 GLUCOSE METER 2.0) w/Device KIT See admin instructions.       Cholecalciferol  1.25 MG (50000 UT) capsule Take 1 capsule (50,000 Units total) by mouth once a week. 12 capsule 3   clopidogrel  (PLAVIX ) 75 MG tablet Take 1 tablet (75 mg total) by mouth daily. 90 tablet 3   ezetimibe  (ZETIA ) 10 MG tablet TAKE 1 TABLET BY MOUTH EVERY DAY 90 tablet 3   ezetimibe  (ZETIA ) 10 MG tablet Take 1 tablet (10 mg total) by mouth daily. 90 tablet 3   famotidine (PEPCID) 20 MG tablet Take 20 mg by mouth at bedtime.       hydrOXYzine  (ATARAX ) 25 MG tablet Take 1 tablet (25 mg total) by mouth at bedtime. (Patient taking differently: Take 25 mg by mouth at bedtime as needed.) 90 tablet 3   JANUMET  50-500 MG tablet TAKE 2 TABLETS BY MOUTH EVERY DAY 60 tablet 0   ketoconazole  (NIZORAL ) 2 % cream Apply 1 Application topically daily as needed for irritation. 15 g 3   lansoprazole (PREVACID) 30 MG capsule Take 30 mg by mouth 2 (two) times daily before a meal.       loratadine (CLARITIN) 10 MG tablet Take 10 mg by mouth as needed.       metoprolol  succinate (TOPROL -XL) 25 MG 24 hr tablet Take 0.5 tablets (12.5 mg total) by mouth daily. 90 tablet 2   Multiple Vitamins-Minerals (MULTIVITAMIN ADULTS) TABS Take 1 tablet by mouth at bedtime.       Na Sulfate-K Sulfate-Mg Sulfate concentrate (SUPREP) 17.5-3.13-1.6 GM/177ML SOLN Take 1 kit (354 mLs total) by mouth once for 1 dose. 354 mL 0   simethicone (MYLICON) 125 MG chewable tablet Chew 375 mg by mouth at bedtime. Take 3 tablets (375 mg) at bedtime        SYNTHROID  137 MCG tablet Take 1 tablet (137 mcg total) by mouth daily before breakfast. 90 tablet 3   telmisartan -hydrochlorothiazide  (MICARDIS  HCT) 40-12.5 MG tablet TAKE 1/2 TABLET BY MOUTH DAILY. PLEASE DISCARD REMAINING PORTION OF PILL. 90 tablet 3   telmisartan -hydrochlorothiazide  (MICARDIS  HCT) 40-12.5 MG tablet Take 0.5 tablets by mouth daily. Please discard remaining portion of pill. 90 tablet 3   traZODone  (DESYREL ) 50 MG tablet Take 1 tablet (50 mg total) by mouth at bedtime. 90 tablet 3   UNIFINE PENTIPS 32G X 4 MM MISC         VASCEPA  1 g capsule Take 2 capsules (2 g total) by mouth at bedtime. 180 capsule 3   VICTOZA  18 MG/3ML SOPN INJECT 1.8 MG UNDER THE SKIN ONCE DAILY 9 mL 3   calcium  carbonate (TUMS EX) 750 MG chewable tablet Chew 2 tablets by mouth at bedtime as needed. (Patient not taking: Reported on 10/21/2023)       nitroGLYCERIN  (NITROSTAT ) 0.4 MG SL tablet Place 1 tablet (0.4 mg total) under the tongue every 5 (five) minutes as needed for chest pain. 25 tablet 3      No current facility-administered medications for this visit.             Allergies as of 10/21/2023 - Review Complete 10/21/2023  Allergen Reaction Noted   Exenatide Other (See Comments) 10/22/2020   Semaglutide Other (See Comments) 10/22/2020   Amlodipine   10/22/2020   Atenolol   10/22/2020   Lisinopril Hypertension 11/02/2016   Nsaids Other (See Comments) 09/13/2007   Ranitidine hcl Itching and Other (See Comments) 09/13/2007  Family History  Problem Relation Age of Onset   Breast cancer Mother     Heart disease Mother     Heart disease Father     Breast cancer Sister     Hypertension Other     Diabetes Other     Esophageal cancer Neg Hx     Rectal cancer Neg Hx     Stomach cancer Neg Hx     Prostate cancer Neg Hx     Pancreatic cancer Neg Hx     Colon cancer Neg Hx            Social History         Socioeconomic History   Marital status: Married      Spouse name:  Elgin Carn, RN    Number of children: 2   Years of education: Not on file   Highest education level: Not on file  Occupational History      Comment: pediatric and adult endocrinologist  Tobacco Use   Smoking status: Former      Current packs/day: 0.00      Average packs/day: 0.5 packs/day for 19.0 years (9.5 ttl pk-yrs)      Types: Cigarettes      Start date: 09/21/1953      Quit date: 09/21/1972      Years since quitting: 51.1   Smokeless tobacco: Never  Vaping Use   Vaping status: Never Used  Substance and Sexual Activity   Alcohol use: No      Alcohol/week: 14.0 standard drinks of alcohol      Types: 7 Glasses of wine, 7 Shots of liquor per week   Drug use: Never   Sexual activity: Not on file  Other Topics Concern   Not on file  Social History Narrative   Not on file    Social Drivers of Health        Financial Resource Strain: Low Risk  (11/26/2022)    Overall Financial Resource Strain (CARDIA)     Difficulty of Paying Living Expenses: Not hard at all  Food Insecurity: No Food Insecurity (11/26/2022)    Hunger Vital Sign     Worried About Running Out of Food in the Last Year: Never true     Ran Out of Food in the Last Year: Never true  Transportation Needs: No Transportation Needs (11/26/2022)    PRAPARE - Therapist, art (Medical): No     Lack of Transportation (Non-Medical): No  Physical Activity: Sufficiently Active (11/26/2022)    Exercise Vital Sign     Days of Exercise per Week: 7 days     Minutes of Exercise per Session: 40 min  Stress: No Stress Concern Present (11/26/2022)    [employer] of Occupational Health - Occupational Stress Questionnaire     Feeling of Stress : Only a little  Social Connections: Moderately Integrated (11/26/2022)    Social Connection and Isolation Panel [NHANES]     Frequency of Communication with Friends and Family: More than three times a week     Frequency of Social Gatherings with Friends and  Family: Once a week     Attends Religious Services: Never     Database administrator or Organizations: Yes     Attends Banker Meetings: 1 to 4 times per year     Marital Status: Married  Catering manager Violence: Not At Risk (11/26/2022)    Humiliation, Afraid, Rape, and Kick questionnaire  Fear of Current or Ex-Partner: No     Emotionally Abused: No     Physically Abused: No     Sexually Abused: No      Review of Systems:    Constitutional: No weight loss, fever, chills, weakness or fatigue HEENT: Eyes: No change in vision               Ears, Nose, Throat:  No change in hearing or congestion Skin: No rash or itching Cardiovascular: No chest pain, chest pressure or palpitations   Respiratory: No SOB or cough Gastrointestinal: See HPI and otherwise negative Genitourinary: No dysuria or change in urinary frequency Neurological: No headache, dizziness or syncope Musculoskeletal: No new muscle or joint pain Hematologic: No bleeding or bruising Psychiatric: No history of depression or anxiety      Physical Exam:  Vital signs: BP 112/64   Pulse (!) 51   Ht 5' 5 (1.651 m)   Wt 176 lb (79.8 kg)   BMI 29.29 kg/m    Constitutional: NAD, alert and cooperative.  Appears significantly younger than stated age Head:  Normocephalic and atraumatic. Eyes:   PEERL, EOMI. No icterus. Conjunctiva pink. Respiratory: Respirations even and unlabored. Lungs clear to auscultation bilaterally.   No wheezes, crackles, or rhonchi.  Cardiovascular:  Regular rate and rhythm. No peripheral edema, cyanosis or pallor.  Gastrointestinal:  Soft, nondistended, nontender. No rebound or guarding. Normal bowel sounds. No appreciable masses or hepatomegaly. Rectal:  Declines Msk:  Symmetrical without gross deformities. Without edema, no deformity or joint abnormality.  Neurologic:  Alert and  oriented x4;  grossly normal neurologically.  Skin:   Dry and intact without significant lesions or  rashes. Psychiatric: Oriented to person, place and time. Demonstrates good judgement and reason without abnormal affect or behaviors.     RELEVANT LABS AND IMAGING: CBC Labs (Brief)          Component Value Date/Time    WBC 8.2 06/05/2021 0925    WBC 7.5 05/24/2021 1158    RBC 5.15 06/05/2021 0925    RBC 4.99 05/24/2021 1158    HGB 16.0 04/15/2022 0816    HGB 16.1 06/05/2021 0925    HCT 47.0 04/15/2022 0816    HCT 46.5 06/05/2021 0925    PLT 241 06/05/2021 0925    MCV 90 06/05/2021 0925    MCH 31.3 06/05/2021 0925    MCH 31.1 05/24/2021 1158    MCHC 34.6 06/05/2021 0925    MCHC 33.0 05/24/2021 1158    RDW 13.6 06/05/2021 0925    LYMPHSABS 1,774 04/15/2021 0820    MONOABS 0.5 06/01/2011 2130    EOSABS 380 04/15/2021 0820    BASOSABS 51 04/15/2021 0820        CMP     Labs (Brief)          Component Value Date/Time    NA 143 04/15/2022 0816    NA 143 06/29/2021 0849    K 4.1 04/15/2022 0816    CL 105 04/15/2022 0816    CO2 23 06/29/2021 0849    GLUCOSE 99 04/15/2022 0816    BUN 40 (H) 04/15/2022 0816    BUN 35 (H) 06/29/2021 0849    CREATININE 1.80 (H) 04/15/2022 0816    CREATININE 1.72 (H) 04/15/2021 0820    CALCIUM  9.5 06/29/2021 0849    PROT 6.4 06/29/2021 0849    ALBUMIN 4.4 06/29/2021 0849    AST 28 06/29/2021 0849    ALT 32 06/29/2021 0849  ALKPHOS 99 06/29/2021 0849    BILITOT 0.3 06/29/2021 0849    GFRNONAA 39 (L) 05/24/2021 1158    GFRAA 41 (L) 09/25/2013 1500          Assessment/Plan:    78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  Previous relief  with MiraLAX.  Last colonoscopy in 2018 with 2 small tubular adenomas.  No weight loss or rectal bleeding. - Recommend starting fiber supplement (Benefiber) 1-2 times daily - Can add in MiraLAX as needed and adjust dose based on response -- if persistent symptoms, consider pelvic floor physical therapy. - Schedule colonoscopy for change in bowel habits and surveillance.  Patient appears significantly younger than stated age and has adequate EF on recent echo - I thoroughly discussed the procedure with the patient (at bedside) to include nature of the procedure, alternatives, benefits, and risks (including but not limited to bleeding, infection, perforation, anesthesia/cardiac pulmonary complications).  Patient verbalized understanding and gave verbal consent to proceed with procedure.      GERD Longstanding history of persistent GERD with EGD reported in 2002 that showed gastritis and esophagitis.  Heartburn controlled on lansoprazole 30 Mg twice daily and famotidine once daily but continued to have acid taste in his mouth and he would like EGD to evaluate for Barrett's - EGD for further evaluation - Risks versus benefits discussed - can consider voquezna if refractory symptoms despite antacid regimen, will await findings on EGD   CAD On clopidogrel . Moderate LAD diagonal CAD per 2023 cath. ECHO 12/2021 with mild to moderate aortic stenosis, EF 55-60% -- Will hold plavix   5 days prior to endoscopic procedures - will instruct when and how to resume after procedure. Benefits and risks of procedure explained including risks of bleeding, perforation, infection, missed lesions, reactions to medications and possible need for hospitalization and surgery for complications. Additional rare but real risk of stroke or other vascular clotting events off plavix  also explained and need to seek urgent help if any signs of these problems occur. Will communicate by phone or EMR with patient's  prescribing provider to  confirm that holding plavix  is reasonable in this case.     Type II diabetes   History of prostate cancer s/p prostatectomy 2021   Nestor Blower, [facility] 10/21/2023, 2:59 PM   Cc: Perri Ronal PARAS, MD    Recent H&P as above. Was in the ER with abdominal pain November 04, 2023.  Reviewed.  No acute process. Now for colonoscopy and upper endoscopy. The patient did not stop Plavix .  We will proceed with potential limitations as discussed.

## 2023-12-09 NOTE — Patient Instructions (Signed)
 Resume previous diet and medications.  Resume Plavix  today at prior dose.  Refills ordered for Lansoprazole and Famotidine.  Please take as prescribed.  Repeat colonoscopy not recommended at this time.    YOU HAD AN ENDOSCOPIC PROCEDURE TODAY AT THE [facility]:   Refer to the procedure report that was given to you for any specific questions about what was found during the examination.  If the procedure report does not answer your questions, please call your gastroenterologist to clarify.  If you requested that your care partner not be given the details of your procedure findings, then the procedure report has been included in a sealed envelope for you to review at your convenience later.  YOU SHOULD EXPECT: Some feelings of bloating in the abdomen. Passage of more gas than usual.  Walking can help get rid of the air that was put into your GI tract during the procedure and reduce the bloating. If you had a lower endoscopy (such as a colonoscopy or flexible sigmoidoscopy) you may notice spotting of blood in your stool or on the toilet paper. If you underwent a bowel prep for your procedure, you may not have a normal bowel movement for a few days.  Please Note:  You might notice some irritation and congestion in your nose or some drainage.  This is from the oxygen  used during your procedure.  There is no need for concern and it should clear up in a day or so.  SYMPTOMS TO REPORT IMMEDIATELY:  Following lower endoscopy (colonoscopy or flexible sigmoidoscopy):  Excessive amounts of blood in the stool  Significant tenderness or worsening of abdominal pains  Swelling of the abdomen that is new, acute  Fever of 100F or higher  Following upper endoscopy (EGD)  Vomiting of blood or coffee ground material  New chest pain or pain under the shoulder blades  Painful or persistently difficult swallowing  New shortness of breath  Fever of 100F or higher  Black, tarry-looking stools  For  urgent or emergent issues, a gastroenterologist can be reached at any hour by calling [phone]. Do not use MyChart messaging for urgent concerns.    DIET:  We do recommend a small meal at first, but then you may proceed to your regular diet.  Drink plenty of fluids but you should avoid alcoholic beverages for 24 hours.  ACTIVITY:  You should plan to take it easy for the rest of today and you should NOT DRIVE or use heavy machinery until tomorrow (because of the sedation medicines used during the test).    FOLLOW UP: Our staff will call the number listed on your records the next business day following your procedure.  We will call around 7:15- 8:00 am to check on you and address any questions or concerns that you may have regarding the information given to you following your procedure. If we do not reach you, we will leave a message.     If any biopsies were taken you will be contacted by phone or by letter within the next 1-3 weeks.  Please call us  at [phone] if you have not heard about the biopsies in 3 weeks.    SIGNATURES/CONFIDENTIALITY: You and/or your care partner have signed paperwork which will be entered into your electronic medical record.  These signatures attest to the fact that that the information above on your After Visit Summary has been reviewed and is understood.  Full responsibility of the confidentiality of this discharge information lies with  you and/or your care-partner.

## 2023-12-09 NOTE — Op Note (Signed)
 [facility] Patient Name: [name] Procedure Date: 12/09/2023 12:00 PM MRN: [id number] Endoscopist: Norleen SAILOR. Abran , MD, [id number] Age: 78 Referring MD:  Date of Birth: 24-Mar-1946 Gender: Male Account #: [id number] Procedure:                Colonoscopy with cold snare polypectomy x 3; biopsy                            polypectomy x 1 Indications:              High risk colon cancer surveillance: Personal                            history of multiple (3 or more) adenomas Medicines:                Monitored Anesthesia Care Procedure:                Pre-Anesthesia Assessment:                           - Prior to the procedure, a History and Physical                            was performed, and patient medications and                            allergies were reviewed. The patient's tolerance of                            previous anesthesia was also reviewed. The risks                            and benefits of the procedure and the sedation                            options and risks were discussed with the patient.                            All questions were answered, and informed consent                            was obtained. Prior Anticoagulants: The patient has                            taken Plavix  (clopidogrel ), last dose was 1 day                            prior to procedure. ASA Grade Assessment: III - A                            patient with severe systemic disease. After                            reviewing the risks and benefits, the patient was  deemed in satisfactory condition to undergo the                            procedure.                           After obtaining informed consent, the colonoscope                            was passed under direct vision. Throughout the                            procedure, the patient's blood pressure, pulse, and                            oxygen  saturations were monitored  continuously. The                            CF [id number] #[id number] was introduced through the anus                            and advanced to the the cecum, identified by                            appendiceal orifice and ileocecal valve. The                            ileocecal valve, appendiceal orifice, and rectum                            were photographed. The quality of the bowel                            preparation was excellent. The colonoscopy was                            performed without difficulty. The patient tolerated                            the procedure well. The bowel preparation used was                            SUPREP via split dose instruction. Scope In: 12:12:39 PM Scope Out: 12:31:52 PM Scope Withdrawal Time: 0 hours 15 minutes 27 seconds  Total Procedure Duration: 0 hours 19 minutes 13 seconds  Findings:                 Three polyps were found in the transverse colon and                            ascending colon. The polyps were 2 to 5 mm in size.                            These polyps were removed with a cold snare.  Resection and retrieval were complete.                           A 1 mm polyp was found in the transverse colon. The                            polyp was removed with a jumbo cold forceps.                            Resection and retrieval were complete.                           Diverticula were found in the left colon.                           The exam was otherwise without abnormality on                            direct and retroflexion views. Complications:            No immediate complications. Estimated blood loss:                            None. Estimated Blood Loss:     Estimated blood loss: none. Impression:               - Three 2 to 5 mm polyps in the transverse colon                            and in the ascending colon, removed with a cold                            snare. Resected and retrieved.                            - One 1 mm polyp in the transverse colon, removed                            with a jumbo cold forceps. Resected and retrieved.                           - Diverticulosis in the left colon.                           - The examination was otherwise normal on direct                            and retroflexion views. Recommendation:           - Repeat colonoscopy is not recommended for                            surveillance.                           - Resume Plavix  (clopidogrel )  today at prior dose.                           - Patient has a contact number available for                            emergencies. The signs and symptoms of potential                            delayed complications were discussed with the                            patient. Return to normal activities tomorrow.                            Written discharge instructions were provided to the                            patient.                           - Resume previous diet.                           - Continue present medications.                           - Await pathology results. Norleen SAILOR. Abran, MD 12/09/2023 12:38:07 PM This report has been signed electronically.

## 2023-12-12 ENCOUNTER — Telehealth: Payer: Self-pay | Admitting: *Deleted

## 2023-12-12 NOTE — Telephone Encounter (Signed)
  Follow up Call-     12/09/2023   11:17 AM  Call back number  Post procedure Call Back phone  # [phone]  Permission to leave phone message Yes     Patient questions:  Do you have a fever, pain , or abdominal swelling? No. Pain Score  0 *  Have you tolerated food without any problems? Yes.    Have you been able to return to your normal activities? Yes.    Do you have any questions about your discharge instructions: Diet   No. Medications  No. Follow up visit  No.  Do you have questions or concerns about your Care? No.  Actions: * If pain score is 4 or above: No action needed, pain <4.

## 2023-12-13 ENCOUNTER — Ambulatory Visit: Payer: Self-pay | Admitting: Internal Medicine

## 2023-12-13 LAB — SURGICAL PATHOLOGY

## 2023-12-15 ENCOUNTER — Other Ambulatory Visit: Payer: Self-pay | Admitting: Internal Medicine

## 2023-12-16 ENCOUNTER — Other Ambulatory Visit: Payer: Self-pay | Admitting: Internal Medicine

## 2023-12-16 DIAGNOSIS — E1136 Type 2 diabetes mellitus with diabetic cataract: Secondary | ICD-10-CM

## 2024-01-12 DIAGNOSIS — D225 Melanocytic nevi of trunk: Secondary | ICD-10-CM | POA: Diagnosis not present

## 2024-01-12 DIAGNOSIS — L720 Epidermal cyst: Secondary | ICD-10-CM | POA: Diagnosis not present

## 2024-01-12 DIAGNOSIS — L570 Actinic keratosis: Secondary | ICD-10-CM | POA: Diagnosis not present

## 2024-01-12 DIAGNOSIS — L821 Other seborrheic keratosis: Secondary | ICD-10-CM | POA: Diagnosis not present

## 2024-01-12 DIAGNOSIS — L920 Granuloma annulare: Secondary | ICD-10-CM | POA: Diagnosis not present

## 2024-01-12 DIAGNOSIS — Z85828 Personal history of other malignant neoplasm of skin: Secondary | ICD-10-CM | POA: Diagnosis not present

## 2024-01-14 ENCOUNTER — Other Ambulatory Visit: Payer: Self-pay | Admitting: Internal Medicine

## 2024-02-22 ENCOUNTER — Other Ambulatory Visit: Payer: Self-pay | Admitting: Internal Medicine

## 2024-06-14 ENCOUNTER — Telehealth: Payer: Self-pay

## 2024-06-14 MED ORDER — ALBUTEROL SULFATE HFA 108 (90 BASE) MCG/ACT IN AERS: 2.0000 | INHALATION_SPRAY | Freq: Four times a day (QID) | RESPIRATORY_TRACT | 11 refills | Status: AC | PRN

## 2024-06-14 NOTE — Telephone Encounter (Signed)
 Copied from CRM #[id number]. Topic: Clinical - Medication Refill >> Jun 14, 2024 12:50 PM Emylou G wrote: Medication: albuterol  (VENTOLIN  HFA) 108 (90 Base) MCG/ACT inhaler  Has the patient contacted their pharmacy? Yes (Agent: If no, request that the patient contact the pharmacy for the refill. If patient does not wish to contact the pharmacy document the reason why and proceed with request.) (Agent: If yes, when and what did the pharmacy advise?) said to call us   This is the patient's preferred pharmacy:  [id number] - [place], [place] - [address] [place] [address] Phone: [phone] Fax: [phone]  Is this the correct pharmacy for this prescription? Yes If no, delete pharmacy and type the correct one.   Has the prescription been filled recently? No  Is the patient out of the medication? Yes  Has the patient been seen for an appointment in the last year OR does the patient have an upcoming appointment? No  Can we respond through MyChart? Yes  Agent: Please be advised that Rx refills may take up to 3 business days. We ask that you follow-up with your pharmacy.
